# Patient Record
Sex: Female | Born: 2000 | Hispanic: Yes | Marital: Single | Smoking: Never smoker
Health system: Southern US, Community
[De-identification: ages and names within clinical notes are randomized; demographics above are authoritative.]

## PROBLEM LIST (undated history)

## (undated) DIAGNOSIS — O139 Gestational [pregnancy-induced] hypertension without significant proteinuria, unspecified trimester: Secondary | ICD-10-CM

## (undated) DIAGNOSIS — E669 Obesity, unspecified: Secondary | ICD-10-CM

## (undated) HISTORY — PX: FRACTURE SURGERY: SHX138

---
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
## 2018-10-25 ENCOUNTER — Encounter: Payer: Self-pay | Admitting: Pediatrics

## 2019-01-03 ENCOUNTER — Encounter: Payer: Self-pay | Admitting: Pediatrics

## 2019-01-03 ENCOUNTER — Ambulatory Visit (INDEPENDENT_AMBULATORY_CARE_PROVIDER_SITE_OTHER): Payer: Medicaid Other | Admitting: Pediatrics

## 2019-01-03 VITALS — BP 108/70 | Ht 63.0 in | Wt 219.2 lb

## 2019-01-03 DIAGNOSIS — Z0000 Encounter for general adult medical examination without abnormal findings: Secondary | ICD-10-CM | POA: Diagnosis not present

## 2019-01-03 DIAGNOSIS — E669 Obesity, unspecified: Secondary | ICD-10-CM | POA: Diagnosis not present

## 2019-01-03 DIAGNOSIS — Z00129 Encounter for routine child health examination without abnormal findings: Secondary | ICD-10-CM

## 2019-01-03 DIAGNOSIS — Z68.41 Body mass index (BMI) pediatric, greater than or equal to 95th percentile for age: Secondary | ICD-10-CM

## 2019-01-03 NOTE — Progress Notes (Signed)
Adolescent Well Care Visit Kim Mejia is a 18 y.o. female who is here for well care.    PCP:  Richrd Sox, MD   History was provided by the patient.  Confidentiality was discussed with the patient and, if applicable, with caregiver as well. Patient's personal or confidential phone number:    Current Issues: Current concerns include none today .   Nutrition: Nutrition/Eating Behaviors: eats a lot of fast food. Drinks sodas and snacks. 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  no Stressors of note: no  Education: School Name: no school she is hoping to attend phlebotomy.   Menstruation:   Patient's last menstrual period was 12/18/2018 (within months). Menstrual History: was irregular last month but never before    Confidential Social History: Tobacco?  no Secondhand smoke exposure?  no Drugs/ETOH?  no  Sexually Active?  no   Pregnancy Prevention: no sex  Safe at home, in school & in relationships?  Yes Safe to self?  Yes   Screenings: Patient has a dental home: yes  The patient completed the Rapid Assessment for Adolescent Preventive Services screening questionnaire and the following topics were identified as risk factors and discussed: healthy eating, exercise, seatbelt use, bullying, tobacco use, marijuana use, drug use, birth control, suicidality/self harm and mental health issues  In addition, the following topics were discussed as part of anticipatory guidance healthy eating, exercise and abuse/trauma.  PHQ-9 completed and results indicated no concerns   Physical Exam:  Vitals:   01/03/19 1102   BP: 108/70  Weight: 219 lb 3.2 oz (99.4 kg)  Height: 5\' 3"  (1.6 m)   BP 108/70   Ht 5\' 3"  (1.6 m)   Wt 219 lb 3.2 oz (99.4 kg)   LMP 12/18/2018 (Within Months)   BMI 38.83 kg/m  Body mass index: body mass index is 38.83 kg/m. Blood pressure percentiles are not available for patients who are 18 years or older.   Hearing Screening   125Hz  250Hz  500Hz  1000Hz  2000Hz  3000Hz  4000Hz  6000Hz  8000Hz   Right ear:   20 20 20 20 20     Left ear:   20 20 20 20 20       Visual Acuity Screening   Right eye Left eye Both eyes  Without correction: 20/20 20/25   With correction:       General Appearance:   alert, oriented, no acute distress  HENT: Normocephalic, no obvious abnormality, conjunctiva clear  Mouth:   Normal appearing teeth, no obvious discoloration, dental caries, or dental caps  Neck:   Supple; thyroid: no enlargement, symmetric, no tenderness/mass/nodules  Chest No masses in breasts  Lungs:   Clear to auscultation bilaterally, normal work of breathing  Heart:   Regular rate and rhythm, S1 and S2 normal, no murmurs;   Abdomen:   Soft, non-tender, no mass, or organomegaly  GU genitalia not examined  Musculoskeletal:   Tone and strength strong and symmetrical, all extremities               Lymphatic:   No cervical adenopathy  Skin/Hair/Nails:   Skin warm, dry and intact, no rashes, no bruises or  petechiae  Neurologic:   Strength, gait, and coordination normal and age-appropriate     Assessment and Plan:  18 yo here for a physical with obesity   BMI is not appropriate for age  Hearing screening result:normal Vision screening result: normal  Counseling provided for all of the vaccine components she is to go to the health department for her vaccines. We discussed this today.  Orders Placed This Encounter  Procedures  . GC/Chlamydia Probe Amp([facility])     Return in 1 year (on 01/04/2020)..  Obesity  Counseling about lifestyle change and eating habits. Treating once a week  no seconds. Exercise 30 minutes a day for 4-5 days a week.  Sleeping 8 hours a night.  Drinking more water.   Richrd Sox, MD

## 2019-01-03 NOTE — Patient Instructions (Signed)
  Place appropriate patient instructions here. 

## 2019-01-05 LAB — GC/CHLAMYDIA PROBE AMP
Chlamydia trachomatis, NAA: NEGATIVE
Neisseria gonorrhoeae by PCR: NEGATIVE

## 2019-04-03 ENCOUNTER — Other Ambulatory Visit: Payer: Self-pay

## 2019-04-03 ENCOUNTER — Emergency Department (HOSPITAL_COMMUNITY)
Admission: EM | Admit: 2019-04-03 | Discharge: 2019-04-03 | Disposition: A | Discharge status: Home / Self Care | Payer: No Typology Code available for payment source | Attending: Emergency Medicine | Admitting: Emergency Medicine

## 2019-04-03 ENCOUNTER — Emergency Department (HOSPITAL_COMMUNITY): Payer: No Typology Code available for payment source

## 2019-04-03 ENCOUNTER — Encounter (HOSPITAL_COMMUNITY): Payer: Self-pay

## 2019-04-03 DIAGNOSIS — Y9389 Activity, other specified: Secondary | ICD-10-CM | POA: Diagnosis not present

## 2019-04-03 DIAGNOSIS — R410 Disorientation, unspecified: Secondary | ICD-10-CM | POA: Insufficient documentation

## 2019-04-03 DIAGNOSIS — Y998 Other external cause status: Secondary | ICD-10-CM | POA: Insufficient documentation

## 2019-04-03 DIAGNOSIS — R51 Headache: Secondary | ICD-10-CM | POA: Diagnosis not present

## 2019-04-03 DIAGNOSIS — Y92410 Unspecified street and highway as the place of occurrence of the external cause: Secondary | ICD-10-CM | POA: Insufficient documentation

## 2019-04-03 DIAGNOSIS — S0990XA Unspecified injury of head, initial encounter: Secondary | ICD-10-CM | POA: Diagnosis not present

## 2019-04-03 DIAGNOSIS — R42 Dizziness and giddiness: Secondary | ICD-10-CM | POA: Diagnosis not present

## 2019-04-03 DIAGNOSIS — Z7722 Contact with and (suspected) exposure to environmental tobacco smoke (acute) (chronic): Secondary | ICD-10-CM | POA: Diagnosis not present

## 2019-04-03 LAB — I-STAT BETA HCG BLOOD, ED (MC, WL, AP ONLY): I-stat hCG, quantitative: <5 m[IU]/mL (ref <5)

## 2019-04-03 MED ORDER — ACETAMINOPHEN 325 MG PO TABS
650.0000 mg | ORAL_TABLET | Freq: Once | ORAL | Status: AC
  Administered 2019-04-03: 650 mg via ORAL
  Filled 2019-04-03: qty 2
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
## 2019-04-30 ENCOUNTER — Encounter (HOSPITAL_COMMUNITY): Payer: Self-pay

## 2019-04-30 ENCOUNTER — Ambulatory Visit (HOSPITAL_COMMUNITY)
Admission: EM | Admit: 2019-04-30 | Discharge: 2019-04-30 | Disposition: A | Discharge status: Home / Self Care | Payer: Medicaid Other | Attending: Family Medicine | Admitting: Family Medicine

## 2019-04-30 ENCOUNTER — Other Ambulatory Visit: Payer: Self-pay

## 2019-04-30 DIAGNOSIS — N760 Acute vaginitis: Secondary | ICD-10-CM

## 2019-04-30 DIAGNOSIS — Z3202 Encounter for pregnancy test, result negative: Secondary | ICD-10-CM

## 2019-04-30 LAB — POCT URINALYSIS DIP (DEVICE)
Bilirubin Urine: NEGATIVE
Glucose, UA: NEGATIVE mg/dL
Hgb urine dipstick: NEGATIVE
Ketones, ur: NEGATIVE mg/dL
Nitrite: NEGATIVE
Protein, ur: NEGATIVE mg/dL
Specific Gravity, Urine: >=1.03 (ref 1.005–1.030)
Urobilinogen, UA: 0.2 mg/dL (ref 0.0–1.0)
pH: 6.5 (ref 5.0–8.0)

## 2019-04-30 LAB — POCT PREGNANCY, URINE: Preg Test, Ur: NEGATIVE

## 2019-04-30 MED ORDER — FLUCONAZOLE 150 MG PO TABS: 150.0000 mg | ORAL_TABLET | Freq: Every day | ORAL | 0 refills | Status: DC

## 2019-04-30 MED ORDER — METRONIDAZOLE 500 MG PO TABS: 500.0000 mg | ORAL_TABLET | Freq: Two times a day (BID) | ORAL | 0 refills | Status: DC

## 2019-04-30 NOTE — Discharge Instructions (Addendum)
Take the metronidazole 2 x a day for a week Take the diflucan now, and repeat in one week We have done additional testing and will call you if anything is positive

## 2019-04-30 NOTE — ED Triage Notes (Signed)
Patient presents to Urgent Care with complaints of vaginal itching since 4 days ago. Patient reports she has had unprotected sex recently, took pregnancy test and it was negative, period is supposed to come this week but has not yet.

## 2019-04-30 NOTE — ED Provider Notes (Signed)
[facility]    CSN: [id number] Arrival date & time: 04/30/19  [id number]     History   Chief Complaint Chief Complaint  Patient presents with  . Appointment  . Vaginal Itching    HPI Kim Mejia is a 18 y.o. female.   HPI  Patient is here for vaginitis.  She has vaginal itching.  No rash or irritation.  She had sexual relations unprotected.  It is with a new boyfriend.  In spite of that she thinks advised to get STD testing.  She declines RPR and HIV against my advice.  It is that she is never had a yeast infection.  States she is never had an STD.  Discussed the importance of safe sex.  Birth control.  Past Medical History:  Diagnosis Date  . Obesity     Patient Active Problem List   Diagnosis Date Noted  . Closed fracture of distal fibula 03/10/2017  . 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 BMI (body mass index), pediatric, 95-99% for age 06/21/2015  . Language barrier 03/21/2015  . Pain of left calf 08/21/2013    Past Surgical History:  Procedure Laterality Date  . ORIF FIBULA FRACTURE Right 03/14/2017   Procedure: RIGHT OPEN REDUCTION INTERNAL FIXATION LATERAL MALLEOLUS;  Surgeon: Eldred MangesMark C Yates, MD;  [facility];  Service: Orthopedics;  Laterality: Right;    OB History   No obstetric history on file.      Home Medications    Prior to Admission medications   Medication Sig Start Date End Date Taking? Authorizing Provider  fluconazole (DIFLUCAN) 150 MG tablet Take 1 tablet (150 mg total) by mouth daily. Repeat in 1 week if needed 04/30/19   Eustace MooreNelson, Sarissa Dern Sue, MD  metroNIDAZOLE (FLAGYL) 500 MG tablet Take 1 tablet (500 mg total) by mouth 2 (two) times daily. 04/30/19   Eustace MooreNelson, Arkin Imran Sue, MD    Family History Family History  Problem Relation Age of Onset  . Hyperlipidemia Mother     Social History Social History   Tobacco Use  . Smoking status: Passive Smoke Exposure - Never Smoker  . Smokeless tobacco: Never Used  . Tobacco  comment: father smokes, visits on weekends  Substance Use Topics  . Alcohol use: No    Alcohol/week: 0.0 standard drinks  . Drug use: No     Allergies   No known allergies   Review of Systems Review of Systems  Constitutional: Negative for chills and fever.  HENT: Negative for ear pain and sore throat.   Eyes: Negative for pain and visual disturbance.  Respiratory: Negative for cough and shortness of breath.   Cardiovascular: Negative for chest pain and palpitations.  Gastrointestinal: Negative for abdominal pain and vomiting.  Genitourinary: Positive for vaginal discharge. Negative for dysuria and hematuria.  Musculoskeletal: Negative for arthralgias and back pain.  Skin: Negative for color change and rash.  Neurological: Negative for seizures and syncope.  All other systems reviewed and are negative.    Physical Exam Triage Vital Signs ED Triage Vitals  Enc Vitals Group     BP 04/30/19 1025 (!) 177/84     Pulse Rate 04/30/19 1025 76     Resp 04/30/19 1025 17     Temp 04/30/19 1025 98.6 F (37 C)     Temp Source 04/30/19 1025 Oral     SpO2 04/30/19 1025 99 %   No data found.  Updated Vital Signs BP (!) 177/84 (BP Location: Left Arm)  Pulse 76   Temp 98.6 F (37 C) (Oral)   Resp 17   LMP 03/30/2019 (Exact Date)   SpO2 99%   Physical Exam Vitals signs and nursing note reviewed.  Constitutional:      General: She is not in acute distress.    Appearance: She is well-developed.  HENT:     Head: Normocephalic and atraumatic.  Eyes:     Conjunctiva/sclera: Conjunctivae normal.  Neck:     Musculoskeletal: Neck supple.  Cardiovascular:     Rate and Rhythm: Normal rate and regular rhythm.     Heart sounds: No murmur.  Pulmonary:     Effort: Pulmonary effort is normal. No respiratory distress.     Breath sounds: Normal breath sounds.  Abdominal:     Palpations: Abdomen is soft.     Tenderness: There is no abdominal tenderness.  Genitourinary:     Comments: Exam deferred Skin:    General: Skin is warm and dry.  Neurological:     General: No focal deficit present.     Mental Status: She is alert.  Psychiatric:        Mood and Affect: Mood normal.      UC Treatments / Results  Labs (all labs ordered are listed, but only abnormal results are displayed) Labs Reviewed  POCT URINALYSIS DIP (DEVICE) - Abnormal; Notable for the following components:      Result Value   Leukocytes,Ua TRACE (*)    All other components within normal limits  POCT PREGNANCY, URINE  POC URINE PREG, ED  CERVICOVAGINAL ANCILLARY ONLY    EKG None  Radiology No results found.  Procedures Procedures (including critical care time)  Medications Ordered in UC Medications - No data to display  Initial Impression / Assessment and Plan / UC Course  I have reviewed the triage vital signs and the nursing notes.  Pertinent labs & imaging results that were available during my care of the patient were reviewed by me and considered in my medical decision making (see chart for details).      Final Clinical Impressions(s) / UC Diagnoses   Final diagnoses:  Vaginitis and vulvovaginitis     Discharge Instructions     Take the metronidazole 2 x a day for a week Take the diflucan now, and repeat in one week We have done additional testing and will call you if anything is positive   ED Prescriptions    Medication Sig Dispense Auth. Provider   metroNIDAZOLE (FLAGYL) 500 MG tablet Take 1 tablet (500 mg total) by mouth 2 (two) times daily. 14 tablet Eustace Moore, MD   fluconazole (DIFLUCAN) 150 MG tablet Take 1 tablet (150 mg total) by mouth daily. Repeat in 1 week if needed 2 tablet Eustace Moore, MD     Controlled Substance Prescriptions [place] Controlled Substance Registry consulted? Not Applicable   Eustace Moore, MD 04/30/19 1531

## 2019-05-01 LAB — CERVICOVAGINAL ANCILLARY ONLY
Chlamydia: NEGATIVE
Neisseria Gonorrhea: NEGATIVE
Trichomonas: NEGATIVE

## 2019-06-15 DIAGNOSIS — Z20828 Contact with and (suspected) exposure to other viral communicable diseases: Secondary | ICD-10-CM | POA: Diagnosis not present

## 2019-07-05 ENCOUNTER — Other Ambulatory Visit: Payer: Self-pay

## 2019-07-05 ENCOUNTER — Ambulatory Visit (INDEPENDENT_AMBULATORY_CARE_PROVIDER_SITE_OTHER): Payer: Medicaid Other | Admitting: Pediatrics

## 2019-07-05 ENCOUNTER — Encounter: Payer: Self-pay | Admitting: Pediatrics

## 2019-07-05 VITALS — BP 112/72 | Ht 63.0 in | Wt 227.0 lb

## 2019-07-05 DIAGNOSIS — L83 Acanthosis nigricans: Secondary | ICD-10-CM

## 2019-07-06 ENCOUNTER — Ambulatory Visit: Payer: Medicaid Other | Admitting: Pediatrics

## 2019-07-06 ENCOUNTER — Encounter: Payer: Self-pay | Admitting: Pediatrics

## 2019-07-06 NOTE — Progress Notes (Signed)
She is here for a weight check. Her weight has increased since her last visit. There have been no changes in lifestyle. She denies excessive thirst, eating, and abdominal pain. No vomiting.    No distress, obese female  Acanthosis nigricans mild on neck  Heart sounds normal, RRR No thyroid palpated  Lungs are clear    18 yo with obesity  Referral to endocrine today  Encouraged lifestyle change No lab work today.  Follow up as needed

## 2019-07-11 ENCOUNTER — Ambulatory Visit (HOSPITAL_COMMUNITY)
Admission: EM | Admit: 2019-07-11 | Discharge: 2019-07-11 | Disposition: A | Discharge status: Home / Self Care | Payer: Medicaid Other | Attending: Urgent Care | Admitting: Urgent Care

## 2019-07-11 ENCOUNTER — Other Ambulatory Visit: Payer: Self-pay

## 2019-07-11 ENCOUNTER — Encounter (HOSPITAL_COMMUNITY): Payer: Self-pay | Admitting: Urgent Care

## 2019-07-11 DIAGNOSIS — R0789 Other chest pain: Secondary | ICD-10-CM

## 2019-07-11 MED ORDER — OMEPRAZOLE 20 MG PO CPDR: 20.0000 mg | DELAYED_RELEASE_CAPSULE | Freq: Every day | ORAL | 0 refills | Status: DC

## 2019-07-11 MED ORDER — FAMOTIDINE 20 MG PO TABS: 20.0000 mg | ORAL_TABLET | Freq: Two times a day (BID) | ORAL | 0 refills | Status: DC

## 2019-07-11 NOTE — ED Triage Notes (Signed)
PT reports central chest pain that sometimes radiates to right shoulder. No pain during triage.

## 2019-07-11 NOTE — ED Notes (Signed)
Patient currently being registered by patient access

## 2019-07-11 NOTE — ED Provider Notes (Addendum)
MRN: [id number] DOB: [birth year]  Subjective:   Kim Mejia is a 18 y.o. female presenting for 2 week history of mid-sternal to right upper sided chest pain that is like a "tightness". Pain is intermittent, daily, mild in nature. Has not tried medications for relief. 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 Does not eat out at restaurants. Denies smoking cigarettes. Denies chronic conditions.    Denies taking any chronic medications.     Allergies  Allergen Reactions  . No Known Allergies     Past Medical History:  Diagnosis Date  . Obesity      Past Surgical History:  Procedure Laterality Date  . ORIF FIBULA FRACTURE Right 03/14/2017   Procedure: RIGHT OPEN REDUCTION INTERNAL FIXATION LATERAL MALLEOLUS;  Surgeon: Marybelle Killings, MD;  Location: [facility];  Service: Orthopedics;  Laterality: Right;    Review of Systems  Constitutional: Negative for fever and malaise/fatigue.  HENT: Negative for congestion, ear pain, sinus pain and sore throat.   Eyes: Negative for blurred vision, double vision, discharge and redness.  Respiratory: Negative for cough, hemoptysis, shortness of breath and wheezing.   Cardiovascular: Positive for chest pain. Negative for palpitations and leg swelling.  Gastrointestinal: Negative for abdominal pain, diarrhea, nausea and vomiting.  Genitourinary: Negative for dysuria, flank pain, frequency, hematuria and urgency.  Musculoskeletal: Negative for back pain and myalgias.  Skin: Negative for rash.  Neurological: Negative for dizziness, weakness and headaches.  Psychiatric/Behavioral: Negative for depression and substance abuse.    Objective:   Vitals: BP 107/69   Pulse 70   Temp 98.6 F (37 C) (Oral)   Resp 16   SpO2 98%   Physical Exam Constitutional:      General: She is not in acute distress.    Appearance: Normal appearance. She is well-developed and normal weight. She is not ill-appearing, toxic-appearing or diaphoretic.  HENT:     Head: Normocephalic  and atraumatic.     Right Ear: External ear normal.     Left Ear: External ear normal.     Nose: Nose normal.     Mouth/Throat:     Mouth: Mucous membranes are moist.     Pharynx: Oropharynx is clear.  Eyes:     General: No scleral icterus.    Extraocular Movements: Extraocular movements intact.     Pupils: Pupils are equal, round, and reactive to light.  Cardiovascular:     Rate and Rhythm: Normal rate and regular rhythm.     Pulses: Normal pulses.     Heart sounds: Normal heart sounds. No murmur. No friction rub. No gallop.   Pulmonary:     Effort: Pulmonary effort is normal. No respiratory distress.     Breath sounds: Normal breath sounds. No stridor. No wheezing, rhonchi or rales.  Abdominal:     General: Bowel sounds are normal. There is no distension.     Palpations: Abdomen is soft. There is no mass.     Tenderness: There is no abdominal tenderness. There is no right CVA tenderness, left CVA tenderness, guarding or rebound.  Skin:    General: Skin is warm and dry.     Coloration: Skin is not pale.     Findings: No rash.  Neurological:     General: No focal deficit present.     Mental Status: She is alert and oriented to person, place, and time.  Psychiatric:        Mood and Affect: Mood normal.        Behavior: Behavior  normal.        Thought Content: Thought content normal.        Judgment: Judgment normal.     Assessment and Plan :   1. Atypical chest pain     Patient has minimal risk factors for cardiopulmonary process.  Counseled that we will manage this for acid reflux with combination of Pepcid and Prilosec which I recommend patient will take more long-term.  Also recommended dietary modifications. Counseled patient on potential for adverse effects with medications prescribed/recommended today, ER and return-to-clinic precautions discussed, patient verbalized understanding.    Wallis BambergMani, Taquan Bralley, [place] JerseyPA-C 07/11/19 1333

## 2019-08-15 ENCOUNTER — Encounter (HOSPITAL_COMMUNITY): Payer: Self-pay | Admitting: Emergency Medicine

## 2019-08-15 ENCOUNTER — Ambulatory Visit (HOSPITAL_COMMUNITY)
Admission: EM | Admit: 2019-08-15 | Discharge: 2019-08-15 | Disposition: A | Discharge status: Home / Self Care | Payer: Medicaid Other | Attending: Family Medicine | Admitting: Family Medicine

## 2019-08-15 ENCOUNTER — Other Ambulatory Visit: Payer: Self-pay

## 2019-08-15 DIAGNOSIS — S46912A Strain of unspecified muscle, fascia and tendon at shoulder and upper arm level, left arm, initial encounter: Secondary | ICD-10-CM | POA: Diagnosis not present

## 2019-08-15 MED ORDER — CYCLOBENZAPRINE HCL 10 MG PO TABS: 10.0000 mg | ORAL_TABLET | Freq: Three times a day (TID) | ORAL | 0 refills | Status: DC | PRN

## 2019-08-15 NOTE — ED Triage Notes (Signed)
Pt here for left shoulder pain upon waking today

## 2019-08-15 NOTE — ED Provider Notes (Signed)
[facility]    CSN: [id number] Arrival date & time: 08/15/19  1800      History   Chief Complaint Chief Complaint  Patient presents with  . Appointment    5:50  . Shoulder Pain    HPI Kim Mejia is a 18 y.o. female.   Patient complains of pain in her left shoulder but upon questioning her pain is primarily in the muscle between her neck and shoulder, the trapezius muscle.  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  There is been no history of any injury or trauma or other unusual activity.  HPI  Past Medical History:  Diagnosis Date  . Obesity     Patient Active Problem List   Diagnosis Date Noted  . Closed fracture of distal fibula 03/10/2017  . 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 BMI (body mass index), pediatric, 95-99% for age 53/25/2016  . Language barrier 03/21/2015  . Pain of left calf 08/21/2013    Past Surgical History:  Procedure Laterality Date  . ORIF FIBULA FRACTURE Right 03/14/2017   Procedure: RIGHT OPEN REDUCTION INTERNAL FIXATION LATERAL MALLEOLUS;  Surgeon: Eldred MangesMark C Yates, MD;  Location[facility];  Service: Orthopedics;  Laterality: Right;    OB History   No obstetric history on file.      Home Medications    Prior to Admission medications   Medication Sig Start Date End Date Taking? Authorizing Provider  famotidine (PEPCID) 20 MG tablet Take 1 tablet (20 mg total) by mouth 2 (two) times daily. 07/11/19   Wallis BambergMani, Mario, PA-C  omeprazole (PRILOSEC) 20 MG capsule Take 1 capsule (20 mg total) by mouth daily. 07/11/19   Wallis BambergMani, Mario, PA-C    Family History Family History  Problem Relation Age of Onset  . Hyperlipidemia Mother     Social History Social History   Tobacco Use  . Smoking status: Passive Smoke Exposure - Never Smoker  . Smokeless tobacco: Never Used  . Tobacco comment: father smokes, visits on weekends  Substance Use Topics  . Alcohol use: No    Alcohol/week: 0.0 standard drinks  . Drug use: No      Allergies   No known allergies   Review of Systems Review of Systems  Musculoskeletal: Positive for myalgias.  All other systems reviewed and are negative.    Physical Exam Triage Vital Signs ED Triage Vitals [08/15/19 1813]  Enc Vitals Group     BP 130/87     Pulse Rate 84     Resp 18     Temp 98.7 F (37.1 C)     Temp Source Oral     SpO2 99 %     Weight      Height      Head Circumference      Peak Flow      Pain Score 7     Pain Loc      Pain Edu?      Excl. in GC?    No data found.  Updated Vital Signs BP 130/87 (BP Location: Left Arm)   Pulse 84   Temp 98.7 F (37.1 C) (Oral)   Resp 18   SpO2 99%   Visual Acuity Right Eye Distance:   Left Eye Distance:   Bilateral Distance:    Right Eye Near:   Left Eye Near:    Bilateral Near:     Physical Exam Constitutional:      Appearance: Normal appearance. She is  normal weight.  Musculoskeletal:     Comments: Left shoulder: Normal contour Normal range of motion and strength.  Tenderness maximal in the left trapezius muscle going into her neck.  Reflexes are symmetric in the upper extremities but there is some suggestion of possible early carpal tunnel syndrome on the left.  Neurological:     Mental Status: She is alert.      UC Treatments / Results  Labs (all labs ordered are listed, but only abnormal results are displayed) Labs Reviewed - No data to display  EKG   Radiology No results found.  Procedures Procedures (including critical care time)  Medications Ordered in UC Medications - No data to display  Initial Impression / Assessment and Plan / UC Course  I have reviewed the triage vital signs and the nursing notes.  Pertinent labs & imaging results that were available during my care of the patient were reviewed by me and considered in my medical decision making (see chart for details).     Left trapezius muscle strain.  Have recommended Biofreeze massage along with isometric  exercises and will prescribe muscle relaxant Final Clinical Impressions(s) / UC Diagnoses   Final diagnoses:  None   Discharge Instructions   None    ED Prescriptions    None     Controlled Substance Prescriptions [place] Controlled Substance Registry consulted? No   Wardell Honour, MD 08/15/19 1901

## 2019-09-14 DIAGNOSIS — Z3009 Encounter for other general counseling and advice on contraception: Secondary | ICD-10-CM | POA: Diagnosis not present

## 2019-09-14 DIAGNOSIS — Z3201 Encounter for pregnancy test, result positive: Secondary | ICD-10-CM | POA: Diagnosis not present

## 2019-09-14 DIAGNOSIS — Z113 Encounter for screening for infections with a predominantly sexual mode of transmission: Secondary | ICD-10-CM | POA: Diagnosis not present

## 2019-09-25 ENCOUNTER — Inpatient Hospital Stay (HOSPITAL_COMMUNITY)
Admission: EM | Admit: 2019-09-25 | Discharge: 2019-09-25 | Disposition: A | Discharge status: Home / Self Care | Payer: Medicaid Other | Attending: Family Medicine | Admitting: Family Medicine

## 2019-09-25 ENCOUNTER — Encounter (HOSPITAL_COMMUNITY): Payer: Self-pay

## 2019-09-25 ENCOUNTER — Inpatient Hospital Stay (HOSPITAL_COMMUNITY): Payer: Medicaid Other

## 2019-09-25 ENCOUNTER — Other Ambulatory Visit: Payer: Self-pay

## 2019-09-25 DIAGNOSIS — R109 Unspecified abdominal pain: Secondary | ICD-10-CM | POA: Insufficient documentation

## 2019-09-25 DIAGNOSIS — O9989 Other specified diseases and conditions complicating pregnancy, childbirth and the puerperium: Secondary | ICD-10-CM | POA: Insufficient documentation

## 2019-09-25 DIAGNOSIS — O3680X0 Pregnancy with inconclusive fetal viability, not applicable or unspecified: Secondary | ICD-10-CM

## 2019-09-25 DIAGNOSIS — O209 Hemorrhage in early pregnancy, unspecified: Secondary | ICD-10-CM | POA: Diagnosis not present

## 2019-09-25 DIAGNOSIS — O99211 Obesity complicating pregnancy, first trimester: Secondary | ICD-10-CM | POA: Diagnosis not present

## 2019-09-25 DIAGNOSIS — O4690 Antepartum hemorrhage, unspecified, unspecified trimester: Secondary | ICD-10-CM

## 2019-09-25 DIAGNOSIS — Z3A08 8 weeks gestation of pregnancy: Secondary | ICD-10-CM | POA: Diagnosis not present

## 2019-09-25 DIAGNOSIS — Z79899 Other long term (current) drug therapy: Secondary | ICD-10-CM | POA: Diagnosis not present

## 2019-09-25 DIAGNOSIS — E669 Obesity, unspecified: Secondary | ICD-10-CM | POA: Insufficient documentation

## 2019-09-25 DIAGNOSIS — Z3A01 Less than 8 weeks gestation of pregnancy: Secondary | ICD-10-CM | POA: Diagnosis not present

## 2019-09-25 DIAGNOSIS — O4691 Antepartum hemorrhage, unspecified, first trimester: Secondary | ICD-10-CM

## 2019-09-25 LAB — CBC
HCT: 35.9 % — ABNORMAL LOW (ref 36.0–46.0)
Hemoglobin: 11.9 g/dL — ABNORMAL LOW (ref 12.0–15.0)
MCH: 29.7 pg (ref 26.0–34.0)
MCHC: 33.1 g/dL (ref 30.0–36.0)
MCV: 89.5 fL (ref 80.0–100.0)
Platelets: 317 10*3/uL (ref 150–400)
RBC: 4.01 MIL/uL (ref 3.87–5.11)
RDW: 13 % (ref 11.5–15.5)
WBC: 12.5 10*3/uL — ABNORMAL HIGH (ref 4.0–10.5)
nRBC: 0 % (ref 0.0–0.2)

## 2019-09-25 LAB — URINALYSIS, ROUTINE W REFLEX MICROSCOPIC
Bilirubin Urine: NEGATIVE
Glucose, UA: NEGATIVE mg/dL
Ketones, ur: NEGATIVE mg/dL
Leukocytes,Ua: NEGATIVE
Nitrite: NEGATIVE
Protein, ur: 30 mg/dL — AB
Specific Gravity, Urine: 1.03 (ref 1.005–1.030)
pH: 5 (ref 5.0–8.0)

## 2019-09-25 LAB — ABO/RH: ABO/RH(D): O POS

## 2019-09-25 LAB — WET PREP, GENITAL
Clue Cells Wet Prep HPF POC: NONE SEEN
Sperm: NONE SEEN
Trich, Wet Prep: NONE SEEN
Yeast Wet Prep HPF POC: NONE SEEN

## 2019-09-25 LAB — POCT PREGNANCY, URINE: Preg Test, Ur: POSITIVE — AB

## 2019-09-25 NOTE — MAU Note (Signed)
Pt reports she was laying down at 2030 and noticed she was having period like bleeding. No recent falls. Last intercourse was 9/26.

## 2019-09-25 NOTE — MAU Provider Note (Signed)
History     CSN: [id number]  Arrival date and time: 09/25/19 2053   First Provider Initiated Contact with Patient 09/25/19 2216      Chief Complaint  Patient presents with  . Vaginal Bleeding    Kim Mejia is a 18 y.o. [name] at [redacted]w[redacted]d by unsure of LMP of July 31, 2019 who has not established care.  She presents today for Vaginal Bleeding.  She states she was laying down and felt wetness and noted blood.  She states this occurred around 830pm and she continued to note blood upon arrival.  Patient reports the blood was initially dark red, but is now more pinkish red with an increased amt. Patient denies any cramping, but reports some "shocking" pain on her right side that is intermittent that she rates as 2/10.  Patient endorses sexual activity on the 26th and denies pain and bleeding after intercourse.       OB History    Gravida  1   Para      Term      Preterm      AB      Living        SAB      TAB      Ectopic      Multiple      Live Births              Past Medical History:  Diagnosis Date  . Obesity     Past Surgical History:  Procedure Laterality Date  . ORIF FIBULA FRACTURE Right 03/14/2017   Procedure: RIGHT OPEN REDUCTION INTERNAL FIXATION LATERAL MALLEOLUS;  Surgeon: Marybelle Killings, MD;  Location: [facility];  Service: Orthopedics;  Laterality: Right;    Family History  Problem Relation Age of Onset  . Hyperlipidemia Mother     Social History   Tobacco Use  . Smoking status: Never Smoker  . Smokeless tobacco: Never Used  . Tobacco comment: father smokes, visits on weekends  Substance Use Topics  . Alcohol use: No    Alcohol/week: 0.0 standard drinks  . Drug use: No    Allergies:  Allergies  Allergen Reactions  . No Known Allergies     Medications Prior to Admission  Medication Sig Dispense Refill Last Dose  . Prenatal Vit-Fe Fumarate-FA (MULTIVITAMIN-PRENATAL) 27-0.8 MG TABS tablet Take 1 tablet by mouth daily at 12 noon.    09/25/2019 at Unknown time  . cyclobenzaprine (FLEXERIL) 10 MG tablet Take 1 tablet (10 mg total) by mouth 3 (three) times daily as needed for muscle spasms. 20 tablet 0   . famotidine (PEPCID) 20 MG tablet Take 1 tablet (20 mg total) by mouth 2 (two) times daily. 60 tablet 0   . omeprazole (PRILOSEC) 20 MG capsule Take 1 capsule (20 mg total) by mouth daily. 90 capsule 0     Review of Systems  Constitutional: Negative for chills and fever.  Respiratory: Negative for cough and shortness of breath.   Gastrointestinal: Positive for abdominal pain (Right Side), nausea and vomiting. Negative for constipation and diarrhea.  Genitourinary: Positive for vaginal bleeding. Negative for difficulty urinating, dysuria, pelvic pain and vaginal discharge.  Neurological: Negative for dizziness, light-headedness and headaches.   Physical Exam   Blood pressure 118/70, pulse 74, temperature 98.2 F (36.8 C), temperature source Oral, resp. rate 17, last menstrual period 07/31/2019, SpO2 100 %.  Physical Exam  Constitutional: She is oriented to person, place, and time.  Obese  HENT:  Head: Normocephalic and  atraumatic.  Eyes: Conjunctivae are normal.  Neck: Normal range of motion.  Cardiovascular: Normal rate.  Respiratory: Effort normal.  GI: Soft.  Genitourinary: Cervix exhibits no friability.    Vaginal bleeding present.  There is bleeding in the vagina.    Genitourinary Comments:  Speculum Exam: -Normal External Genitalia: Non tender, Scant amt bloody mucoid discharge noted at introitus.  -Vaginal Vault: Pink mucosa with good rugae. Scant amt dark red blood -wet prep collected -Cervix:Pink, no lesions, cysts, or polyps.  Appears closed. Blood noted at os-GC/CT collected -Bimanual Exam:  Closed   Neurological: She is alert and oriented to person, place, and time.  Skin: Skin is warm and dry.  Psychiatric: She has a normal mood and affect. Her behavior is normal.    MAU Course   Procedures Results for orders placed or performed during the hospital encounter of 09/25/19 (from the past 24 hour(s))  Urinalysis, Routine w reflex microscopic     Status: Abnormal   Collection Time: 09/25/19  9:45 PM  Result Value Ref Range   Color, Urine YELLOW YELLOW   APPearance CLEAR CLEAR   Specific Gravity, Urine 1.030 1.005 - 1.030   pH 5.0 5.0 - 8.0   Glucose, UA NEGATIVE NEGATIVE mg/dL   Hgb urine dipstick LARGE (A) NEGATIVE   Bilirubin Urine NEGATIVE NEGATIVE   Ketones, ur NEGATIVE NEGATIVE mg/dL   Protein, ur 30 (A) NEGATIVE mg/dL   Nitrite NEGATIVE NEGATIVE   Leukocytes,Ua NEGATIVE NEGATIVE   RBC / HPF 6-10 0 - 5 RBC/hpf   WBC, UA 0-5 0 - 5 WBC/hpf   Bacteria, UA RARE (A) NONE SEEN   Squamous Epithelial / LPF 0-5 0 - 5   Mucus PRESENT   Pregnancy, urine POC     Status: Abnormal   Collection Time: 09/25/19  9:54 PM  Result Value Ref Range   Preg Test, Ur POSITIVE (A) NEGATIVE  Wet prep, genital     Status: Abnormal   Collection Time: 09/25/19 10:32 PM   Specimen: Cervical/Vaginal swab  Result Value Ref Range   Yeast Wet Prep HPF POC NONE SEEN NONE SEEN   Trich, Wet Prep NONE SEEN NONE SEEN   Clue Cells Wet Prep HPF POC NONE SEEN NONE SEEN   WBC, Wet Prep HPF POC FEW (A) NONE SEEN   Sperm NONE SEEN   CBC     Status: Abnormal   Collection Time: 09/25/19 10:43 PM  Result Value Ref Range   WBC 12.5 (H) 4.0 - 10.5 K/uL   RBC 4.01 3.87 - 5.11 MIL/uL   Hemoglobin 11.9 (L) 12.0 - 15.0 g/dL   HCT 58.0 (L) 99.8 - 33.8 %   MCV 89.5 80.0 - 100.0 fL   MCH 29.7 26.0 - 34.0 pg   MCHC 33.1 30.0 - 36.0 g/dL   RDW 25.0 53.9 - 76.7 %   Platelets 317 150 - 400 K/uL   nRBC 0.0 0.0 - 0.2 %  ABO/Rh     Status: None   Collection Time: 09/25/19 10:43 PM  Result Value Ref Range   ABO/RH(D) O POS    No rh immune globuloin      NOT A RH IMMUNE GLOBULIN CANDIDATE, PT RH POSITIVE Performed at [facility], [address], [place], [place] [address]    US Ob Less Than 14  Weeks With Ob Transvaginal  Result Date: 09/25/2019 CLINICAL DATA:  Pregnant patient in first-trimester pregnancy with vaginal bleeding. Pregnancy of unknown anatomic location. EXAM: OBSTETRIC <14  WK US AND TRANSVAGINAL OB US TECHNIQUE: Both transabdominal and transvaginal ultrasound examinations were performed for complete evaluation of the gestation as well as the maternal uterus, adnexal regions, and pelvic cul-de-sac. Transvaginal technique was performed to assess early pregnancy. COMPARISON:  None. FINDINGS: Intrauterine gestational sac: Single Yolk sac:  Visualized. Embryo:  Visualized. Cardiac Activity: Visualized. Heart Rate: 164 bpm CRL:  14 mm   7 w   4 d                  US EDC: 05/09/2020 Subchorionic hemorrhage:  None visualized. Maternal uterus/adnexae: Both ovaries are well visualized and are normal. Small corpus luteal cyst in the left ovary. Normal ovarian blood flow. No pelvic free fluid or adnexal mass. IMPRESSION: Single live intrauterine pregnancy estimated gestational age [redacted] weeks 4 days based [facility] crown[facility]rump [facility]. No subchorionic hemorrhage. Electronically Signed   By: Narda RutherfordMelanie  Sanford M.D.   On: 09/25/2019 23:20    MDM  Pelvic Exam; Wet Prep and GC/CT Labs: UA, UPT, CBC, hCG, ABO Ultrasound Assessment and Plan  18 year old, [name]  SIUP at 8 weeks by Unsure LMP Abdominal Pain Vaginal Bleeding Pregnancy of Unknown Anatomical Location  -Reviewed POC with patient. -Patient introduced to instruments used for pelvic exam. -Exam performed and findings discussed.  -Patient without questions or concerns.  -Will send for US -Will reassess.   Cherre RobinsJessica L Najir Roop MSN, CNM 09/25/2019, 10:16 PM   Reassessment (11:28 PM) SIUP at 7.4 weeks  -US results as above, No SCH noted. -Wet prep returns with insignificant findings. -Lab and US results discussed with patient. -Informed that GC/CT will return within 2-3 days. -hCG pending, but IUP with HR  noted. -Patient without questions or concerns.  -Nurse instructed to give Bleeding Precautions. -Vaginal bleeding information and provider list given in discharge paperwork.  -Encouraged to call or return to MAU if symptoms worsen or with the onset of new symptoms. -Discharged to home in stable condition.  Cherre RobinsJessica L Thaxton Pelley MSN, CNM Advanced Practice Provider, [facility]

## 2019-09-25 NOTE — Discharge Instructions (Signed)
[facility] Prenatal Care Providers   Center for [facility] Healthcare at [facility]       Phone: [phone]  Center for [facility] Healthcare at [place] Phone: [phone]  Center for [employer] at [place]  Phone: [phone]  Center for [facility] at [facility]  Phone: [phone]  Center for [facility] Healthcare at [facility]  Phone: [phone]  [facility]       Phone: [phone]  [facility]    Phone: [phone]   [facility])    Phone: [phone]  [facility]    Phone: [phone]  [facility]    Phone: [phone]   [facility]  Phone: [phone]  [facility]    Phone: [phone]  [facility]   Phone: [phone]  [facility]    Phone: [phone]  Vaginal Bleeding During Pregnancy, First Trimester  A small amount of bleeding from the vagina (spotting) is relatively common during early pregnancy. It usually stops on its own. Various things may cause bleeding or spotting during early pregnancy. Some bleeding may be related to the pregnancy, and some may not. In many cases, the bleeding is normal and is not a problem. However, bleeding can also be a sign of something serious. Be sure to tell your health care provider about any vaginal bleeding right away. Some possible causes of vaginal bleeding during the first trimester include:  Infection or inflammation of the cervix.  Growths (polyps) on the cervix.  Miscarriage or threatened miscarriage.  Pregnancy tissue developing outside of the uterus (ectopic pregnancy).  A mass of tissue developing in the uterus due to an egg being fertilized incorrectly (molar pregnancy). Follow these instructions at home: Activity  Follow instructions from your health care provider about  limiting your activity. Ask what activities are safe for you.  If needed, make plans for someone to help with your regular activities.  Do not have sex or orgasms until your health care provider says that this is safe. General instructions  Take over-the-counter and prescription medicines only as told by your health care provider.  Pay attention to any changes in your symptoms.  Do not use tampons or douche.  Write down how many pads you use each day, how often you change pads, and how soaked (saturated) they are.  If you pass any tissue from your vagina, save the tissue so you can show it to your health care provider.  Keep all follow-up visits as told by your health care provider. This is important. Contact a health care provider if:  You have vaginal bleeding during any part of your pregnancy.  You have cramps or labor pains.  You have a fever. Get help right away if:  You have severe cramps in your back or abdomen.  You pass large clots or a large amount of tissue from your vagina.  Your bleeding increases.  You feel light-headed or weak, or you faint.  You have chills.  You are leaking fluid or have a gush of fluid from your vagina. Summary  A small amount of bleeding (spotting) from the vagina is relatively common during early pregnancy.  Various things may cause bleeding or spotting in early pregnancy.  Be sure to tell your health care provider about any vaginal bleeding right away. This information is not intended to replace advice given to you by your health care provider. Make sure you discuss any questions  you have with your health care provider. Document Released: 09/22/2005 Document Revised: 04/03/2019 Document Reviewed: 03/17/2017 Elsevier Patient Education  2020 [name].

## 2019-09-26 LAB — HCG, QUANTITATIVE, PREGNANCY: hCG, Beta Chain, Quant, S: 73005 m[IU]/mL — ABNORMAL HIGH (ref <5)

## 2019-09-27 LAB — GC/CHLAMYDIA PROBE AMP (~~LOC~~) NOT AT ARMC
Chlamydia: NEGATIVE
Molecular Disclaimer: NEGATIVE
Molecular Disclaimer: NORMAL
Neisseria Gonorrhea: NEGATIVE

## 2019-10-02 ENCOUNTER — Other Ambulatory Visit: Payer: Self-pay

## 2019-10-02 ENCOUNTER — Ambulatory Visit: Payer: Medicaid Other

## 2019-10-02 DIAGNOSIS — Z3480 Encounter for supervision of other normal pregnancy, unspecified trimester: Secondary | ICD-10-CM | POA: Insufficient documentation

## 2019-10-02 MED ORDER — BLOOD PRESSURE CUFF MISC: 1.0000 | 0 refills | Status: DC

## 2019-10-02 NOTE — Progress Notes (Signed)
PRENATAL INTAKE SUMMARY  Ms. Doebler presents today over the phone for New OB Nurse Interview.  OB History    Gravida  1   Para      Term      Preterm      AB      Living        SAB      TAB      Ectopic      Multiple      Live Births             I have reviewed the patient's medical, obstetrical, social, and family histories, medications, and available lab results.  SUBJECTIVE She has no unusual complaints  OBJECTIVE Initial Physical Exam (New OB)  ASSESSMENT Normal pregnancy, pt had one episode of bleeding on 09/25/19. Pt reports this has resolved and she hasn't had any more bleeding. Pt instructed to return to [facility]U if any more bleeding occurs, pt verbalizes understanding.   [place] on 09/25/19 gave EDC as 05/09/20 LMP of 07/31/19 gave EDD 05/06/20

## 2019-10-03 DIAGNOSIS — I10 Essential (primary) hypertension: Secondary | ICD-10-CM | POA: Diagnosis not present

## 2019-10-12 ENCOUNTER — Encounter: Payer: Self-pay | Admitting: Nurse Practitioner

## 2019-10-12 ENCOUNTER — Ambulatory Visit (INDEPENDENT_AMBULATORY_CARE_PROVIDER_SITE_OTHER): Payer: Medicaid Other | Admitting: Nurse Practitioner

## 2019-10-12 ENCOUNTER — Other Ambulatory Visit: Payer: Self-pay

## 2019-10-12 VITALS — BP 109/66 | HR 73 | Temp 98.2°F | Wt 228.0 lb

## 2019-10-12 DIAGNOSIS — Z3480 Encounter for supervision of other normal pregnancy, unspecified trimester: Secondary | ICD-10-CM | POA: Diagnosis not present

## 2019-10-12 DIAGNOSIS — Z6841 Body Mass Index (BMI) 40.0 and over, adult: Secondary | ICD-10-CM

## 2019-10-12 DIAGNOSIS — O2341 Unspecified infection of urinary tract in pregnancy, first trimester: Secondary | ICD-10-CM

## 2019-10-12 DIAGNOSIS — O219 Vomiting of pregnancy, unspecified: Secondary | ICD-10-CM

## 2019-10-12 DIAGNOSIS — Z3A10 10 weeks gestation of pregnancy: Secondary | ICD-10-CM

## 2019-10-12 DIAGNOSIS — Z3481 Encounter for supervision of other normal pregnancy, first trimester: Secondary | ICD-10-CM

## 2019-10-12 DIAGNOSIS — S82821A Torus fracture of lower end of right fibula, initial encounter for closed fracture: Secondary | ICD-10-CM

## 2019-10-12 MED ORDER — VITAFOL ULTRA 29-0.6-0.4-200 MG PO CAPS: 1.0000 | ORAL_CAPSULE | Freq: Every day | ORAL | 11 refills | Status: AC

## 2019-10-12 MED ORDER — DOXYLAMINE-PYRIDOXINE 10-10 MG PO TBEC: DELAYED_RELEASE_TABLET | ORAL | 2 refills | Status: DC

## 2019-10-12 NOTE — Patient Instructions (Signed)
Safe Medications in Pregnancy  ° °Acne: °Benzoyl Peroxide °Salicylic Acid ° °Backache/Headache: °Tylenol: 2 regular strength every 4 hours OR °             2 Extra strength every 6 hours ° °Colds/Coughs/Allergies: °Benadryl (alcohol free) 25 mg every 6 hours as needed °Breath right strips °Claritin °Cepacol throat lozenges °Chloraseptic throat spray °Cold-Eeze- up to three times per day °Cough drops, alcohol free °Flonase  °Guaifenesin °Mucinex °Robitussin DM (plain only, alcohol free) °Saline nasal spray/drops °Sudafed (pseudoephedrine) & Actifed ** use only after [redacted] weeks gestation and if you do not have high blood pressure °Tylenol °Vicks Vaporub °Zinc lozenges °Zyrtec  ° °Constipation: °Colace °Ducolax suppositories °Fleet enema °Glycerin suppositories °Metamucil °Milk of magnesia °Miralax °Senokot °Smooth move tea ° °Diarrhea: °Kaopectate °Imodium A-D ° °*NO pepto Bismol ° °Hemorrhoids: °Anusol °Anusol HC °Preparation H °Tucks ° °Indigestion: °Tums °Maalox °Mylanta °Zantac  °Pepcid ° °Insomnia: °Benadryl (alcohol free) 25mg every 6 hours as needed °Tylenol PM °Unisom, no Gelcaps ° °Leg Cramps: °Tums °MagGel ° °Nausea/Vomiting:  °Bonine °Dramamine °Emetrol °Ginger extract °Sea bands °Meclizine  °Nausea medication to take during pregnancy:  °Unisom (doxylamine succinate 25 mg tablets) Take one tablet daily at bedtime. If symptoms are not adequately controlled, the dose can be increased to a maximum recommended dose of two tablets daily (1/2 tablet in the morning, 1/2 tablet mid-afternoon and one at bedtime). °Vitamin B6 100mg tablets. Take one tablet twice a day (up to 200 mg per day). ° °Skin Rashes: °Aveeno products °Benadryl cream or 25mg every 6 hours as needed °Calamine Lotion °1% cortisone cream ° °Yeast infection: °Gyne-lotrimin 7 °Monistat 7 ° ° °**If taking multiple medications, please check labels to avoid duplicating the same active ingredients °**take medication as directed on the label °** Do not  exceed 4000 mg of tylenol in 24 hours °**Do not take medications that contain aspirin or ibuprofen ° °  °

## 2019-10-12 NOTE — Progress Notes (Signed)
Subjective:   Kim Mejia is a 18 y.o. [name] at [redacted]w[redacted]d by early ultrasound being seen today for her first obstetrical visit.  Her obstetrical history is significant for obesity. Patient does intend to breast feed. Pregnancy history fully reviewed.  Patient reports nausea and vomiting.  HISTORY: OB History  Gravida Para Term Preterm AB Living  1 0 0 0 0 0  SAB TAB Ectopic Multiple Live Births  0 0 0 0 0    # Outcome Date GA Lbr Len/2nd Weight Sex Delivery Anes PTL Lv  1 Current            Past Medical History:  Diagnosis Date  . Obesity    Past Surgical History:  Procedure Laterality Date  . ORIF FIBULA FRACTURE Right 03/14/2017   Procedure: RIGHT OPEN REDUCTION INTERNAL FIXATION LATERAL MALLEOLUS;  Surgeon: Marybelle Killings, MD;  Location: [facility];  Service: Orthopedics;  Laterality: Right;   Family History  Problem Relation Age of Onset  . Hyperlipidemia Mother    Social History   Tobacco Use  . Smoking status: Never Smoker  . Smokeless tobacco: Never Used  . Tobacco comment: father smokes, visits on weekends  Substance Use Topics  . Alcohol use: No    Alcohol/week: 0.0 standard drinks  . Drug use: No   Allergies  Allergen Reactions  . No Known Allergies    Current Outpatient Medications on File Prior to Visit  Medication Sig Dispense Refill  . Blood Pressure Monitoring (BLOOD PRESSURE CUFF) MISC 1 Device by Does not apply route once a week. 1 each 0  . Prenatal Vit-Fe Fumarate-FA (MULTIVITAMIN-PRENATAL) 27-0.8 MG TABS tablet Take 1 tablet by mouth daily at 12 noon.     No current facility-administered medications on file prior to visit.      Exam   Vitals:   10/12/19 1046  BP: 109/66  Pulse: 73  Temp: 98.2 F (36.8 C)  Weight: 228 lb (103.4 kg)   Fetal Heart Rate (bpm): 134  Uterus:  Fundal Height: 10 cm  Pelvic Exam: Perineum: Deferred - GC/Chlam done 2 weeks ago   Vulva:    Vagina:     Cervix:    Adnexa:    Bony Pelvis:   System: General:  well-developed, well-nourished female in no acute distress   Breast:  normal appearance, no masses or tenderness   Skin: normal coloration and turgor, no rashes   Neurologic: oriented, normal, negative, normal mood   Extremities: normal strength, tone, and muscle mass, ROM of all joints is normal   HEENT extraocular movement intact and sclera clear, anicteric   Mouth/Teeth mucous membranes moist, pharynx normal without lesions and dental hygiene good   Neck supple and no masses, normal thyroid   Cardiovascular: regular rate and rhythm   Respiratory:  no respiratory distress, normal breath sounds   Abdomen: soft, non-tender; no masses,  no organomegaly     Assessment:   Pregnancy: [name] Patient Active Problem List   Diagnosis Date Noted  . Supervision of other normal pregnancy, antepartum 10/02/2019  . Closed fracture of distal fibula 03/10/2017  . 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 BMI (body mass index), pediatric, 95-99% for age 51/25/2016  . Language barrier 03/21/2015  . Pain of left calf 08/21/2013     Plan:  1. Supervision of other normal pregnancy, antepartum She wants to return next week for flu vaccine Prescribed Diclegis for nausea and vomiting. Prescribed prenatal vitamins - client may not  realize - did this after she left. Discussed babyscripts and MyChart Advised childbirth and breasfeeding online classes - referred to babyscripts for more info. - Enroll Patient in Babyscripts - Babyscripts Schedule Optimization - Obstetric Panel, Including HIV - Culture, OB Urine - Genetic Screening  2. BMI 40.0-44.9, adult (HCC) Advised weight loss of 11-20 pounds  Initial labs drawn. Continue prenatal vitamins. Genetic Screening discussed, Panorama and Horizon: ordered. Ultrasound discussed; fetal anatomic survey: discussed and will order at next visit. Problem list reviewed and updated. The nature of [facility] with  multiple MDs and other Advanced Practice Providers was explained to patient; also emphasized that residents, students are part of our team. Routine obstetric precautions reviewed. Return in about 1 week (around 10/19/2019) for RN visit for flu shot and in 4 weeks for virtual ROB.  Total face-to-face time with patient: 40 minutes.  Over 50% of encounter was spent on counseling and coordination of care.     Nolene Bernheim, FNP Family Nurse Practitioner, [facility] for [employer], [facility] 10/12/2019 11:31 AM

## 2019-10-12 NOTE — Progress Notes (Signed)
New OB.  C/o NV.  Patient declined Flu vaccine.

## 2019-10-13 LAB — OBSTETRIC PANEL, INCLUDING HIV
Antibody Screen: NEGATIVE
Basophils Absolute: 0 10*3/uL (ref 0.0–0.2)
Basos: 0 %
EOS (ABSOLUTE): 0.1 10*3/uL (ref 0.0–0.4)
Eos: 1 %
HIV Screen 4th Generation wRfx: NONREACTIVE
Hematocrit: 35.7 % (ref 34.0–46.6)
Hemoglobin: 11.7 g/dL (ref 11.1–15.9)
Hepatitis B Surface Ag: NEGATIVE
Immature Grans (Abs): 0 10*3/uL (ref 0.0–0.1)
Immature Granulocytes: 0 %
Lymphocytes Absolute: 1.6 10*3/uL (ref 0.7–3.1)
Lymphs: 16 %
MCH: 28.8 pg (ref 26.6–33.0)
MCHC: 32.8 g/dL (ref 31.5–35.7)
MCV: 88 fL (ref 79–97)
Monocytes Absolute: 0.7 10*3/uL (ref 0.1–0.9)
Monocytes: 7 %
Neutrophils Absolute: 7.6 10*3/uL — ABNORMAL HIGH (ref 1.4–7.0)
Neutrophils: 76 %
Platelets: 283 10*3/uL (ref 150–450)
RBC: 4.06 x10E6/uL (ref 3.77–5.28)
RDW: 12.8 % (ref 11.7–15.4)
RPR Ser Ql: NONREACTIVE
Rh Factor: POSITIVE
Rubella Antibodies, IGG: 2.09 index (ref >0.99)
WBC: 10 10*3/uL (ref 3.4–10.8)

## 2019-10-15 DIAGNOSIS — O2341 Unspecified infection of urinary tract in pregnancy, first trimester: Secondary | ICD-10-CM | POA: Insufficient documentation

## 2019-10-15 LAB — URINE CULTURE, OB REFLEX

## 2019-10-15 LAB — CULTURE, OB URINE

## 2019-10-15 MED ORDER — CEPHALEXIN 500 MG PO CAPS: 500.0000 mg | ORAL_CAPSULE | Freq: Four times a day (QID) | ORAL | 0 refills | Status: AC

## 2019-10-15 NOTE — Addendum Note (Signed)
Addended by: Virginia Rochester on: 10/15/2019 08:23 AM   Modules accepted: Orders

## 2019-10-15 NOTE — Progress Notes (Signed)
UTI noted on urine culture.  Keflex sent to her pharmacy and clinical staff to notify her as MyChart is not active.  Earlie Server, RN, MSN, NP-BC Nurse Practitioner, [facility] for [employer], [place] [facility] 10/15/2019 8:21 AM

## 2019-10-18 ENCOUNTER — Telehealth: Payer: Self-pay | Admitting: Licensed Clinical Social Worker

## 2019-10-18 NOTE — Telephone Encounter (Signed)
Patient confirmed scheduled nurse visit. Patient reports no birth control history and declined birth control after she delivers.

## 2019-10-19 ENCOUNTER — Other Ambulatory Visit: Payer: Self-pay

## 2019-10-19 ENCOUNTER — Ambulatory Visit (INDEPENDENT_AMBULATORY_CARE_PROVIDER_SITE_OTHER): Payer: Medicaid Other

## 2019-10-19 DIAGNOSIS — Z23 Encounter for immunization: Secondary | ICD-10-CM | POA: Diagnosis not present

## 2019-10-19 NOTE — Progress Notes (Signed)
Pt presents to the clinic for a flu shot.  Pt tolerated injection well in the LD without complications. -EH/RMA

## 2019-10-22 ENCOUNTER — Encounter: Payer: Self-pay | Admitting: Obstetrics and Gynecology

## 2019-10-23 ENCOUNTER — Encounter: Payer: Self-pay | Admitting: Obstetrics and Gynecology

## 2019-10-23 DIAGNOSIS — O09899 Supervision of other high risk pregnancies, unspecified trimester: Secondary | ICD-10-CM | POA: Insufficient documentation

## 2019-10-24 ENCOUNTER — Encounter: Payer: Self-pay | Admitting: Obstetrics and Gynecology

## 2019-10-29 ENCOUNTER — Other Ambulatory Visit: Payer: Self-pay

## 2019-10-29 ENCOUNTER — Encounter: Payer: Self-pay | Admitting: *Deleted

## 2019-10-29 ENCOUNTER — Other Ambulatory Visit (HOSPITAL_COMMUNITY)
Admission: RE | Admit: 2019-10-29 | Discharge: 2019-10-29 | Disposition: A | Discharge status: Home / Self Care | Payer: Medicaid Other | Source: Ambulatory Visit | Attending: Obstetrics | Admitting: Obstetrics

## 2019-10-29 ENCOUNTER — Ambulatory Visit: Payer: Medicaid Other | Admitting: *Deleted

## 2019-10-29 DIAGNOSIS — Z3480 Encounter for supervision of other normal pregnancy, unspecified trimester: Secondary | ICD-10-CM

## 2019-10-29 DIAGNOSIS — N898 Other specified noninflammatory disorders of vagina: Secondary | ICD-10-CM | POA: Diagnosis not present

## 2019-10-29 NOTE — Progress Notes (Signed)
Pt is in office for Self Swab today due to vaginal itching.  Pt made aware testing for BV and Yeast and cultures to be sent today.  Treatment will be based on results.  Pt advised to call office if any other concerns.   Pt is doing well and has no other complaints today.

## 2019-10-30 LAB — CERVICOVAGINAL ANCILLARY ONLY
Bacterial Vaginitis (gardnerella): POSITIVE — AB
Candida Glabrata: NEGATIVE
Candida Vaginitis: POSITIVE — AB
Chlamydia: NEGATIVE
Comment: NEGATIVE
Comment: NEGATIVE
Comment: NEGATIVE
Comment: NEGATIVE
Comment: NEGATIVE
Comment: NORMAL
Neisseria Gonorrhea: NEGATIVE
Trichomonas: NEGATIVE

## 2019-10-31 ENCOUNTER — Other Ambulatory Visit: Payer: Self-pay | Admitting: Obstetrics

## 2019-10-31 ENCOUNTER — Telehealth: Payer: Self-pay

## 2019-10-31 DIAGNOSIS — B373 Candidiasis of vulva and vagina: Secondary | ICD-10-CM

## 2019-10-31 DIAGNOSIS — B3731 Acute candidiasis of vulva and vagina: Secondary | ICD-10-CM

## 2019-10-31 DIAGNOSIS — B9689 Other specified bacterial agents as the cause of diseases classified elsewhere: Secondary | ICD-10-CM

## 2019-10-31 DIAGNOSIS — N760 Acute vaginitis: Secondary | ICD-10-CM

## 2019-10-31 MED ORDER — TERCONAZOLE 0.4 % VA CREA: 1.0000 | TOPICAL_CREAM | Freq: Every day | VAGINAL | 0 refills | Status: DC

## 2019-10-31 MED ORDER — TINIDAZOLE 500 MG PO TABS: 1000.0000 mg | ORAL_TABLET | Freq: Every day | ORAL | 2 refills | Status: DC

## 2019-10-31 NOTE — Telephone Encounter (Signed)
Patient notified of results and RX 

## 2019-11-09 ENCOUNTER — Encounter: Payer: Medicaid Other | Admitting: Family Medicine

## 2019-11-13 ENCOUNTER — Ambulatory Visit (INDEPENDENT_AMBULATORY_CARE_PROVIDER_SITE_OTHER): Payer: Medicaid Other | Admitting: Nurse Practitioner

## 2019-11-13 ENCOUNTER — Encounter: Payer: Self-pay | Admitting: Nurse Practitioner

## 2019-11-13 DIAGNOSIS — Z3480 Encounter for supervision of other normal pregnancy, unspecified trimester: Secondary | ICD-10-CM

## 2019-11-13 DIAGNOSIS — O09899 Supervision of other high risk pregnancies, unspecified trimester: Secondary | ICD-10-CM

## 2019-11-13 DIAGNOSIS — O09892 Supervision of other high risk pregnancies, second trimester: Secondary | ICD-10-CM

## 2019-11-13 DIAGNOSIS — O2342 Unspecified infection of urinary tract in pregnancy, second trimester: Secondary | ICD-10-CM

## 2019-11-13 DIAGNOSIS — Z3A14 14 weeks gestation of pregnancy: Secondary | ICD-10-CM

## 2019-11-13 DIAGNOSIS — O2341 Unspecified infection of urinary tract in pregnancy, first trimester: Secondary | ICD-10-CM

## 2019-11-13 NOTE — Progress Notes (Signed)
I connected with@ on 11/13/19 at  3:55 PM EST by: Mychart and verified that I am speaking with the correct person using two identifiers.  Patient is located at home and provider is located at [place] office.     The purpose of this virtual visit is to provide medical care while limiting exposure to the novel coronavirus. I discussed the limitations, risks, security and privacy concerns of performing an evaluation and management service by Earlie Server, NP and the availability of in person appointments. I also discussed with the patient that there may be a patient responsible charge related to this service. By engaging in this virtual visit, you consent to the provision of healthcare.  Additionally, you authorize for your insurance to be billed for the services provided during this visit.  The patient expressed understanding and agreed to proceed.  The following staff members participated in the virtual visit:  Wilhemina Bonito, CMA and Earlie Server, NP    PRENATAL VISIT NOTE  Subjective:  Kim Mejia is a 18 y.o. [name] at [redacted]w[redacted]d  for phone visit for ongoing prenatal care.  She is currently monitored for the following issues for this low-risk pregnancy and has Pain of left calf; BMI (body mass index), pediatric, 95-99% for age; Language barrier; Claudication of calf muscles left; Closed fracture of distal fibula; Supervision of other normal pregnancy, antepartum; UTI in pregnancy, antepartum, first trimester; and Cystic fibrosis carrier, antepartum on their problem list.  Patient reports periodic lower abdominal pain. No bleeding.  No vaginal discharge. Contractions: Not present. Vag. Bleeding: None.  Movement: Absent. Denies leaking of fluid.   The following portions of the patient's history were reviewed and updated as appropriate: allergies, current medications, past family history, past medical history, past social history, past surgical history and problem list.   Objective:  There were no  vitals filed for this visit. Self-Obtained  Fetal Status:     Movement: Absent     Assessment and Plan:  Pregnancy: [name] at [redacted]w[redacted]d 1. Supervision of other normal pregnancy, antepartum lower abd pain - advised to drink more fluids and use tylenol.  It may be her uterus growing or round ligament pain.  If it is worsening, will need to come for urine specimen to check for UTI.  If any bleeding develops, she will need to be seen in the office.  Reviewed increasing fluids and dietary fiber to hopefully have a BM daily. From HX, had UTI in early pregnancy - will set up lab visit to drop off urine for culture again. BP cuff is not working.  To bring to next visit. Next visit in person to get AFP and have BP cuff checked at [redacted]w[redacted]d Client is set up to do [place] visits   - [place] MFM OB  + 14 WK; Future - detailed - Ambulatory referral to Genetics for CF carrier status  Preterm labor symptoms and general obstetric precautions including but not limited to vaginal bleeding, contractions, leaking of fluid and fetal movement were reviewed in detail with the patient.  Return in about 3 weeks (around 12/04/2019) for in person ROB needs AFP drawn and bring BP cuff..  Future Appointments  Date Time Provider [place]  12/18/2019  8:00 AM [facility]  12/18/2019  8:00 AM [facility]-MFC [place] 3 [facility]-MFCUS MFC-US  12/18/2019  9:00 AM [facility] GENETIC COUNSELING [facility] [place] M[facility]-US     Time spent on virtual visit: 12 minutes  Earlie Server, RN, MSN, NP-BC Nurse Practitioner, [facility]  [facility], [employer] [facility] 11/13/2019 4:44 PM

## 2019-11-13 NOTE — Progress Notes (Signed)
Virtual Visit via Telephone Note  I connected with Kim Mejia on 11/13/19 at  3:55 PM EST by telephone and verified that I am speaking with the correct person using two identifiers.   Pt c/o intermittent pain under navel area.  BP cuff not working per pt.

## 2019-11-14 ENCOUNTER — Ambulatory Visit: Payer: Medicaid Other

## 2019-11-14 ENCOUNTER — Other Ambulatory Visit: Payer: Self-pay

## 2019-11-14 DIAGNOSIS — R300 Dysuria: Secondary | ICD-10-CM

## 2019-11-14 NOTE — Progress Notes (Signed)
Pt is here to leave a urine specimen to send off for culture. Pt had virtual visit with provider yesterday and complained of dysuria. Pt advised she will be made aware once UC is back, pt verbalizes understanding.

## 2019-11-14 NOTE — Addendum Note (Signed)
Addended by: Lewie Loron D on: 11/14/2019 11:35 AM   Modules accepted: Orders

## 2019-11-16 LAB — URINE CULTURE, OB REFLEX

## 2019-11-16 LAB — CULTURE, OB URINE

## 2019-12-04 ENCOUNTER — Other Ambulatory Visit: Payer: Self-pay

## 2019-12-04 ENCOUNTER — Ambulatory Visit (INDEPENDENT_AMBULATORY_CARE_PROVIDER_SITE_OTHER): Payer: Medicaid Other | Admitting: Advanced Practice Midwife

## 2019-12-04 VITALS — BP 106/70 | HR 76 | Wt 226.6 lb

## 2019-12-04 DIAGNOSIS — Z3A17 17 weeks gestation of pregnancy: Secondary | ICD-10-CM

## 2019-12-04 DIAGNOSIS — O99212 Obesity complicating pregnancy, second trimester: Secondary | ICD-10-CM | POA: Diagnosis not present

## 2019-12-04 DIAGNOSIS — Z3480 Encounter for supervision of other normal pregnancy, unspecified trimester: Secondary | ICD-10-CM

## 2019-12-04 DIAGNOSIS — O99210 Obesity complicating pregnancy, unspecified trimester: Secondary | ICD-10-CM | POA: Insufficient documentation

## 2019-12-04 DIAGNOSIS — Z3A19 19 weeks gestation of pregnancy: Secondary | ICD-10-CM

## 2019-12-04 NOTE — Progress Notes (Signed)
Pt presents for ROB. Pt has no concerns today. Pt has already received her flu vaccine.

## 2019-12-04 NOTE — Patient Instructions (Signed)
Call Kim Mejia to schedule appointment with a genetic counselor at [phone].     Healthy Weight Gain During Pregnancy, Adult A certain amount of weight gain during pregnancy is normal and healthy. How much weight you should gain depends on your overall health and a measurement called BMI (body mass index). BMI is an estimate of your body fat based on your height and weight. You can use an Software engineer to figure out your BMI, or you can ask your health care provider to calculate it for you at your next visit. Your recommended pregnancy weight gain is based on your pre-pregnancy BMI. General guidelines for a healthy total weight gain during pregnancy are listed below. If your BMI at or before the start of your pregnancy is:  Less than 18.5 (underweight), you should gain 28-40 lb (13-18 kg).  18.5-24.9 (normal weight), you should gain 25-35 lb (11-16 kg).  25-29.9 (overweight), you should gain 15-25 lb (7-11 kg).  30 or higher (obese), you should gain 11-20 lb (5-9 kg). These ranges vary depending on your individual health. If you are carrying more than one baby (multiples), it may be safe to gain more weight than these recommendations. If you gain less weight than recommended, that may be safe as long as your baby is growing and developing normally. How can unhealthy weight gain affect me and my baby? Gaining too much weight during pregnancy can lead to pregnancy complications, such as:  A temporary form of diabetes that develops during pregnancy (gestational diabetes).  High blood pressure during pregnancy and protein in your urine (preeclampsia).  High blood pressure during pregnancy without protein in your urine (gestational hypertension).  Your baby having a high weight at birth, which may: ? Raise your risk of having a more difficult delivery or a surgical delivery (cesarean delivery, or C-section). ? Raise your child's risk of developing obesity during childhood. Not gaining  enough weight can be life-threatening for your baby, and it may raise your baby's chances of:  Being born early (preterm).  Growing more slowly than normal during pregnancy (growth restriction).  Having a low weight at birth. What actions can I take to gain a healthy amount of weight during pregnancy? General instructions  Keep track of your weight gain during pregnancy.  Take over-the-counter and prescription medicines only as told by your health care provider. Take all prenatal supplements as directed.  Keep all health care visits during pregnancy (prenatal visits). These visits are a good time to discuss your weight gain. Your health care provider will weigh you at each visit to make sure you are gaining a healthy amount of weight. Nutrition   Eat a balanced, nutrient-rich diet. Eat plenty of: ? Fruits and vegetables, such as berries and broccoli. ? Whole grains, such as millet, barley, whole-wheat breads and cereals, and oatmeal. ? Low-fat dairy products or non-dairy products such as almond milk or rice milk. ? Protein foods, such as lean meat, chicken, eggs, and legumes (such as peas, beans, soybeans, and lentils).  Avoid foods that are fried or have a lot of fat, salt (sodium), or sugar.  Drink enough fluid to keep your urine pale yellow.  Choose healthy snack and drink options when you are at work or on the go: ? Drink water. Avoid soda, sports drinks, and juices that have added sugar. ? Avoid drinks with caffeine, such as coffee and energy drinks. ? Eat snacks that are high in protein, such as nuts, protein bars, and low-fat yogurt. ? Carry  convenient snacks in your purse that do not need refrigeration, such as a pack of trail mix, an apple, or a granola bar.  If you need help improving your diet, work with a health care provider or a diet and nutrition specialist (dietitian). Activity   Exercise regularly, as told by your health care provider. ? If you were active  before becoming pregnant, you may be able to continue your regular fitness activities. ? If you were not active before pregnancy, you may gradually build up to exercising for 30 or more minutes on most days of the week. This may include walking, swimming, or yoga.  Ask your health care provider what activities are safe for you. Talk with your health care provider about whether you may need to be excused from certain school or work activities. Where to find more information Learn more about managing your weight gain during pregnancy from:  American Pregnancy Association: www.americanpregnancy.org  U.S. Department of Agriculture pregnancy weight gain calculator: FormerBoss.no Summary  Too much weight gain during pregnancy can lead to complications for you and your baby.  Find out your pre-pregnancy BMI to determine how much weight gain is healthy for you.  Eat nutritious foods and stay active.  Keep all of your prenatal visits as told by your health care provider. This information is not intended to replace advice given to you by your health care provider. Make sure you discuss any questions you have with your health care provider. Document Released: 09/02/2017 Document Revised: 09/02/2017 Document Reviewed: 09/02/2017 Elsevier Patient Education  2020 [name].

## 2019-12-04 NOTE — Progress Notes (Signed)
   PRENATAL VISIT NOTE  Subjective:  Kim Mejia is a 18 y.o. [name] at 9w4dbeing seen today for ongoing prenatal care.  She is currently monitored for the following issues for this low-risk pregnancy and has Pain of left calf; BMI (body mass index), pediatric, 95-99% for age; Language barrier; Claudication of calf muscles left; Closed fracture of distal fibula; Supervision of other normal pregnancy, antepartum; UTI in pregnancy, antepartum, first trimester; Cystic fibrosis carrier, antepartum; and Obesity in pregnancy on their problem list.  Patient reports no complaints.  Contractions: Not present.  .  Movement: Present. Denies leaking of fluid.   The following portions of the patient's history were reviewed and updated as appropriate: allergies, current medications, past family history, past medical history, past social history, past surgical history and problem list.   Objective:   Vitals:   12/04/19 1023  BP: 106/70  Pulse: 76  Weight: 226 lb 9.6 oz (102.8 kg)    Fetal Status: Fetal Heart Rate (bpm): 145   Movement: Present     General:  Alert, oriented and cooperative. Patient is in no acute distress.  Skin: Skin is warm and dry. No rash noted.   Cardiovascular: Normal heart rate noted  Respiratory: Normal respiratory effort, no problems with respiration noted  Abdomen: Soft, gravid, appropriate for gestational age.  Pain/Pressure: Absent     Pelvic: Cervical exam deferred        Extremities: Normal range of motion.  Edema: None  Mental Status: Normal mood and affect. Normal behavior. Normal judgment and thought content.   Assessment and Plan:  Pregnancy: [name] at [redacted]w[redacted]d. Obesity affecting pregnancy in second trimester --Discussed increased risks with pt, and recommendation for baseline labs, baby ASA, and nutrition consult.  - Consult to dietitian - Hemoglobin A1c - Comp Met (CMET) - Protein / creatinine ratio, urine  2. Supervision of other normal pregnancy,  antepartum --Discussed normal changes during second trimester of pregnancy --Anticipatory guidance about next visits/weeks of pregnancy given. --Next visit in 4 weeks, virtual  Return in about 4 weeks (around 01/01/2020).  Future Appointments  Date Time Provider [place]/22/2020  8:00 AM [place]  12/18/2019  8:00 AM WH-MFC [place] WH-MFCUS MFC-US  12/18/2019  9:00 AM WH[place]arnakENETIC COUNSELING RM WHPlymouthFC-US    LiFatima BlankCNM

## 2019-12-05 LAB — COMPREHENSIVE METABOLIC PANEL
ALT: 7 IU/L (ref 0–32)
AST: 9 IU/L (ref 0–40)
Albumin/Globulin Ratio: 1.3 (ref 1.2–2.2)
Albumin: 3.6 g/dL — ABNORMAL LOW (ref 3.9–5.0)
Alkaline Phosphatase: 96 IU/L (ref 39–117)
BUN/Creatinine Ratio: 9 (ref 9–23)
BUN: 5 mg/dL — ABNORMAL LOW (ref 6–20)
Bilirubin Total: <0.2 mg/dL (ref 0.0–1.2)
CO2: 21 mmol/L (ref 20–29)
Calcium: 8.9 mg/dL (ref 8.7–10.2)
Chloride: 101 mmol/L (ref 96–106)
Creatinine, Ser: 0.56 mg/dL — ABNORMAL LOW (ref 0.57–1.00)
GFR calc Af Amer: 155 mL/min/{1.73_m2} (ref >59)
GFR calc non Af Amer: 135 mL/min/{1.73_m2} (ref >59)
Globulin, Total: 2.8 g/dL (ref 1.5–4.5)
Glucose: 79 mg/dL (ref 65–99)
Potassium: 3.9 mmol/L (ref 3.5–5.2)
Sodium: 136 mmol/L (ref 134–144)
Total Protein: 6.4 g/dL (ref 6.0–8.5)

## 2019-12-05 LAB — PROTEIN / CREATININE RATIO, URINE
Creatinine, Urine: 180.1 mg/dL
Protein, Ur: 12 mg/dL
Protein/Creat Ratio: 67 mg/g creat (ref 0–200)

## 2019-12-05 LAB — HEMOGLOBIN A1C
Est. average glucose Bld gHb Est-mCnc: 91 mg/dL
Hgb A1c MFr Bld: 4.8 % (ref 4.8–5.6)

## 2019-12-18 ENCOUNTER — Ambulatory Visit (HOSPITAL_COMMUNITY): Payer: Medicaid Other | Admitting: *Deleted

## 2019-12-18 ENCOUNTER — Ambulatory Visit (HOSPITAL_COMMUNITY)
Admission: RE | Admit: 2019-12-18 | Discharge: 2019-12-18 | Disposition: A | Discharge status: Home / Self Care | Payer: Medicaid Other | Source: Ambulatory Visit | Attending: Obstetrics and Gynecology | Admitting: Obstetrics and Gynecology

## 2019-12-18 ENCOUNTER — Ambulatory Visit (HOSPITAL_BASED_OUTPATIENT_CLINIC_OR_DEPARTMENT_OTHER): Payer: Medicaid Other | Admitting: Genetic Counselor

## 2019-12-18 ENCOUNTER — Encounter (HOSPITAL_COMMUNITY): Payer: Self-pay

## 2019-12-18 ENCOUNTER — Other Ambulatory Visit: Payer: Self-pay

## 2019-12-18 ENCOUNTER — Ambulatory Visit (HOSPITAL_COMMUNITY): Payer: Self-pay | Admitting: Genetic Counselor

## 2019-12-18 ENCOUNTER — Other Ambulatory Visit (HOSPITAL_COMMUNITY): Payer: Self-pay | Admitting: *Deleted

## 2019-12-18 DIAGNOSIS — Z3480 Encounter for supervision of other normal pregnancy, unspecified trimester: Secondary | ICD-10-CM | POA: Diagnosis not present

## 2019-12-18 DIAGNOSIS — O09899 Supervision of other high risk pregnancies, unspecified trimester: Secondary | ICD-10-CM

## 2019-12-18 DIAGNOSIS — Z141 Cystic fibrosis carrier: Secondary | ICD-10-CM | POA: Insufficient documentation

## 2019-12-18 DIAGNOSIS — Z3A19 19 weeks gestation of pregnancy: Secondary | ICD-10-CM

## 2019-12-18 DIAGNOSIS — O99210 Obesity complicating pregnancy, unspecified trimester: Secondary | ICD-10-CM

## 2019-12-18 DIAGNOSIS — O09892 Supervision of other high risk pregnancies, second trimester: Secondary | ICD-10-CM

## 2019-12-18 DIAGNOSIS — O99212 Obesity complicating pregnancy, second trimester: Secondary | ICD-10-CM

## 2019-12-18 DIAGNOSIS — Z315 Encounter for genetic counseling: Secondary | ICD-10-CM | POA: Diagnosis not present

## 2019-12-18 NOTE — Progress Notes (Signed)
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 Kim Mejia came to her appointment alone due to COVID-19 visitor restrictions. Second year [address] of [address] genetic counseling student Kim Mejia assisted in this session under my supervision.  Indication for genetic counseling - Carrier for cystic fibrosis  Prenatal history  Kim Mejia is a G83P0, 18 y.o. female. Her current pregnancy has completed [redacted]w[redacted]d (Estimated Date of Delivery: 05/09/20).  Kim Mejia denied exposure to environmental toxins or chemical agents. She denied the use of alcohol, tobacco or street drugs. She reported taking prenatal vitamins. She denied significant viral illnesses, fevers, and bleeding during the course of her pregnancy. Her medical and surgical histories were noncontributory.  Family History  A three generation pedigree was drafted and reviewed. The family history is remarkable for the following:  - Kim Mejia has a paternal uncle who has a daughter and son with epilepsy diagnosed in adolescence. Seizures can occur secondary to a variety of environmental, lifestyle, and genetic factors. When epilepsy does not have an identified genetic cause, the chance that a Mejia degree relative of someone with epilepsy will also have or develop epilepsy is approximately 2-5%. Given that Kim Mejia's cousins are fourth degree relatives to her fetus, recurrence risk for epilepsy is unlikely to be elevated over the general population risk of 1%. However, without knowing the etiology of the seizures in the family, precise risk assessment is limited.  - Kim Mejia's partner, Kim Mejia, was born with a kidney abnormality that required surgery at birth. His medical and developmental histories are otherwise noncontributory. We reviewed  that kidney disease and renal anomalies can occur due to a variety of environmental, lifestyle, and genetic factors. More than 60 genetic diseases are currently known to directly or indirectly affect the kidneys. While some inherited conditions are associated with only mild symptoms, others can cause severe health problems. Given that Kim Mejia does not have any other medical or developmental problems, his renal anomaly is likely isolated. Congenital renal anomalies occur in approximately 3-7 out of 1000 live births. 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   The remaining family histories were reviewed and found to be noncontributory for birth defects, intellectual disability, recurrent pregnancy loss, and known genetic conditions.    The patient's ethnicity is [place]. The father of the pregnancy's ethnicity is [place]. Ashkenazi Jewish ancestry and consanguinity were denied. Pedigree will be scanned under Media.  Discussion  Kim Mejia had Horizon-14 carrier screening through the laboratory [facility]. 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 CF is a condition characterized by the buildup of thick, sticky mucus that can damage the body's organs. Mucus is a slippery substance that lubricates and protects the linings of the airways, digestive system, reproductive system, and other organs and tissues. Individuals with CF have abnormally sticky mucus that can clog the airways and digestive system, leading to progressive damage to the respiratory system and chronic digestive system problems. The most common features of CF include respiratory difficulties, bacterial infections in the lungs, the formation of scar tissue (fibrosis) and cysts in  the lungs, pancreatic insufficiency, CF-related diabetes mellitus, diarrhea,  malnutrition, poor growth, and weight loss. Most men with CF have congenital bilateral absence of the vas deferens (CBAVD) which causes female infertility. CF is variably expressed, meaning features of the condition and their severity vary among affected individuals.   CF is caused by pathogenic variants in the CFTR gene. This gene provides instructions for the creation of a channel that transports chloride ions into and out of cells. The flow of chloride ions helps control the movement of water in the body's tissues, which is necessary for the production of thin, freely flowing mucus. Pathogenic variants in the CFTR gene disrupt the function of the chloride channels, preventing them from regulating the flow of chloride ions and water across cell membranes. 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 The current fetus is only at risk for CF if Kim Mejia's partner is also a carrier for the condition. Based on the carrier frequency for CF in the Hispanic population, Kim Mejia's partner has a 1 in 3958 chance of being a carrier for CF. If Kim Mejia's partner is also a carrier, the risk for CF in the current fetus would be 1 in 4 (25%).   Kim Mejia's carrier screening was negative for the other 13 conditions screened. Thus, her risk to be a carrier for these additional conditions (listed separately in the laboratory report) has been reduced but not eliminated. This also significantly reduces her risk of having a child affected by one of these conditions. We discussed that carrier testing for cystic fibrosis is recommended for Kim Mejia's partner. Kim Mejia indicated that she is interested in pursuing partner carrier screening.  We also reviewed that Kim Mejia had Panorama NIPS through the laboratory Kim Mejia that was low-risk for fetal aneuploidies. We  reviewed that these results showed a less than 1 in 10,000 risk for trisomies 21, 18 and 13, and monosomy X (Turner syndrome).  In addition, the risk for triploidy and sex chromosome trisomies (47,XXX and 47,XXY) was also low. Kim Mejia elected to have cfDNA analysis for 22q11.2 deletion syndrome, which was also low risk (1 in 2900). We reviewed that while this testing identifies 94-99% of pregnancies with trisomy 2321, trisomy 3413, trisomy 4618, sex chromosome aneuploidies, and triploidy, it is NOT diagnostic. A positive test result requires confirmation by CVS or amniocentesis, and a negative test result does not rule out a fetal chromosome abnormality. She also understands that this testing does not identify all genetic conditions.  A complete ultrasound was performed today prior to our visit. The ultrasound report will be sent under separate cover. There were no visualized fetal anomalies or markers suggestive of aneuploidy.   Kim Mejia was also counseled regarding diagnostic testing via amniocentesis. We discussed the technical aspects of the procedure and quoted up to a 1 in 500 (0.2%) risk for spontaneous pregnancy loss or other adverse pregnancy outcomes as a result of amniocentesis. Cultured cells from an amniocentesis sample allow for the visualization of a fetal karyotype, which can detect >99% of chromosomal aberrations. Chromosomal microarray can also be performed to identify smaller deletions or duplications of fetal chromosomal material. Amniocentesis could also be performed to assess whether the baby is affected by CF. After careful consideration, Ms. Chenette declined amniocentesis at this time. She understands that amniocentesis is available at any point after 16 weeks of pregnancy and that she may opt to undergo the procedure at a later date should she change her mind.  Lastly, the patient was made aware that screening for open neural tube defects (ONTDs) via MS-AFP in the second trimester in  addition to level II ultrasound examination is recommended. We reviewed that Ms. Swartout's level II ultrasound did not detect any ONTDs, and that level II ultrasound is able to detect them with 90-95% sensitivity. However, normal results from any of the above options do not guarantee a normal baby, as 3-5% of newborns have some type of birth defect, many of which are not prenatally diagnosable.  Ms. Hegwood was interested in pursuing carrier screening for CF for her partner, Rueben Bash; however, he does not have health insurance. We discussed that I may be able to facilitate free testing for him through [facility]'s Patient Assistance Program. We made a plan for me to contact her after the holidays so I can coordinate carrier screening for her partner. Ms. Hardie was agreeable to this plan.  I counseled Ms. Devivo regarding the above risks and available options. The approximate face-to-face time with the genetic counselor was 40 minutes.  In summary:  Discussed carrier screening results and options for follow-up testing  Carrier for cystic fibrosis  Desires partner carrier screening. I will contact Ms. Delawder after the holidays to coordinate this  Reviewed low-risk NIPS results  Reduction in risk for Down syndrome,trisomy 18,trisomy 46, sex chromosome aneuploidies, and 22q11.2deletion syndrome  Reviewed results of ultrasound  No fetal anomalies or markers seen  Reduction in risk for fetal aneuploidy  Offered additional testing and screening  Declined amniocentesis  Recommend MS-AFP screening  Reviewed family history concerns   Buelah Manis, MS Genetic Counselor

## 2019-12-28 NOTE — L&D Delivery Note (Addendum)
Patient is 19 y.o. [name] [redacted]w[redacted]d admitted 5/16 for IOL for gHTN and NRNST, hx of GBS positive, progressed with cytotec, FB, pitocin and AROM augmentation.    Delivery Note At 7:20 AM a viable and healthy female was delivered via Vaginal, Spontaneous (Presentation: Right Occiput Anterior).  APGAR: 8, 9, weight pending.  After 2 minute delay, cord clamped, cut by support person, and blood was collected by provider.  Placenta delivered via Tomasa Blase, noted to be intact with 3VC upon inspection.  Long strand of membrane noted on vaginal inspection and removed in whole with ringed forceps.  Vaginal inspection revealed 2nd degree perineal laceration with small hemostatic periurethral abrasion.  The lacteration was repaired with 3.0 vicryl.    Anesthesia: Epidural Episiotomy:  None Lacerations:  2nd degree Suture Repair: 3.0 vicryl Est. Blood Loss (mL): 538  Mom to postpartum.  Baby to Couplet care / Skin to Skin.  Solmon Ice Meccariello 05/12/2020, 7:33 AM   I was gloved and present for delivery in its entirety.  Second stage of labor progressed, baby delivered after 1.5 hours of pushing. Delivery call made due to thick meconium. Infant immediately crying with good tone.   Complications: none  Lacerations: 2nd degree perineal  Rolm Bookbinder, CNM 7:58 AM

## 2020-01-01 ENCOUNTER — Telehealth (INDEPENDENT_AMBULATORY_CARE_PROVIDER_SITE_OTHER): Payer: Medicaid Other | Admitting: Advanced Practice Midwife

## 2020-01-01 DIAGNOSIS — Z3A21 21 weeks gestation of pregnancy: Secondary | ICD-10-CM

## 2020-01-01 DIAGNOSIS — O26892 Other specified pregnancy related conditions, second trimester: Secondary | ICD-10-CM

## 2020-01-01 DIAGNOSIS — R109 Unspecified abdominal pain: Secondary | ICD-10-CM

## 2020-01-01 DIAGNOSIS — O09899 Supervision of other high risk pregnancies, unspecified trimester: Secondary | ICD-10-CM

## 2020-01-01 DIAGNOSIS — O99210 Obesity complicating pregnancy, unspecified trimester: Secondary | ICD-10-CM

## 2020-01-01 DIAGNOSIS — O99212 Obesity complicating pregnancy, second trimester: Secondary | ICD-10-CM

## 2020-01-01 DIAGNOSIS — O09892 Supervision of other high risk pregnancies, second trimester: Secondary | ICD-10-CM

## 2020-01-01 DIAGNOSIS — Z3480 Encounter for supervision of other normal pregnancy, unspecified trimester: Secondary | ICD-10-CM

## 2020-01-01 MED ORDER — COMFORT FIT MATERNITY SUPP MED MISC: 1.0000 | Freq: Every day | 0 refills | Status: DC

## 2020-01-01 NOTE — Progress Notes (Signed)
TELEHEALTH OBSTETRICS PRENATAL VIRTUAL VIDEO VISIT ENCOUNTER NOTE  Provider location: [facility] [place]   I connected with Kim Mejia on 01/01/20 at  2:00 PM EST by MyChart Video Encounter at home and verified that I am speaking with the correct person using two identifiers.   I discussed the limitations, risks, security and privacy concerns of performing an evaluation and management service virtually and the availability of in person appointments. I also discussed with the patient that there may be a patient responsible charge related to this service. The patient expressed understanding and agreed to proceed. Subjective:  Kim Mejia is a 19 y.o. [name] at [redacted]w[redacted]d being seen today for ongoing prenatal care.  She is currently monitored for the following issues for this low-risk pregnancy and has Pain of left calf; BMI (body mass index), pediatric, 95-99% for age; Language barrier; Claudication of calf muscles left; Closed fracture of distal fibula; Supervision of other normal pregnancy, antepartum; UTI in pregnancy, antepartum, first trimester; Cystic fibrosis carrier, antepartum; and Obesity in pregnancy on their problem list.  Patient reports intermittent low abdominal pressure.  Contractions: Not present. Vag. Bleeding: None.  Movement: Present. Denies any leaking of fluid.   The following portions of the patient's history were reviewed and updated as appropriate: allergies, current medications, past family history, past medical history, past social history, past surgical history and problem list.   Objective:  There were no vitals filed for this visit.  Fetal Status:     Movement: Present     General:  Alert, oriented and cooperative. Patient is in no acute distress.  Respiratory: Normal respiratory effort, no problems with respiration noted  Mental Status: Normal mood and affect. Normal behavior. Normal judgment and thought content.  Rest of physical exam  deferred due to type of encounter  Imaging: [place] MFM OB DETAIL +14 WK  Result Date: 12/18/2019 ----------------------------------------------------------------------  OBSTETRICS REPORT                       (Signed Final 12/18/2019 02:18 pm) ---------------------------------------------------------------------- Patient Info  ID #:       [id number]                          D.O.B.:  07/20/2001 (18 yrs)  Name:       Kim Mejia                  Visit Date: 12/18/2019 09:08 am ---------------------------------------------------------------------- Performed By  Performed By:     Ellin Saba        Ref. Address:     [address]  [address]                                                             [place]                                                             [address]  Attending:        Lin Landsman      [facility]  Referred By:      [facility] ---------------------------------------------------------------------- Orders   #  Description                          Code         Ordered By   1  [place] MFM OB DETAIL +14 WK              76811.01     Nolene Bernheim  ----------------------------------------------------------------------   #  Order #                    Accession #                 Episode #   1  [id number]                  [id number]                  [id number]  ---------------------------------------------------------------------- Indications   Encounter for antenatal screening for          Z36.3   malformations   [redacted] weeks gestation of pregnancy                Z3A.19   Cystic Fibrosis (CF) Carrier, second trimester O09.892  ---------------------------------------------------------------------- Fetal Evaluation  Num Of Fetuses:         1  Fetal Heart  Rate(bpm):  145  Cardiac Activity:       Observed  Presentation:           Breech  Placenta:               Posterior  P. Cord Insertion:      Visualized  Amniotic Fluid  AFI FV:      Within normal limits                              Largest Pocket(cm)                              4.62 ---------------------------------------------------------------------- Biometry  BPD:      46.4  mm  G. Age:  20w 0d         70  %    CI:        70.21   %    70 - 86                                                          FL/HC:      18.2   %    16.8 - 19.8  HC:      176.6  mm     G. Age:  20w 1d         70  %    HC/AC:      1.15        1.09 - 1.39  AC:      153.5  mm     G. Age:  20w 4d         76  %    FL/BPD:     69.2   %  FL:       32.1  mm     G. Age:  20w 0d         59  %    FL/AC:      20.9   %    20 - 24  HUM:      29.2  mm     G. Age:  19w 4d         51  %  CER:      19.5  mm     G. Age:  18w 5d         30  %  NFT:       4.2  mm  LV:        7.7  mm  CM:        5.4  mm  Est. FW:     343  gm    0 lb 12 oz      84  % ---------------------------------------------------------------------- OB History  Gravidity:    1 ---------------------------------------------------------------------- Gestational Age  Clinical EDD:  19w 4d                                        EDD:   05/09/20  U/S Today:     20w 1d                                        EDD:   05/05/20  Best:          19w 4d     Det. By:  Clinical EDD             EDD:   05/09/20 ---------------------------------------------------------------------- Anatomy  Cranium:               Appears normal         LVOT:                   Appears normal  Cavum:                 Appears normal  Aortic Arch:            Appears normal  Ventricles:            Appears normal         Ductal Arch:            Appears normal  Choroid Plexus:        Appears normal         Diaphragm:              Appears normal  Cerebellum:            Appears normal         Stomach:                Appears  normal, left                                                                        sided  Posterior Fossa:       Appears normal         Abdomen:                Appears normal  Nuchal Fold:           Appears normal         Abdominal Wall:         Appears nml (cord                                                                        insert, abd wall)  Face:                  Orbits appear          Cord Vessels:           Appears normal (3                         normal                                         vessel cord)  Lips:                  Appears normal         Kidneys:                Appear normal  Palate:                Appears normal         Bladder:                Appears normal  Thoracic:              Appears normal         Spine:                  Appears normal  Heart:                 Appears normal         Upper Extremities:      Appears normal                         (4CH, axis, and                         situs)  RVOT:                  Appears normal         Lower Extremities:      Appears normal  Other:  Female gender. Heels visualized. Technically difficult due to fetal          position. ---------------------------------------------------------------------- Cervix Uterus Adnexa  Cervix  Length:           4.16  cm.  Normal appearance by transabdominal scan.  Left Ovary  Small corpus luteum noted.  Right Ovary  Within normal limits. ---------------------------------------------------------------------- Impression  Normal anatomy and overall growth  Good fetal movement  BMI >40  Low risk Cell free DNA  Cystic fibroisis carrier, [name] is meeting with our genetic  counselor today.  We discussed the role of serial growth exams in women with  a BMI >40 in particular secondary to fetal macrosomia. ---------------------------------------------------------------------- Recommendations  Serial growth in every 4 weeks  Growth scheduled in 6 week.  ----------------------------------------------------------------------               Lin Landsmanorenthian Booker, MD Electronically Signed Final Report   12/18/2019 02:18 pm ----------------------------------------------------------------------   Assessment and Plan:  Pregnancy: [name] at 37102w4d 1. Obesity in pregnancy --On ASA, growth US Q 4 weeks  2. Cystic fibrosis carrier, antepartum   3. Supervision of other normal pregnancy, antepartum --Pt reports good fetal movement, denies cramping, LOF, or vaginal bleeding --Anticipatory guidance about next visits/weeks of pregnancy given.  4. Abdominal pain in pregnancy --Intermittent lower abdominal pressure, resolves with rest --Rest/ice/heat/warm bath/Tylenol/pregnancy support belt --Rx for support belt sent to [facility] today --PTL precautions reviewed  Preterm labor symptoms and general obstetric precautions including but not limited to vaginal bleeding, contractions, leaking of fluid and fetal movement were reviewed in detail with the patient. I discussed the assessment and treatment plan with the patient. The patient was provided an opportunity to ask questions and all were answered. The patient agreed with the plan and demonstrated an understanding of the instructions. The patient was advised to call back or seek an in-person office evaluation/[facility] for any urgent or concerning symptoms. Please refer to After Visit Summary for other counseling recommendations.   I provided 10 minutes of face-to-face time during this encounter.  Return in about 3 weeks (around 01/22/2020).  Future Appointments  Date Time Provider Department Center  01/29/2020 10:45 AM WH-MFC NURSE WH-MFC MFC-US  01/29/2020 10:45 AM WH-MFC US 2 WH-MFCUS MFC-US    Sharen CounterLisa Leftwich-Kirby, CNM [facility], [facility]

## 2020-01-03 ENCOUNTER — Telehealth (HOSPITAL_COMMUNITY): Payer: Self-pay | Admitting: Genetic Counselor

## 2020-01-03 NOTE — Telephone Encounter (Signed)
I called Ms. Villella to check in and see if she was still interested in pursuing partner carrier screening for cystic fibrosis. LVM requesting a call back to my direct line to facilitate testing.   Gershon Crane, MS Genetic Counselor

## 2020-01-22 ENCOUNTER — Telehealth (INDEPENDENT_AMBULATORY_CARE_PROVIDER_SITE_OTHER): Payer: Medicaid Other | Admitting: Family Medicine

## 2020-01-22 ENCOUNTER — Encounter: Payer: Self-pay | Admitting: Family Medicine

## 2020-01-22 DIAGNOSIS — O99210 Obesity complicating pregnancy, unspecified trimester: Secondary | ICD-10-CM

## 2020-01-22 DIAGNOSIS — O09899 Supervision of other high risk pregnancies, unspecified trimester: Secondary | ICD-10-CM

## 2020-01-22 DIAGNOSIS — Z3A24 24 weeks gestation of pregnancy: Secondary | ICD-10-CM

## 2020-01-22 DIAGNOSIS — O09892 Supervision of other high risk pregnancies, second trimester: Secondary | ICD-10-CM | POA: Diagnosis not present

## 2020-01-22 DIAGNOSIS — O99212 Obesity complicating pregnancy, second trimester: Secondary | ICD-10-CM

## 2020-01-22 DIAGNOSIS — Z3480 Encounter for supervision of other normal pregnancy, unspecified trimester: Secondary | ICD-10-CM

## 2020-01-22 NOTE — Progress Notes (Signed)
Pt is on the phone preparing for virtual visit with provider. [redacted]w[redacted]d. Pt reports that her BP cuff is broken. Advised pt call [facility] and request a new cuff, pt voices understanding. Pt denies any HA or blurry vision.

## 2020-01-22 NOTE — Patient Instructions (Addendum)
Hormonal Contraception Information Hormonal contraception is a type of birth control that uses hormones to prevent pregnancy. It usually involves a combination of the hormones estrogen and progesterone or only the hormone progesterone. Hormonal contraception works in these ways:  It thickens the mucus in the cervix, making it harder for sperm to enter the uterus.  It changes the lining of the uterus, making it harder for an egg to implant.  It may stop the ovaries from releasing eggs (ovulation). Some women who take hormonal contraceptives that contain only progesterone may continue to ovulate. Hormonal contraception cannot prevent sexually transmitted infections (STIs). Pregnancy may still occur. Estrogen and progesterone contraceptives Contraceptives that use a combination of estrogen and progesterone are available in these forms:  Pill. Pills come in different combinations of hormones. They must be taken at the same time each day. Pills can affect your period, causing you to get your period once every three months or not at all.  Patch. The patch must be worn on the lower abdomen for three weeks and then removed on the fourth.  Vaginal ring. The ring is placed in the vagina and left there for three weeks. It is then removed for one week. Progesterone contraceptives Contraceptives that use progesterone only are available in these forms:  Pill. Pills should be taken every day of the cycle.  Intrauterine device (IUD). This device is inserted into the uterus and removed or replaced every five years or sooner.  Implant. Plastic rods are placed under the skin of the upper arm. They are removed or replaced every three years or sooner.  Injection. The injection is given once every 90 days. What are the side effects? The side effects of estrogen and progesterone contraceptives include:  Nausea.  Headaches.  Breast tenderness.  Bleeding or spotting between menstrual cycles.  High blood  pressure (rare).  Strokes, heart attacks, or blood clots (rare) Side effects of progesterone-only contraceptives include:  Nausea.  Headaches.  Breast tenderness.  Unpredictable menstrual bleeding.  High blood pressure (rare). Talk to your health care provider about what side effects may affect you. Where to find more information  Ask your health care provider for more information and resources about hormonal contraception.  [place] Department of Health and Programmer, systems on Women's Health: [place] Questions to ask:  What type of hormonal contraception is right for me?  How long should I plan to use hormonal contraception?  What are the side effects of the hormonal contraception method I choose?  How can I prevent STIs while using hormonal contraception? Contact a health care provider if:  You start taking hormonal contraceptives and you develop persistent or severe side effects. Summary  Estrogen and progesterone are hormones used in many forms of birth control.  Talk to your health care provider about what side effects may affect you.  Hormonal contraception cannot prevent sexually transmitted infections (STIs).  Ask your health care provider for more information and resources about hormonal contraception. This information is not intended to replace advice given to you by your health care provider. Make sure you discuss any questions you have with your health care provider. Document Revised: 04/09/2019 Document Reviewed: 11/12/2016 Elsevier Patient Education  2020 Golden to have your son circumcised:  [facility]     [phone]   $480 while you are in hospital         [facility]              [phone]   $[phone] by 4 wks                      [facility]                     [phone]   $269 by 7 days [facility]                    [phone]   $269 by 4 wks [facility]              [phone]   $225 by 2 wks    These prices sometimes change but are roughly what you can expect to pay. Please call and confirm pricing.   Circumcision is considered an elective/non-medically necessary procedure. There are many reasons parents decide to have their sons circumsized. During the first year of life circumcised males have a reduced risk of urinary tract infections but after this year the rates between circumcised males and uncircumcised males are the same.  It is safe to have your son circumcised outside of the hospital and the places above perform them regularly.   Deciding about Circumcision in Baby Boys  (Up-to-date The Basics)  What is circumcision?   Circumcision is a surgery that removes the skin that covers the tip of the penis, called the "foreskin" Circumcision is usually done when a boy is between 52 and 46 days old. In the [place], circumcision is common. In some other countries, fewer boys are circumcised. Circumcision is a common tradition in some religions.  Should I have my baby boy circumcised?   There is no easy answer. Circumcision has some benefits. But it also has risks. After talking with your doctor, you will have to decide for yourself what is right for your family.  What are the benefits of circumcision?   Circumcised boys seem to have slightly lower rates of: ?Urinary tract infections ?Swelling of the opening at the tip of the penis Circumcised men seem to have slightly lower rates of: ?Urinary tract infections ?Swelling of the opening at the tip of the penis ?Penis cancer ?HIV and other infections that you catch during sex ?Cervical cancer in the women they have sex with Even so, in the [place], the risks of these problems are small - even in boys and men who have not been circumcised. Plus, boys and men who are not circumcised can reduce these extra risks by: ?Cleaning their penis well ?Using condoms during sex  What are the risks of  circumcision?  Risks include: ?Bleeding or infection from the surgery ?Damage to or amputation of the penis ?A chance that the doctor will cut off too much or not enough of the foreskin ?A chance that sex won't feel as good later in life Only about 1 out of every 200 circumcisions leads to problems. There is also a chance that your health insurance won't pay for circumcision.  How is circumcision done in baby boys?  First, the baby gets medicine for pain relief. This might be a cream on the skin or a shot into the base of the penis. Next, the doctor cleans the baby's penis well. Then he or she uses special tools to cut off the foreskin. Finally, the doctor  wraps a bandage (called gauze) around the baby's penis. If you have your baby circumcised, his doctor or nurse will give you instructions on how to care for him after the surgery. It is important that you follow those instructions carefully.

## 2020-01-22 NOTE — Progress Notes (Signed)
   TELEHEALTH VIRTUAL OBSTETRICS VISIT ENCOUNTER NOTE  I connected with Kim Mejia on 01/22/20 at  1:45 PM EST by telephone at home and verified that I am speaking with the correct person using two identifiers.   I discussed the limitations, risks, security and privacy concerns of performing an evaluation and management service by telephone and the availability of in person appointments. I also discussed with the patient that there may be a patient responsible charge related to this service. The patient expressed understanding and agreed to proceed.  Subjective:  Kim Mejia is a 19 y.o. [name] at [redacted]w[redacted]d being followed for ongoing prenatal care.  She is currently monitored for the following issues for this low-risk pregnancy and has Pain of left calf; BMI (body mass index), pediatric, 95-99% for age; Language barrier; Claudication of calf muscles left; Closed fracture of distal fibula; Supervision of other normal pregnancy, antepartum; UTI in pregnancy, antepartum, first trimester; Cystic fibrosis carrier, antepartum; and Obesity in pregnancy on their problem list.  Patient reports no complaints. Reports fetal movement. Denies any contractions, bleeding or leaking of fluid.   The following portions of the patient's history were reviewed and updated as appropriate: allergies, current medications, past family history, past medical history, past social history, past surgical history and problem list.   Objective:   General:  Alert, oriented and cooperative.   Mental Status: Normal mood and affect perceived. Normal judgment and thought content.  Rest of physical exam deferred due to type of encounter  Assessment and Plan:  Pregnancy: [name] at [redacted]w[redacted]d 1. Supervision of other normal pregnancy, antepartum - Continue routine prenatal care - Discussed circumcision information and birth control options  2. Cystic fibrosis carrier, antepartum  3. Obesity in pregnancy  Preterm labor symptoms and  general obstetric precautions including but not limited to vaginal bleeding, contractions, leaking of fluid and fetal movement were reviewed in detail with the patient.  I discussed the assessment and treatment plan with the patient. The patient was provided an opportunity to ask questions and all were answered. The patient agreed with the plan and demonstrated an understanding of the instructions. The patient was advised to call back or seek an in-person office evaluation/go to MAU at [facility] for any urgent or concerning symptoms. Please refer to After Visit Summary for other counseling recommendations.   I provided 13 minutes of non-face-to-face time during this encounter.  Return in about 2 weeks (around 02/05/2020) for ROB, in person, 2 hour GTT.  Future Appointments  Date Time Provider Department Center  01/22/2020  1:45 PM Aitana Burry, Margarette Asal, DO [facility] None  01/29/2020 10:45 AM [facility] NURSE [facility]  01/29/2020 10:45 AM [facility] [place] 2 [facility]    Marlowe Alt, DO Center for [employer], [facility]

## 2020-01-29 ENCOUNTER — Ambulatory Visit (HOSPITAL_COMMUNITY)
Admission: RE | Admit: 2020-01-29 | Discharge: 2020-01-29 | Disposition: A | Discharge status: Home / Self Care | Payer: Medicaid Other | Source: Ambulatory Visit | Attending: Obstetrics and Gynecology | Admitting: Obstetrics and Gynecology

## 2020-01-29 ENCOUNTER — Ambulatory Visit (HOSPITAL_COMMUNITY): Payer: Medicaid Other | Admitting: *Deleted

## 2020-01-29 ENCOUNTER — Other Ambulatory Visit (HOSPITAL_COMMUNITY): Payer: Self-pay | Admitting: *Deleted

## 2020-01-29 ENCOUNTER — Other Ambulatory Visit: Payer: Self-pay

## 2020-01-29 ENCOUNTER — Encounter (HOSPITAL_COMMUNITY): Payer: Self-pay

## 2020-01-29 DIAGNOSIS — O99212 Obesity complicating pregnancy, second trimester: Secondary | ICD-10-CM | POA: Diagnosis not present

## 2020-01-29 DIAGNOSIS — Z141 Cystic fibrosis carrier: Secondary | ICD-10-CM | POA: Diagnosis not present

## 2020-01-29 DIAGNOSIS — Z3480 Encounter for supervision of other normal pregnancy, unspecified trimester: Secondary | ICD-10-CM

## 2020-01-29 DIAGNOSIS — O99210 Obesity complicating pregnancy, unspecified trimester: Secondary | ICD-10-CM | POA: Diagnosis not present

## 2020-01-29 DIAGNOSIS — Z362 Encounter for other antenatal screening follow-up: Secondary | ICD-10-CM

## 2020-01-29 DIAGNOSIS — O09899 Supervision of other high risk pregnancies, unspecified trimester: Secondary | ICD-10-CM | POA: Diagnosis not present

## 2020-01-29 DIAGNOSIS — Z3A25 25 weeks gestation of pregnancy: Secondary | ICD-10-CM | POA: Diagnosis not present

## 2020-01-29 DIAGNOSIS — Z6841 Body Mass Index (BMI) 40.0 and over, adult: Secondary | ICD-10-CM

## 2020-02-05 ENCOUNTER — Other Ambulatory Visit: Payer: Self-pay

## 2020-02-05 ENCOUNTER — Other Ambulatory Visit: Payer: Medicaid Other

## 2020-02-05 ENCOUNTER — Ambulatory Visit (INDEPENDENT_AMBULATORY_CARE_PROVIDER_SITE_OTHER): Payer: Medicaid Other | Admitting: Women's Health

## 2020-02-05 VITALS — BP 100/68 | HR 79 | Wt 240.7 lb

## 2020-02-05 DIAGNOSIS — Z3A26 26 weeks gestation of pregnancy: Secondary | ICD-10-CM

## 2020-02-05 DIAGNOSIS — Z3480 Encounter for supervision of other normal pregnancy, unspecified trimester: Secondary | ICD-10-CM

## 2020-02-05 DIAGNOSIS — O99212 Obesity complicating pregnancy, second trimester: Secondary | ICD-10-CM

## 2020-02-05 DIAGNOSIS — O99891 Other specified diseases and conditions complicating pregnancy: Secondary | ICD-10-CM

## 2020-02-05 DIAGNOSIS — O09892 Supervision of other high risk pregnancies, second trimester: Secondary | ICD-10-CM

## 2020-02-05 DIAGNOSIS — O99210 Obesity complicating pregnancy, unspecified trimester: Secondary | ICD-10-CM

## 2020-02-05 DIAGNOSIS — O2342 Unspecified infection of urinary tract in pregnancy, second trimester: Secondary | ICD-10-CM

## 2020-02-05 DIAGNOSIS — R252 Cramp and spasm: Secondary | ICD-10-CM

## 2020-02-05 DIAGNOSIS — O09899 Supervision of other high risk pregnancies, unspecified trimester: Secondary | ICD-10-CM

## 2020-02-05 DIAGNOSIS — O2341 Unspecified infection of urinary tract in pregnancy, first trimester: Secondary | ICD-10-CM

## 2020-02-05 MED ORDER — ASPIRIN EC 81 MG PO TBEC: 81.0000 mg | DELAYED_RELEASE_TABLET | Freq: Every day | ORAL | 4 refills | Status: DC

## 2020-02-05 NOTE — Progress Notes (Signed)
Subjective:  Kim Mejia is a 19 y.o. [name] at [redacted]w[redacted]d being seen today for ongoing prenatal care.  She is currently monitored for the following issues for this low-risk pregnancy and has Pain of left calf; BMI (body mass index), pediatric, 95-99% for age; Language barrier; Claudication of calf muscles left; Closed fracture of distal fibula; Supervision of other normal pregnancy, antepartum; UTI in pregnancy, antepartum, first trimester; Cystic fibrosis carrier, antepartum; and Obesity in pregnancy on their problem list.  Patient reports bilateral leg cramps at night, not every night, pt reports when she gets up and moves/stretches the cramps resolve, denies pain/redness/swelling.  Contractions: Not present. Vag. Bleeding: None.  Movement: Present. Denies leaking of fluid.   The following portions of the patient's history were reviewed and updated as appropriate: allergies, current medications, past family history, past medical history, past social history, past surgical history and problem list. Problem list updated.  Objective:   Vitals:   02/05/20 0855  BP: 100/68  Pulse: 79  Weight: 240 lb 11.2 oz (109.2 kg)    Fetal Status: Fetal Heart Rate (bpm): 148 Fundal Height: 29 cm Movement: Present     General:  Alert, oriented and cooperative. Patient is in no acute distress.  Skin: Skin is warm and dry. No rash noted.   Cardiovascular: Normal heart rate noted  Respiratory: Normal respiratory effort, no problems with respiration noted  Abdomen: Soft, gravid, appropriate for gestational age. Pain/Pressure: Present     Pelvic: Vag. Bleeding: None     Cervical exam deferred        Extremities: Normal range of motion.  Edema: None  Mental Status: Normal mood and affect. Normal behavior. Normal judgment and thought content.    Assessment and Plan:  Pregnancy: [name] at [redacted]w[redacted]d  1. Supervision of other normal pregnancy, antepartum -glucose/HIV/RPR/CBC today -Tdap declined, information given and  importance discussed, pt advised she can opt for Tdap at any time -peds list given  2. Obesity in pregnancy -growth scan scheduled 03/18/3030, pt aware -pt reports she is not taking ASA daily as a prescription was never sent in for her, RX sent today, pt encouraged to start for preeclampsia prevention  3. Cystic fibrosis carrier, antepartum -genetic counseling 12/18/2019 -partner testing has not been completed, and patient and partner are still discussing if they would like to move forward with testing  4. UTI in pregnancy, antepartum, first trimester -TOC negative 11/14/2019  5. Leg cramps in pregnancy -legs appear normal today -discussed symptomatic treatment, pt declines medication options at this time  Preterm labor symptoms and general obstetric precautions including but not limited to vaginal bleeding, contractions, leaking of fluid and fetal movement were reviewed in detail with the patient. I discussed the assessment and treatment plan with the patient. The patient was provided an opportunity to ask questions and all were answered. The patient agreed with the plan and demonstrated an understanding of the instructions. The patient was advised to call back or seek an in-person office evaluation/go to [facility] at [facility] for any urgent or concerning symptoms. Please refer to After Visit Summary for other counseling recommendations.  Return in about 2 weeks (around 02/19/2020) for virtual ROB.   Daxon Kyne, Odie Sera, NP

## 2020-02-05 NOTE — Patient Instructions (Addendum)
Leg Cramps Leg cramps occur when one or more muscles tighten and you have no control over this tightening (involuntary muscle contraction). Muscle cramps can develop in any muscle, but the most common place is in the calf muscles of the leg. Those cramps can occur during exercise or when you are at rest. Leg cramps are painful, and they may last for a few seconds to a few minutes. Cramps may return several times before they finally stop. Usually, leg cramps are not caused by a serious medical problem. In many cases, the cause is not known. Some common causes include:  Excessive physical effort (overexertion), such as during intense exercise.  Overuse from repetitive motions, or doing the same thing over and over.  Staying in a certain position for a long period of time.  Improper preparation, form, or technique while performing a sport or an activity.  Dehydration.  Injury.  Side effects of certain medicines.  Abnormally low levels of minerals in your blood (electrolytes), especially potassium and calcium. This could result from: ? Pregnancy. ? Taking diuretic medicines. Follow these instructions at home: Eating and drinking  Drink enough fluid to keep your urine pale yellow. Staying hydrated may help prevent cramps.  Eat a healthy diet that includes plenty of nutrients to help your muscles function. A healthy diet includes fruits and vegetables, lean protein, whole grains, and low-fat or nonfat dairy products. Managing pain, stiffness, and swelling      Try massaging, stretching, and relaxing the affected muscle. Do this for several minutes at a time.  If directed, put ice on areas that are sore or painful after a cramp: ? Put ice in a plastic bag. ? Place a towel between your skin and the bag. ? Leave the ice on for 20 minutes, 2-3 times a day.  If directed, apply heat to muscles that are tense or tight. Do this before you exercise, or as often as told by your health care  provider. Use the heat source that your health care provider recommends, such as a moist heat pack or a heating pad. ? Place a towel between your skin and the heat source. ? Leave the heat on for 20-30 minutes. ? Remove the heat if your skin turns bright red. This is especially important if you are unable to feel pain, heat, or cold. You may have a greater risk of getting burned.  Try taking hot showers or baths to help relax tight muscles. General instructions  If you are having frequent leg cramps, avoid intense exercise for several days.  Take over-the-counter and prescription medicines only as told by your health care provider.  Keep all follow-up visits as told by your health care provider. This is important. Contact a health care provider if:  Your leg cramps get more severe or more frequent, or they do not improve over time.  Your foot becomes cold, numb, or blue. Summary  Muscle cramps can develop in any muscle, but the most common place is in the calf muscles of the leg.  Leg cramps are painful, and they may last for a few seconds to a few minutes.  Usually, leg cramps are not caused by a serious medical problem. Often, the cause is not known.  Stay hydrated and take over-the-counter and prescription medicines only as told by your health care provider. This information is not intended to replace advice given to you by your health care provider. Make sure you discuss any questions you have with your health care  provider. Document Revised: 11/25/2017 Document Reviewed: 09/22/2017 Elsevier Patient Education  2020 Elsevier Inc.  AREA PEDIATRIC/FAMILY PRACTICE PHYSICIANS  [facility] [address] [place], [place] [address] Phone - [phone]   Fax - [phone]  JACK AMOS [address] [place], [place]  [address] Phone - [phone]   Fax - [phone]  [facility] [address] [place], [place]  [address] Phone -  [phone]   Fax - [phone]  [facility] [address] [place], [place]  [address] Phone - [phone]   Fax - [phone]  [facility] [address] [place], [place]  [address] Phone - [phone]   Fax - [phone]  [facility] [address] [place], [place]  [address] Phone - [phone]   Fax - [phone]  [facility] [place] [address] [place], [place]  [address] Phone - [phone]   Fax - [phone]  [facility] [address] [place] [place], [place]  [address] Phone - [phone]   Fax - [phone]  [facility] AT [facility] [address] [place], [place]  [address] Phone - [phone]   Fax - [phone] [facility] [place] [address] [place], [place]  [address] Phone - [phone]   Fax - [phone]  [facility] [address] [place], [place]  [address] Phone - [phone]   Fax - [phone]  [facility] [address] [place], [place]  [address] Phone - [phone]   Fax - [phone]  [facility] [place] [address] [place], [place]  [address] Phone - [phone]   Fax - [phone]  [facility] [address] [place], [place]  [address] Phone - [phone]  [facility] [address] [place], [place]  [address] Phone - [phone]   Fax - [phone]  [facility] [address] [place], [place]  [address] Phone - [phone]   Fax - [phone]  [facility] [address] [place], [place]  [address] Phone - [phone]   Fax - [phone]  [facility] [address] [place], [place]  [address] Phone - [phone]   Fax - [phone]   [facility] [address]  [place] [address] [place], [place]  [address] Phone - [phone]   Fax - [phone]  [facility] [address] [place], [place]  [address] Phone - [phone]   Fax - [phone]  DAVID RUBIN [address] [place], [place]  [address] Phone - [phone]   Fax - [phone]  [facility] [address] [place], [place]  [address] Phone - [phone]   Fax - [phone]  [facility] [facility] Al[facility] Ch[facility] [address] [place], [place]  [address] Phone - [phone]   Fax - [phone] Gerarda Fraction [address] [place], [place]  [address] Phone - [phone]   Fax - [phone]  [facility] [address] [place], [place]  [address] Phone - [phone]   Fax - [phone]  [facility] [place] [address] [place], [place]  [address] Phone - [phone]   Fax - [phone]    https://www.cdc.gov/vaccines/hcp/vis/vis-statements/tdap.pdf">  Tdap (Tetanus, Diphtheria, Pertussis) Vaccine: What You Need to Know 1. Why get vaccinated?  Tdap vaccine can prevent tetanus, diphtheria, and pertussis. Diphtheria and pertussis spread from person to person. Tetanus enters the body through cuts or wounds.  TETANUS (T) causes painful stiffening of the muscles. Tetanus can lead to serious health problems, including being unable to open the mouth, having trouble swallowing and breathing, or death.  DIPHTHERIA (D) can lead to difficulty breathing, heart failure, paralysis, or death.  PERTUSSIS (aP), also known as "whooping cough," can cause uncontrollable, violent coughing which makes it hard to breathe, eat, or drink. Pertussis can be extremely serious in babies and young children, causing pneumonia, convulsions, brain damage, or death. In teens and adults, it can cause weight loss, loss of bladder control, passing out, and rib fractures from severe  coughing. 2. Tdap vaccine Tdap is only for children 7 years and older, adolescents, and adults.  Adolescents should receive a single dose of Tdap, preferably at age 88 or 12 years. Pregnant women should get a dose of Tdap during every pregnancy, to protect the newborn from pertussis. Infants are most at risk for severe, life-threatening complications from pertussis. Adults who have never received Tdap should get a dose of Tdap. Also, adults should receive a booster dose every 10 years, or earlier in the case of a severe and dirty wound or burn. Booster doses can be either Tdap or Td (a different vaccine that protects against tetanus and diphtheria but not pertussis). Tdap may be given at the same time as other vaccines. 3. Talk with your health care provider Tell your vaccine provider if the person getting the vaccine:  Has had an allergic reaction after a previous dose of any vaccine that protects against tetanus, diphtheria, or pertussis, or has any severe, life-threatening allergies.  Has had a coma, decreased level of consciousness, or prolonged seizures within 7 days after a previous dose of any pertussis vaccine (DTP, DTaP, or Tdap).  Has seizures or another nervous system problem.  Has ever had Guillain-Barr Syndrome (also called GBS).  Has had severe pain or swelling after a previous dose of any vaccine that protects against tetanus or diphtheria. In some cases, your health care provider may decide to postpone Tdap vaccination to a future visit.  People with minor illnesses, such as a cold, may be vaccinated. People who are moderately or severely ill should usually wait until they recover before getting Tdap vaccine.  Your health care provider can give you more information. 4. Risks of a vaccine reaction  Pain, redness, or swelling where the shot was given, mild fever, headache, feeling tired, and nausea, vomiting, diarrhea, or stomachache sometimes happen after Tdap  vaccine. People sometimes faint after medical procedures, including vaccination. Tell your provider if you feel dizzy or have vision changes or ringing in the ears.  As with any medicine, there is a very remote chance of a vaccine causing a severe allergic reaction, other serious injury, or death. 5. What if there is a serious problem? An allergic reaction could occur after the vaccinated person leaves the clinic. If you see signs of a severe allergic reaction (hives, swelling of the face and throat, difficulty breathing, a fast heartbeat, dizziness, or weakness), call 9-1-1 and get the person to the nearest hospital. For other signs that concern you, call your health care provider.  Adverse reactions should be reported to the Vaccine Adverse Event Reporting System (VAERS). Your health care provider will usually file this report, or you can do it yourself. Visit the VAERS website at www.vaers.LAgents.no or call (402) 340-7937.  VAERS is only for reporting reactions, and VAERS staff do not give medical advice. 6. The National Vaccine Injury Compensation Program The [employer] Vaccine Injury Compensation Program (VICP) is a federal program that was created to compensate people who may have been injured by certain vaccines. Visit the VICP website at GoldCloset.com.ee or call 443-494-6860 to learn about the program and about filing a claim. There is a time limit to file a claim for compensation. 7. How can I learn more?  Ask your health care provider.  Call your local or state health department.  Contact the Centers for Disease Control and Prevention (CDC): ? Call 615-746-6925 (1-800-CDC-INFO) or ? Visit CDC's website at http://hunter.com/ Vaccine Information Statement Tdap (Tetanus, Diphtheria, Pertussis) Vaccine (03/28/2019) This information is not intended to replace advice given to you by your health care provider. Make sure you discuss any questions you have with your health care  provider. Document Revised: 04/06/2019 Document Reviewed: 04/09/2019 Elsevier Patient Education  2020 [place] Assessment Unit (MAU)  The Maternity Assessment Unit (MAU) is located at the [facility]. The address is: [address], [place], [place], [place] [address]. Please see map below for additional directions.    The Maternity Assessment Unit is designed to help you during your pregnancy, and for up to 6 weeks after delivery, with any pregnancy- or postpartum-related emergencies, if you think you are in labor, or if your water has broken. For example, if you experience nausea and vomiting, vaginal bleeding, severe abdominal or pelvic pain, elevated blood pressure or other problems related to your pregnancy or postpartum time, please come to the Maternity Assessment Unit for assistance.  Third Trimester of Pregnancy The third trimester is from week 28 through week 40 (months 7 through 9). The third trimester is a time when the unborn baby (fetus) is growing rapidly. At the end of the ninth month, the fetus is about 20 inches in length and weighs 6-10 pounds. Body changes during your third trimester Your body will continue to go through many changes during pregnancy. The changes vary from woman to woman. During the third trimester:  Your weight will continue to increase. You can expect to gain 25-35 pounds (11-16 kg) by the end of the pregnancy.  You may begin to get stretch marks on your hips, abdomen, and breasts.  You may urinate more often because the fetus is moving lower into your pelvis and pressing on your bladder.  You may develop or continue to have heartburn. This is caused by increased hormones that slow down muscles in the digestive tract.  You may develop or continue to have constipation because increased hormones slow digestion and cause the muscles that push waste through your intestines to relax.  You  may develop hemorrhoids. These are swollen veins (varicose veins) in the rectum that can itch or be painful.  You may develop swollen, bulging veins (varicose veins) in your legs.  You may have increased body aches in the pelvis, back, or thighs. This is due to weight gain and increased hormones that are relaxing your joints.  You may have changes in your hair. These can include thickening of your hair, rapid growth, and changes in texture. Some women also have hair loss during or after pregnancy, or hair that feels dry or thin. Your hair will most likely return to normal after your baby is born.  Your breasts will continue to grow and they will continue to become  tender. A yellow fluid (colostrum) may leak from your breasts. This is the first milk you are producing for your baby.  Your belly button may stick out.  You may notice more swelling in your hands, face, or ankles.  You may have increased tingling or numbness in your hands, arms, and legs. The skin on your belly may also feel numb.  You may feel short of breath because of your expanding uterus.  You may have more problems sleeping. This can be caused by the size of your belly, increased need to urinate, and an increase in your body's metabolism.  You may notice the fetus "dropping," or moving lower in your abdomen (lightening).  You may have increased vaginal discharge.  You may notice your joints feel loose and you may have pain around your pelvic bone. What to expect at prenatal visits You will have prenatal exams every 2 weeks until week 36. Then you will have weekly prenatal exams. During a routine prenatal visit:  You will be weighed to make sure you and the baby are growing normally.  Your blood pressure will be taken.  Your abdomen will be measured to track your baby's growth.  The fetal heartbeat will be listened to.  Any test results from the previous visit will be discussed.  You may have a cervical check  near your due date to see if your cervix has softened or thinned (effaced).  You will be tested for Group B streptococcus. This happens between 35 and 37 weeks. Your health care provider may ask you:  What your birth plan is.  How you are feeling.  If you are feeling the baby move.  If you have had any abnormal symptoms, such as leaking fluid, bleeding, severe headaches, or abdominal cramping.  If you are using any tobacco products, including cigarettes, chewing tobacco, and electronic cigarettes.  If you have any questions. Other tests or screenings that may be performed during your third trimester include:  Blood tests that check for low iron levels (anemia).  Fetal testing to check the health, activity level, and growth of the fetus. Testing is done if you have certain medical conditions or if there are problems during the pregnancy.  Nonstress test (NST). This test checks the health of your baby to make sure there are no signs of problems, such as the baby not getting enough oxygen. During this test, a belt is placed around your belly. The baby is made to move, and its heart rate is monitored during movement. What is false labor? False labor is a condition in which you feel small, irregular tightenings of the muscles in the womb (contractions) that usually go away with rest, changing position, or drinking water. These are called Braxton Hicks contractions. Contractions may last for hours, days, or even weeks before true labor sets in. If contractions come at regular intervals, become more frequent, increase in intensity, or become painful, you should see your health care provider. What are the signs of labor?  Abdominal cramps.  Regular contractions that start at 10 minutes apart and become stronger and more frequent with time.  Contractions that start on the top of the uterus and spread down to the lower abdomen and back.  Increased pelvic pressure and dull back pain.  A  watery or bloody mucus discharge that comes from the vagina.  Leaking of amniotic fluid. This is also known as your "water breaking." It could be a slow trickle or a gush. Let your health care  provider know if it has a color or strange odor. If you have any of these signs, call your health care provider right away, even if it is before your due date. Follow these instructions at home: Medicines  Follow your health care provider's instructions regarding medicine use. Specific medicines may be either safe or unsafe to take during pregnancy.  Take a prenatal vitamin that contains at least 600 micrograms (mcg) of folic acid.  If you develop constipation, try taking a stool softener if your health care provider approves. Eating and drinking   Eat a balanced diet that includes fresh fruits and vegetables, whole grains, good sources of protein such as meat, eggs, or tofu, and low-fat dairy. Your health care provider will help you determine the amount of weight gain that is right for you.  Avoid raw meat and uncooked cheese. These carry germs that can cause birth defects in the baby.  If you have low calcium intake from food, talk to your health care provider about whether you should take a daily calcium supplement.  Eat four or five small meals rather than three large meals a day.  Limit foods that are high in fat and processed sugars, such as fried and sweet foods.  To prevent constipation: ? Drink enough fluid to keep your urine clear or pale yellow. ? Eat foods that are high in fiber, such as fresh fruits and vegetables, whole grains, and beans. Activity  Exercise only as directed by your health care provider. Most women can continue their usual exercise routine during pregnancy. Try to exercise for 30 minutes at least 5 days a week. Stop exercising if you experience uterine contractions.  Avoid heavy lifting.  Do not exercise in extreme heat or humidity, or at high altitudes.  Wear  low-heel, comfortable shoes.  Practice good posture.  You may continue to have sex unless your health care provider tells you otherwise. Relieving pain and discomfort  Take frequent breaks and rest with your legs elevated if you have leg cramps or low back pain.  Take warm sitz baths to soothe any pain or discomfort caused by hemorrhoids. Use hemorrhoid cream if your health care provider approves.  Wear a good support bra to prevent discomfort from breast tenderness.  If you develop varicose veins: ? Wear support pantyhose or compression stockings as told by your healthcare provider. ? Elevate your feet for 15 minutes, 3-4 times a day. Prenatal care  Write down your questions. Take them to your prenatal visits.  Keep all your prenatal visits as told by your health care provider. This is important. Safety  Wear your seat belt at all times when driving.  Make a list of emergency phone numbers, including numbers for family, friends, the hospital, and police and fire departments. General instructions  Avoid cat litter boxes and soil used by cats. These carry germs that can cause birth defects in the baby. If you have a cat, ask someone to clean the litter box for you.  Do not travel far distances unless it is absolutely necessary and only with the approval of your health care provider.  Do not use hot tubs, steam rooms, or saunas.  Do not drink alcohol.  Do not use any products that contain nicotine or tobacco, such as cigarettes and e-cigarettes. If you need help quitting, ask your health care provider.  Do not use any medicinal herbs or unprescribed drugs. These chemicals affect the formation and growth of the baby.  Do not douche or  use tampons or scented sanitary pads.  Do not cross your legs for long periods of time.  To prepare for the arrival of your baby: ? Take prenatal classes to understand, practice, and ask questions about labor and delivery. ? Make a trial run  to the hospital. ? Visit the hospital and tour the maternity area. ? Arrange for maternity or paternity leave through employers. ? Arrange for family and friends to take care of pets while you are in the hospital. ? Purchase a rear-facing car seat and make sure you know how to install it in your car. ? Pack your hospital bag. ? Prepare the baby's nursery. Make sure to remove all pillows and stuffed animals from the baby's crib to prevent suffocation.  Visit your dentist if you have not gone during your pregnancy. Use a soft toothbrush to brush your teeth and be gentle when you floss. Contact a health care provider if:  You are unsure if you are in labor or if your water has broken.  You become dizzy.  You have mild pelvic cramps, pelvic pressure, or nagging pain in your abdominal area.  You have lower back pain.  You have persistent nausea, vomiting, or diarrhea.  You have an unusual or bad smelling vaginal discharge.  You have pain when you urinate. Get help right away if:  Your water breaks before 37 weeks.  You have regular contractions less than 5 minutes apart before 37 weeks.  You have a fever.  You are leaking fluid from your vagina.  You have spotting or bleeding from your vagina.  You have severe abdominal pain or cramping.  You have rapid weight loss or weight gain.  You have shortness of breath with chest pain.  You notice sudden or extreme swelling of your face, hands, ankles, feet, or legs.  Your baby makes fewer than 10 movements in 2 hours.  You have severe headaches that do not go away when you take medicine.  You have vision changes. Summary  The third trimester is from week 28 through week 40, months 7 through 9. The third trimester is a time when the unborn baby (fetus) is growing rapidly.  During the third trimester, your discomfort may increase as you and your baby continue to gain weight. You may have abdominal, leg, and back pain, sleeping  problems, and an increased need to urinate.  During the third trimester your breasts will keep growing and they will continue to become tender. A yellow fluid (colostrum) may leak from your breasts. This is the first milk you are producing for your baby.  False labor is a condition in which you feel small, irregular tightenings of the muscles in the womb (contractions) that eventually go away. These are called Braxton Hicks contractions. Contractions may last for hours, days, or even weeks before true labor sets in.  Signs of labor can include: abdominal cramps; regular contractions that start at 10 minutes apart and become stronger and more frequent with time; watery or bloody mucus discharge that comes from the vagina; increased pelvic pressure and dull back pain; and leaking of amniotic fluid. This information is not intended to replace advice given to you by your health care provider. Make sure you discuss any questions you have with your health care provider. Document Revised: 04/05/2019 Document Reviewed: 01/18/2017 Elsevier Patient Education  2020 Elsevier Inc.  Preeclampsia and Eclampsia Preeclampsia is a serious condition that may develop during pregnancy. This condition causes high blood pressure and increased protein  in your urine along with other symptoms, such as headaches and vision changes. These symptoms may develop as the condition gets worse. Preeclampsia may occur at 20 weeks of pregnancy or later. Diagnosing and treating preeclampsia early is very important. If not treated early, it can cause serious problems for you and your baby. One problem it can lead to is eclampsia. Eclampsia is a condition that causes muscle jerking or shaking (convulsions or seizures) and other serious problems for the mother. During pregnancy, delivering your baby may be the best treatment for preeclampsia or eclampsia. For most women, preeclampsia and eclampsia symptoms go away after giving birth. In rare  cases, a woman may develop preeclampsia after giving birth (postpartum preeclampsia). This usually occurs within 48 hours after childbirth but may occur up to 6 weeks after giving birth. What are the causes? The cause of preeclampsia is not known. What increases the risk? The following risk factors make you more likely to develop preeclampsia:  Being pregnant for the first time.  Having had preeclampsia during a past pregnancy.  Having a family history of preeclampsia.  Having high blood pressure.  Being pregnant with more than one baby.  Being 46 or older.  Being African-American.  Having kidney disease or diabetes.  Having medical conditions such as lupus or blood diseases.  Being very overweight (obese). What are the signs or symptoms? The most common symptoms are:  Severe headaches.  Vision problems, such as blurred or double vision.  Abdominal pain, especially upper abdominal pain. Other symptoms that may develop as the condition gets worse include:  Sudden weight gain.  Sudden swelling of the hands, face, legs, and feet.  Severe nausea and vomiting.  Numbness in the face, arms, legs, and feet.  Dizziness.  Urinating less than usual.  Slurred speech.  Convulsions or seizures. How is this diagnosed? There are no screening tests for preeclampsia. Your health care provider will ask you about symptoms and check for signs of preeclampsia during your prenatal visits. You may also have tests that include:  Checking your blood pressure.  Urine tests to check for protein. Your health care provider will check for this at every prenatal visit.  Blood tests.  Monitoring your baby's heart rate.  Ultrasound. How is this treated? You and your health care provider will determine the treatment approach that is best for you. Treatment may include:  Having more frequent prenatal exams to check for signs of preeclampsia, if you have an increased risk for  preeclampsia.  Medicine to lower your blood pressure.  Staying in the hospital, if your condition is severe. There, treatment will focus on controlling your blood pressure and the amount of fluids in your body (fluid retention).  Taking medicine (magnesium sulfate) to prevent seizures. This may be given as an injection or through an IV.  Taking a low-dose aspirin during your pregnancy.  Delivering your baby early. You may have your labor started with medicine (induced), or you may have a cesarean delivery. Follow these instructions at home: Eating and drinking   Drink enough fluid to keep your urine pale yellow.  Avoid caffeine. Lifestyle  Do not use any products that contain nicotine or tobacco, such as cigarettes and e-cigarettes. If you need help quitting, ask your health care provider.  Do not use alcohol or drugs.  Avoid stress as much as possible. Rest and get plenty of sleep. General instructions  Take over-the-counter and prescription medicines only as told by your health care provider.  When  lying down, lie on your left side. This keeps pressure off your major blood vessels.  When sitting or lying down, raise (elevate) your feet. Try putting some pillows underneath your lower legs.  Exercise regularly. Ask your health care provider what kinds of exercise are best for you.  Keep all follow-up and prenatal visits as told by your health care provider. This is important. How is this prevented? There is no known way of preventing preeclampsia or eclampsia from developing. However, to lower your risk of complications and detect problems early:  Get regular prenatal care. Your health care provider may be able to diagnose and treat the condition early.  Maintain a healthy weight. Ask your health care provider for help managing weight gain during pregnancy.  Work with your health care provider to manage any long-term (chronic) health conditions you have, such as diabetes or  kidney problems.  You may have tests of your blood pressure and kidney function after giving birth.  Your health care provider may have you take low-dose aspirin during your next pregnancy. Contact a health care provider if:  You have symptoms that your health care provider told you may require more treatment or monitoring, such as: ? Headaches. ? Nausea or vomiting. ? Abdominal pain. ? Dizziness. ? Light-headedness. Get help right away if:  You have severe: ? Abdominal pain. ? Headaches that do not get better. ? Dizziness. ? Vision problems. ? Confusion. ? Nausea or vomiting.  You have any of the following: ? A seizure. ? Sudden, rapid weight gain. ? Sudden swelling in your hands, ankles, or face. ? Trouble moving any part of your body. ? Numbness in any part of your body. ? Trouble speaking. ? Abnormal bleeding.  You faint. Summary  Preeclampsia is a serious condition that may develop during pregnancy.  This condition causes high blood pressure and increased protein in your urine along with other symptoms, such as headaches and vision changes.  Diagnosing and treating preeclampsia early is very important. If not treated early, it can cause serious problems for you and your baby.  Get help right away if you have symptoms that your health care provider told you to watch for. This information is not intended to replace advice given to you by your health care provider. Make sure you discuss any questions you have with your health care provider. Document Revised: 08/15/2018 Document Reviewed: 07/19/2016 Elsevier Patient Education  2020 [employer].

## 2020-02-06 LAB — RPR: RPR Ser Ql: NONREACTIVE

## 2020-02-06 LAB — HIV ANTIBODY (ROUTINE TESTING W REFLEX): HIV Screen 4th Generation wRfx: NONREACTIVE

## 2020-02-06 LAB — CBC
Hematocrit: 30.6 % — ABNORMAL LOW (ref 34.0–46.6)
Hemoglobin: 9.8 g/dL — ABNORMAL LOW (ref 11.1–15.9)
MCH: 27.5 pg (ref 26.6–33.0)
MCHC: 32 g/dL (ref 31.5–35.7)
MCV: 86 fL (ref 79–97)
Platelets: 244 10*3/uL (ref 150–450)
RBC: 3.56 x10E6/uL — ABNORMAL LOW (ref 3.77–5.28)
RDW: 12.5 % (ref 11.7–15.4)
WBC: 9.4 10*3/uL (ref 3.4–10.8)

## 2020-02-06 LAB — GLUCOSE TOLERANCE, 2 HOURS W/ 1HR
Glucose, 1 hour: 121 mg/dL (ref 65–179)
Glucose, 2 hour: 85 mg/dL (ref 65–152)
Glucose, Fasting: 82 mg/dL (ref 65–91)

## 2020-02-11 ENCOUNTER — Telehealth: Payer: Self-pay

## 2020-02-11 ENCOUNTER — Other Ambulatory Visit: Payer: Self-pay

## 2020-02-11 ENCOUNTER — Encounter: Payer: Self-pay | Admitting: Women's Health

## 2020-02-11 DIAGNOSIS — O99019 Anemia complicating pregnancy, unspecified trimester: Secondary | ICD-10-CM

## 2020-02-11 MED ORDER — FERROUS SULFATE 325 (65 FE) MG PO TABS: 325.0000 mg | ORAL_TABLET | Freq: Two times a day (BID) | ORAL | 1 refills | Status: DC

## 2020-02-11 NOTE — Progress Notes (Signed)
Hello - pt is anemic on 28wk labs. Please call to make her aware and send iron/nutrition consult per protocol. Thank you, Joni Reining

## 2020-02-11 NOTE — Telephone Encounter (Signed)
TC to pt to make aware of Results pt not ava will send MyChart message.  Left detailed message for pt to return call to office.

## 2020-02-11 NOTE — Telephone Encounter (Signed)
-----   Message from Marylen Ponto, NP sent at 02/11/2020 11:24 AM EST ----- Hello - pt is anemic on 28wk labs. Please call to make her aware and send iron/nutrition consult per protocol. Thank you, Joni Reining

## 2020-02-19 ENCOUNTER — Encounter: Payer: Self-pay | Admitting: Obstetrics

## 2020-02-19 ENCOUNTER — Telehealth (INDEPENDENT_AMBULATORY_CARE_PROVIDER_SITE_OTHER): Payer: Medicaid Other | Admitting: Obstetrics

## 2020-02-19 DIAGNOSIS — Z3480 Encounter for supervision of other normal pregnancy, unspecified trimester: Secondary | ICD-10-CM

## 2020-02-19 DIAGNOSIS — O09899 Supervision of other high risk pregnancies, unspecified trimester: Secondary | ICD-10-CM

## 2020-02-19 DIAGNOSIS — Z141 Cystic fibrosis carrier: Secondary | ICD-10-CM

## 2020-02-19 DIAGNOSIS — Z3A28 28 weeks gestation of pregnancy: Secondary | ICD-10-CM

## 2020-02-19 DIAGNOSIS — O99210 Obesity complicating pregnancy, unspecified trimester: Secondary | ICD-10-CM

## 2020-02-19 DIAGNOSIS — O99213 Obesity complicating pregnancy, third trimester: Secondary | ICD-10-CM

## 2020-02-19 DIAGNOSIS — O99013 Anemia complicating pregnancy, third trimester: Secondary | ICD-10-CM

## 2020-02-19 NOTE — Progress Notes (Signed)
TELEHEALTH OBSTETRICS PRENATAL VIRTUAL VIDEO VISIT ENCOUNTER NOTE  Provider location: [facility] for [facility] [place]   I connected with Kim Mejia on 02/19/20 at  2:00 PM EST by OB MyChart Video Encounter at home and verified that I am speaking with the correct person using two identifiers.   I discussed the limitations, risks, security and privacy concerns of performing an evaluation and management service virtually and the availability of in person appointments. I also discussed with the patient that there may be a patient responsible charge related to this service. The patient expressed understanding and agreed to proceed. Subjective:  Kim Mejia is a 19 y.o. [name] at [redacted]w[redacted]d being seen today for ongoing prenatal care.  She is currently monitored for the following issues for this low-risk pregnancy and has Pain of left calf; BMI (body mass index), pediatric, 95-99% for age; Language barrier; Claudication of calf muscles left; Closed fracture of distal fibula; Supervision of other normal pregnancy, antepartum; UTI in pregnancy, antepartum, first trimester; Cystic fibrosis carrier, antepartum; Obesity in pregnancy; and Anemia in pregnancy on their problem list.  Patient reports no complaints.  Contractions: Not present. Vag. Bleeding: None.  Movement: Present. Denies any leaking of fluid.   The following portions of the patient's history were reviewed and updated as appropriate: allergies, current medications, past family history, past medical history, past social history, past surgical history and problem list.   Objective:  There were no vitals filed for this visit.  Fetal Status:     Movement: Present     General:  Alert, oriented and cooperative. Patient is in no acute distress.  Respiratory: Normal respiratory effort, no problems with respiration noted  Mental Status: Normal mood and affect. Normal behavior. Normal judgment and thought content.  Rest of physical exam  deferred due to type of encounter  Imaging: [place] MFM OB FOLLOW UP  Result Date: 01/29/2020 ----------------------------------------------------------------------  OBSTETRICS REPORT                       (Signed Final 01/29/2020 12:09 pm) ---------------------------------------------------------------------- Patient Info  ID #:       [id number]                          D.O.B.:  2001-09-22 (19 yrs)  Name:       Kim Mejia                  Visit Date: 01/29/2020 11:27 am ---------------------------------------------------------------------- Performed By  Performed By:     Sandi Mealy        Ref. Address:     [address]  [address]                                                             [place]                                                             [id number]  Attending:        Ma Rings MD         [facility]  Referred By:      [facility] ---------------------------------------------------------------------- Orders   #  Description                          Code         Ordered By   1  [place] MFM OB FOLLOW UP                  79390.30     Lin Landsman  ----------------------------------------------------------------------   #  Order #                    Accession #                 Episode #   1  [id number]                  [id number]                  [id number]  ---------------------------------------------------------------------- Indications   Obesity complicating pregnancy, second         O99.212   trimester BMI 40   Encounter for other antenatal screening        Z36.2   follow-up   Cystic Fibrosis (CF) Carrier, second trimester O09.892   [redacted] weeks gestation of pregnancy                Z3A.25   ---------------------------------------------------------------------- Fetal Evaluation  Num Of Fetuses:         1  Fetal Heart Rate(bpm):  136  Cardiac Activity:       Observed  Presentation:           Cephalic  Placenta:               Posterior  P. Cord Insertion:      Previously Visualized  Amniotic Fluid  AFI  FV:      Within normal limits                              Largest Pocket(cm)                              7.26 ---------------------------------------------------------------------- Biometry  BPD:      68.3  mm     G. Age:  27w 3d         93  %    CI:        76.94   %    70 - 86                                                          FL/HC:      19.1   %    18.6 - 20.4  HC:      246.6  mm     G. Age:  26w 5d         69  %    HC/AC:      1.14        1.04 - 1.22  AC:       217   mm     G. Age:  26w 1d         60  %    FL/BPD:     69.1   %    71 - 87  FL:       47.2  mm     G. Age:  25w 5d         41  %    FL/AC:      21.8   %    20 - 24  HUM:      42.8  mm     G. Age:  25w 4d         46  %  LV:          3  mm  Est. FW:     899  gm           2 lb     65  % ---------------------------------------------------------------------- OB History  Gravidity:    1 ---------------------------------------------------------------------- Gestational Age  Clinical EDD:  25w 4d                                        EDD:   05/09/20  U/S Today:     26w 4d                                        EDD:   05/02/20  Best:          25w 4d     Det. By:  Clinical EDD             EDD:   05/09/20 ---------------------------------------------------------------------- Anatomy  Cranium:               Appears normal         LVOT:  Previously seen  Cavum:                 Appears normal         Aortic Arch:            Previously seen  Ventricles:            Appears normal         Ductal Arch:            Previously seen  Choroid Plexus:        Previously seen        Diaphragm:              Appears normal  Cerebellum:             Previously seen        Stomach:                Appears normal, left                                                                        sided  Posterior Fossa:       Previously seen        Abdomen:                Appears normal  Nuchal Fold:           Not applicable (>20    Abdominal Wall:         Previously seen                         wks GA)  Face:                  Orbits and profile     Cord Vessels:           Previously seen                         previously seen  Lips:                  Appears normal         Kidneys:                Appear normal  Palate:                Previously seen        Bladder:                Appears normal  Thoracic:              Appears normal         Spine:                  Previously seen  Heart:                 Previously seen        Upper Extremities:      Previously seen  RVOT:                  Previously seen        Lower Extremities:      Previously seen ----------------------------------------------------------------------  Comments  This patient was seen for a follow up growth scan due to  maternal obesity.  The fetal growth and amniotic fluid level appears appropriate  for her gestational age.  A follow up exam was scheduled in 6 weeks. ----------------------------------------------------------------------                   Johnell Comings, MD Electronically Signed Final Report   01/29/2020 12:09 pm ----------------------------------------------------------------------   Assessment and Plan:  Pregnancy: [name] at [redacted]w[redacted]d 1. Supervision of other normal pregnancy, antepartum  2. Cystic fibrosis carrier, antepartum  3. Anemia during pregnancy in third trimester - taking iron  4. Obesity in pregnancy   Preterm labor symptoms and general obstetric precautions including but not limited to vaginal bleeding, contractions, leaking of fluid and fetal movement were reviewed in detail with the patient. I discussed the assessment and treatment plan with the patient. The  patient was provided an opportunity to ask questions and all were answered. The patient agreed with the plan and demonstrated an understanding of the instructions. The patient was advised to call back or seek an in-person office evaluation/go to [facility] at [facility] for any urgent or concerning symptoms. Please refer to After Visit Summary for other counseling recommendations.   I provided 10 minutes of face-to-face time during this encounter.  Return in about 2 weeks (around 03/04/2020) for MyChart.  Future Appointments  Date Time Provider [place]  02/19/2020  2:00 PM Shelly Bombard, MD [place] None  03/18/2020  9:45 AM [place] NURSE [place] [facility]-US  03/18/2020  9:45 AM [place] 2 [facility]-M[facility]US MFC-US    Baltazar Najjar, [place] [facility], [facility] 02/19/2020

## 2020-02-19 NOTE — Progress Notes (Signed)
S/w pt for virtual visit, pt reports fetal movement, denies pain. Pt states that her BP cuff malfunctioned and she has to take it back to the pharmacy.

## 2020-03-04 ENCOUNTER — Encounter: Payer: Self-pay | Admitting: Obstetrics

## 2020-03-04 ENCOUNTER — Telehealth (INDEPENDENT_AMBULATORY_CARE_PROVIDER_SITE_OTHER): Payer: Medicaid Other | Admitting: Obstetrics

## 2020-03-04 DIAGNOSIS — O09899 Supervision of other high risk pregnancies, unspecified trimester: Secondary | ICD-10-CM

## 2020-03-04 DIAGNOSIS — O99013 Anemia complicating pregnancy, third trimester: Secondary | ICD-10-CM

## 2020-03-04 DIAGNOSIS — Z141 Cystic fibrosis carrier: Secondary | ICD-10-CM

## 2020-03-04 DIAGNOSIS — O99213 Obesity complicating pregnancy, third trimester: Secondary | ICD-10-CM

## 2020-03-04 DIAGNOSIS — Z3A30 30 weeks gestation of pregnancy: Secondary | ICD-10-CM

## 2020-03-04 DIAGNOSIS — O99210 Obesity complicating pregnancy, unspecified trimester: Secondary | ICD-10-CM

## 2020-03-04 DIAGNOSIS — Z3480 Encounter for supervision of other normal pregnancy, unspecified trimester: Secondary | ICD-10-CM

## 2020-03-04 DIAGNOSIS — D649 Anemia, unspecified: Secondary | ICD-10-CM

## 2020-03-04 NOTE — Progress Notes (Signed)
Pt does not have BP cuff available today.  

## 2020-03-04 NOTE — Progress Notes (Signed)
   TELEHEALTH OBSTETRICS PRENATAL VIRTUAL VIDEO VISIT ENCOUNTER NOTE  Provider location: [facility] for [facility] at [place]   I connected with [name] on 03/04/20 at  2:15 PM EST by [facility] MyChart Video Encounter at home and verified that I am speaking with the correct person using two identifiers.   I discussed the limitations, risks, security and privacy concerns of performing an evaluation and management service virtually and the availability of in person appointments. I also discussed with the patient that there may be a patient responsible charge related to this service. The patient expressed understanding and agreed to proceed. Subjective:  Kim Mejia is a 19 y.o. [name] at [redacted]w[redacted]d being seen today for ongoing prenatal care.  She is currently monitored for the following issues for this low-risk pregnancy and has Pain of left calf; BMI (body mass index), pediatric, 95-99% for age; Language barrier; Claudication of calf muscles left; Closed fracture of distal fibula; Supervision of other normal pregnancy, antepartum; UTI in pregnancy, antepartum, first trimester; Cystic fibrosis carrier, antepartum; Obesity in pregnancy; and Anemia in pregnancy on their problem list.  Patient reports no complaints.  Contractions: Not present. Vag. Bleeding: None.  Movement: Present. Denies any leaking of fluid.   The following portions of the patient's history were reviewed and updated as appropriate: allergies, current medications, past family history, past medical history, past social history, past surgical history and problem list.   Objective:  There were no vitals filed for this visit.  Fetal Status:     Movement: Present     General:  Alert, oriented and cooperative. Patient is in no acute distress.  Respiratory: Normal respiratory effort, no problems with respiration noted  Mental Status: Normal mood and affect. Normal behavior. Normal judgment and thought content.  Rest of physical exam  deferred due to type of encounter  Imaging: No results found.  Assessment and Plan:  Pregnancy: [name] at [redacted]w[redacted]d 1. Supervision of other normal pregnancy, antepartum  2. Cystic fibrosis carrier, antepartum  3. Anemia during pregnancy in third trimester - taking iron  4. Obesity in pregnancy   Preterm labor symptoms and general obstetric precautions including but not limited to vaginal bleeding, contractions, leaking of fluid and fetal movement were reviewed in detail with the patient. I discussed the assessment and treatment plan with the patient. The patient was provided an opportunity to ask questions and all were answered. The patient agreed with the plan and demonstrated an understanding of the instructions. The patient was advised to call back or seek an in-person office evaluation/go to MAU at [facility] for any urgent or concerning symptoms. Please refer to After Visit Summary for other counseling recommendations.   I provided 10 minutes of face-to-face time during this encounter.  No follow-ups on file.  Future Appointments  Date Time Provider Department Center  03/04/2020  2:15 PM Brock Bad, MD [facility] None  03/18/2020  9:45 AM [facility] NURSE [facility] M[facility]-US  03/18/2020  9:45 AM [facility] [place] 2 [facility]US M[facility]-US    Coral Ceo, MD Center for [facility], [facility] 03/04/2020

## 2020-03-18 ENCOUNTER — Ambulatory Visit (HOSPITAL_COMMUNITY): Payer: Medicaid Other | Admitting: *Deleted

## 2020-03-18 ENCOUNTER — Ambulatory Visit (HOSPITAL_COMMUNITY)
Admission: RE | Admit: 2020-03-18 | Discharge: 2020-03-18 | Disposition: A | Discharge status: Home / Self Care | Payer: Medicaid Other | Source: Ambulatory Visit | Attending: Obstetrics and Gynecology | Admitting: Obstetrics and Gynecology

## 2020-03-18 ENCOUNTER — Telehealth (INDEPENDENT_AMBULATORY_CARE_PROVIDER_SITE_OTHER): Payer: Medicaid Other | Admitting: Obstetrics and Gynecology

## 2020-03-18 ENCOUNTER — Other Ambulatory Visit: Payer: Self-pay

## 2020-03-18 ENCOUNTER — Encounter (HOSPITAL_COMMUNITY): Payer: Self-pay

## 2020-03-18 ENCOUNTER — Encounter: Payer: Self-pay | Admitting: Obstetrics and Gynecology

## 2020-03-18 DIAGNOSIS — O09899 Supervision of other high risk pregnancies, unspecified trimester: Secondary | ICD-10-CM

## 2020-03-18 DIAGNOSIS — Z6841 Body Mass Index (BMI) 40.0 and over, adult: Secondary | ICD-10-CM

## 2020-03-18 DIAGNOSIS — Z3480 Encounter for supervision of other normal pregnancy, unspecified trimester: Secondary | ICD-10-CM

## 2020-03-18 DIAGNOSIS — D649 Anemia, unspecified: Secondary | ICD-10-CM | POA: Diagnosis not present

## 2020-03-18 DIAGNOSIS — Z141 Cystic fibrosis carrier: Secondary | ICD-10-CM | POA: Insufficient documentation

## 2020-03-18 DIAGNOSIS — Z3A32 32 weeks gestation of pregnancy: Secondary | ICD-10-CM

## 2020-03-18 DIAGNOSIS — O09893 Supervision of other high risk pregnancies, third trimester: Secondary | ICD-10-CM | POA: Diagnosis not present

## 2020-03-18 DIAGNOSIS — O99013 Anemia complicating pregnancy, third trimester: Secondary | ICD-10-CM | POA: Diagnosis not present

## 2020-03-18 DIAGNOSIS — O99213 Obesity complicating pregnancy, third trimester: Secondary | ICD-10-CM | POA: Diagnosis not present

## 2020-03-18 DIAGNOSIS — Z362 Encounter for other antenatal screening follow-up: Secondary | ICD-10-CM | POA: Diagnosis not present

## 2020-03-18 DIAGNOSIS — O99019 Anemia complicating pregnancy, unspecified trimester: Secondary | ICD-10-CM

## 2020-03-18 DIAGNOSIS — O99210 Obesity complicating pregnancy, unspecified trimester: Secondary | ICD-10-CM

## 2020-03-18 NOTE — Progress Notes (Signed)
OBSTETRICS PRENATAL VIRTUAL VISIT ENCOUNTER NOTE  Provider location: [facility] [place]   I connected with Kim Mejia on 03/18/20 at  3:45 PM EDT by MyChart Video Encounter at home and verified that I am speaking with the correct person using two identifiers.   I discussed the limitations, risks, security and privacy concerns of performing an evaluation and management service virtually and the availability of in person appointments. I also discussed with the patient that there may be a patient responsible charge related to this service. The patient expressed understanding and agreed to proceed. Subjective:  Kim Mejia is a 19 y.o. [name] at [redacted]w[redacted]d being seen today for ongoing prenatal care.  She is currently monitored for the following issues for this low-risk pregnancy and has Pain of left calf; BMI (body mass index), pediatric, 95-99% for age; Language barrier; Supervision of other normal pregnancy, antepartum; UTI in pregnancy, antepartum, first trimester; Cystic fibrosis carrier, antepartum; Obesity in pregnancy; and Anemia in pregnancy on their problem list.  Patient reports no complaints.  Contractions: Not present. Vag. Bleeding: None.  Movement: Present. Denies any leaking of fluid.   The following portions of the patient's history were reviewed and updated as appropriate: allergies, current medications, past family history, past medical history, past social history, past surgical history and problem list.   Objective:  There were no vitals filed for this visit.  Fetal Status:     Movement: Present     General:  Alert, oriented and cooperative. Patient is in no acute distress.  Respiratory: Normal respiratory effort, no problems with respiration noted  Mental Status: Normal mood and affect. Normal behavior. Normal judgment and thought content.  Rest of physical exam deferred due to type of encounter  Imaging: [place] MFM OB FOLLOW UP  Result Date:  03/18/2020 ----------------------------------------------------------------------  OBSTETRICS REPORT                       (Signed Final 03/18/2020 10:53 am) ---------------------------------------------------------------------- Patient Info  ID #:       [id number]                          D.O.B.:  Jan 20, 2001 (18 yrs)  Name:       Kim Mejia                  Visit Date: 03/18/2020 09:57 am ---------------------------------------------------------------------- Performed By  Performed By:     Jeanene Erb BS,      Ref. Address:     [address]  [place]                                                             [address]  Attending:        Ma Rings MD         [facility]  Referred By:      [facility] ---------------------------------------------------------------------- Orders   #  Description                          Code         Ordered By   1  [place] MFM OB FOLLOW UP                  [phone]     Rosana Hoes  ----------------------------------------------------------------------   #  Order #                    Accession #                 Episode #   1  [id number]                  [id number]                  [id number]  ---------------------------------------------------------------------- Indications   Obesity complicating pregnancy, third          O99.213   trimester   Cystic Fibrosis (CF) Carrier, third trimester  O09.893   Encounter for other antenatal screening        Z36.2   follow-up   [redacted] weeks gestation of pregnancy                Z3A.32   Anemia during pregnancy in third trimester     O99.013  ---------------------------------------------------------------------- Vital Signs  Weight (lb): 240                                Height:        5'3"  BMI:         42.51 ---------------------------------------------------------------------- Fetal Evaluation  Num Of Fetuses:         1  Fetal Heart Rate(bpm):  146  Cardiac Activity:       Observed  Presentation:           Cephalic  Placenta:               Posterior  P. Cord Insertion:      Previously Visualized  Amniotic Fluid  AFI FV:      Within normal limits  AFI Sum(cm)     %Tile       Largest Pocket(cm)  18.32           68  6.61  RUQ(cm)       RLQ(cm)       LUQ(cm)        LLQ(cm)  5.88          1.72          4.11           6.61 ---------------------------------------------------------------------- Biometry  BPD:      89.6  mm     G. Age:  36w 2d       > 99  %    CI:        80.65   %    70 - 86                                                          FL/HC:      19.2   %    19.9 - 21.5  HC:      315.1  mm     G. Age:  35w 3d         84  %    HC/AC:      1.03        0.96 - 1.11  AC:      305.6  mm     G. Age:  34w 4d         93  %    FL/BPD:     67.6   %    71 - 87  FL:       60.6  mm     G. Age:  31w 4d         14  %    FL/AC:      19.8   %    20 - 24  Est. FW:    2310  gm      5 lb 1 oz     82  % ---------------------------------------------------------------------- OB History  Gravidity:    1 ---------------------------------------------------------------------- Gestational Age  Clinical EDD:  32w 4d                                        EDD:   05/09/20  U/S Today:     34w 3d                                        EDD:   04/26/20  Best:          32w 4d     Det. By:  Clinical EDD             EDD:   05/09/20 ---------------------------------------------------------------------- Anatomy  Cranium:               Appears normal         LVOT:                   Previously seen  Cavum:                 Previously seen        Aortic Arch:            Previously seen  Ventricles:  Appears normal         Ductal Arch:            Previously seen  Choroid Plexus:         Previously seen        Diaphragm:              Previously seen  Cerebellum:            Previously seen        Stomach:                Appears normal, left                                                                        sided  Posterior Fossa:       Previously seen        Abdomen:                Appears normal  Nuchal Fold:           Not applicable (>20    Abdominal Wall:         Previously seen                         wks GA)  Face:                  Orbits and profile     Cord Vessels:           Previously seen                         previously seen  Lips:                  Previously seen        Kidneys:                Appear normal  Palate:                Previously seen        Bladder:                Appears normal  Thoracic:              Appears normal         Spine:                  Previously seen  Heart:                 Previously seen        Upper Extremities:      Previously seen  RVOT:                  Previously seen        Lower Extremities:      Previously seen  Other:  Technicallly difficult due to advanced GA and maternal habitus. ---------------------------------------------------------------------- Cervix Uterus Adnexa  Cervix  Not visualized (advanced GA >24wks) ---------------------------------------------------------------------- Comments  This patient was seen for a follow up growth scan due to due  to maternal obesity.  She denies any problems since her last  exam and reports that she has  screened negative for  gestational diabetes.  She was informed that the fetal growth and amniotic fluid  level appears appropriate for her gestational age.  As the fetal growth is within normal limits, no further exams  were scheduled in our office. ----------------------------------------------------------------------                   Ma Rings, MD Electronically Signed Final Report   03/18/2020 10:53 am ----------------------------------------------------------------------   Assessment and  Plan:  Pregnancy: [name] at [redacted]w[redacted]d 1. Antepartum anemia On iron supplement  2. Obesity in pregnancy Growth scan today  82 %, no further scans needed  3. Cystic fibrosis carrier, antepartum   4. Supervision of other normal pregnancy, antepartum Stable   Preterm labor symptoms and general obstetric precautions including but not limited to vaginal bleeding, contractions, leaking of fluid and fetal movement were reviewed in detail with the patient. I discussed the assessment and treatment plan with the patient. The patient was provided an opportunity to ask questions and all were answered. The patient agreed with the plan and demonstrated an understanding of the instructions. The patient was advised to call back or seek an in-person office evaluation/go to [facility] at [facility] for any urgent or concerning symptoms. Please refer to After Visit Summary for other counseling recommendations.   I provided 8 minutes of face-to-face time during this encounter.  Return in about 2 weeks (around 04/01/2020) for OB visit, virtual, any provider.  Future Appointments  Date Time Provider Department Center  03/18/2020  3:45 PM Hermina Staggers, MD [facility] None    Hermina Staggers, MD [facility], [facility]

## 2020-03-18 NOTE — Progress Notes (Signed)
Pt is on the phone preparing for virtual visit with provider. [redacted]w[redacted]d. Pt reports that her BP cuff is not working, I advised pt to take old cuff to [facility] pharmacy so they can exchange it for her. Pt voices understanding. Pt denies any HA's or blurry vision.

## 2020-03-26 ENCOUNTER — Inpatient Hospital Stay (HOSPITAL_COMMUNITY)
Admission: AD | Admit: 2020-03-26 | Discharge: 2020-03-26 | Disposition: A | Discharge status: Home / Self Care | Payer: Medicaid Other | Attending: Family Medicine | Admitting: Family Medicine

## 2020-03-26 ENCOUNTER — Other Ambulatory Visit: Payer: Self-pay

## 2020-03-26 ENCOUNTER — Encounter (HOSPITAL_COMMUNITY): Payer: Self-pay | Admitting: Family Medicine

## 2020-03-26 DIAGNOSIS — Z7982 Long term (current) use of aspirin: Secondary | ICD-10-CM | POA: Diagnosis not present

## 2020-03-26 DIAGNOSIS — R109 Unspecified abdominal pain: Secondary | ICD-10-CM | POA: Diagnosis not present

## 2020-03-26 DIAGNOSIS — Z3A33 33 weeks gestation of pregnancy: Secondary | ICD-10-CM | POA: Insufficient documentation

## 2020-03-26 DIAGNOSIS — O4693 Antepartum hemorrhage, unspecified, third trimester: Secondary | ICD-10-CM | POA: Diagnosis not present

## 2020-03-26 DIAGNOSIS — Z3689 Encounter for other specified antenatal screening: Secondary | ICD-10-CM

## 2020-03-26 DIAGNOSIS — R58 Hemorrhage, not elsewhere classified: Secondary | ICD-10-CM

## 2020-03-26 LAB — URINALYSIS, ROUTINE W REFLEX MICROSCOPIC
Bilirubin Urine: NEGATIVE
Glucose, UA: NEGATIVE mg/dL
Hgb urine dipstick: NEGATIVE
Ketones, ur: NEGATIVE mg/dL
Leukocytes,Ua: NEGATIVE
Nitrite: NEGATIVE
Protein, ur: NEGATIVE mg/dL
Specific Gravity, Urine: 1.027 (ref 1.005–1.030)
pH: 6 (ref 5.0–8.0)

## 2020-03-26 LAB — WET PREP, GENITAL
Clue Cells Wet Prep HPF POC: NONE SEEN
Sperm: NONE SEEN
Trich, Wet Prep: NONE SEEN
Yeast Wet Prep HPF POC: NONE SEEN

## 2020-03-26 NOTE — MAU Note (Signed)
PT SAYS SHE WENT TO B-ROOM  AT 0600- WHEN WIPED - SHE HAD PINK SPOTTING . LAST SEX-  2 MTHS AGO. PNC WITH  [facility]. Marland Kitchen  FEELS SOME PAIN - 3

## 2020-03-26 NOTE — Discharge Instructions (Signed)
Warning Signs During Pregnancy A pregnancy lasts about 40 weeks, starting from the first day of your last period until the baby is born. Pregnancy is divided into three phases called trimesters.  The first trimester refers to week 1 through week 13 of pregnancy.  The second trimester is the start of week 14 through the end of week 27.  The third trimester is the start of week 28 until you deliver your baby. During each trimester of pregnancy, certain signs and symptoms may indicate a problem. Talk with your health care provider about your current health and any medical conditions you have. Make sure you know the symptoms that you should watch for and report. How does this affect me?  Warning signs in the first trimester While some changes during the first trimester may be uncomfortable, most do not represent a serious problem. Let your health care provider know if you have any of the following warning signs in the first trimester:  You cannot eat or drink without vomiting, and this lasts for longer than a day.  You have vaginal bleeding or spotting along with menstrual-like cramping.  You have diarrhea for longer than a day.  You have a fever or other signs of infection, such as: ? Pain or burning when you urinate. ? Foul smelling or thick or yellowish vaginal discharge. Warning signs in the second trimester As your baby grows and changes during the second trimester, there are additional signs and symptoms that may indicate a problem. These include:  Signs and symptoms of infection, including a fever.  Signs or symptoms of a miscarriage or preterm labor, such as regular contractions, menstrual-like cramping, or lower abdominal pain.  Bloody or watery vaginal discharge or obvious vaginal bleeding.  Feeling like your heart is pounding.  Having trouble breathing.  Nausea, vomiting, or diarrhea that lasts for longer than a day.  Craving non-food items, such as clay, chalk, or dirt.  This may be a sign of a very treatable medical condition called pica. Later in your second trimester, watch for signs and symptoms of a serious medical condition called preeclampsia.These include:  Changes in your vision.  A severe headache that does not go away.  Nausea and vomiting. It is also important to notice if your baby stops moving or moves less than usual during this time. Warning signs in the third trimester As you approach the third trimester, your baby is growing and your body is preparing for the birth of your baby. In your third trimester, be sure to let your health care provider know if:  You have signs and symptoms of infection, including a fever.  You have vaginal bleeding.  You notice that your baby is moving less than usual or is not moving.  You have nausea, vomiting, or diarrhea that lasts for longer than a day.  You have a severe headache that does not go away.  You have vision changes, including seeing spots or having blurry or double vision.  You have increased swelling in your hands or face. How does this affect my baby? Throughout your pregnancy, always report any of the warning signs of a problem to your health care provider. This can help prevent complications that may affect your baby, including:  Increased risk for premature birth.  Infection that may be transmitted to your baby.  Increased risk for stillbirth. Contact a health care provider if:  You have any of the warning signs of a problem for the current trimester of your pregnancy.    Any of the following apply to you during any trimester of pregnancy: ? You have strong emotions, such as sadness or anxiety, that interfere with work or personal relationships. ? You feel unsafe in your home and need help finding a safe place to live. ? You are using tobacco products, alcohol, or drugs and you need help to stop. Get help right away if: You have signs or symptoms of labor before 37 weeks of  pregnancy. These include:  Contractions that are 5 minutes or less apart, or that increase in frequency, intensity, or length.  Sudden, sharp abdominal pain or low back pain.  Uncontrolled gush or trickle of fluid from your vagina. Summary  A pregnancy lasts about 40 weeks, starting from the first day of your last period until the baby is born. Pregnancy is divided into three phases called trimesters. Each trimester has warning signs to watch for.  Always report any warning signs to your health care provider in order to prevent complications that may affect both you and your baby.  Talk with your health care provider about your current health and any medical conditions you have. Make sure you know the symptoms that you should watch for and report. This information is not intended to replace advice given to you by your health care provider. Make sure you discuss any questions you have with your health care provider. Document Revised: 04/03/2019 Document Reviewed: 09/29/2017 Elsevier Patient Education  2020 [employer] Fetal Movement Counts Patient Name: ________________________________________________ Patient Due Date: ____________________ What is a fetal movement count?  A fetal movement count is the number of times that you feel your baby move during a certain amount of time. This may also be called a fetal kick count. A fetal movement count is recommended for every pregnant woman. You may be asked to start counting fetal movements as early as week 28 of your pregnancy. Pay attention to when your baby is most active. You may notice your baby's sleep and wake cycles. You may also notice things that make your baby move more. You should do a fetal movement count:  When your baby is normally most active.  At the same time each day. A good time to count movements is while you are resting, after having something to eat and drink. How do I count fetal movements? 1. Find a quiet, comfortable  area. Sit, or lie down on your side. 2. Write down the date, the start time and stop time, and the number of movements that you felt between those two times. Take this information with you to your health care visits. 3. Write down your start time when you feel the first movement. 4. Count kicks, flutters, swishes, rolls, and jabs. You should feel at least 10 movements. 5. You may stop counting after you have felt 10 movements, or if you have been counting for 2 hours. Write down the stop time. 6. If you do not feel 10 movements in 2 hours, contact your health care provider for further instructions. Your health care provider may want to do additional tests to assess your baby's well-being. Contact a health care provider if:  You feel fewer than 10 movements in 2 hours.  Your baby is not moving like he or she usually does. Date: ____________ Start time: ____________ Stop time: ____________ Movements: ____________ Date: ____________ Start time: ____________ Stop time: ____________ Movements: ____________ Date: ____________ Start time: ____________ Stop time: ____________ Movements: ____________ Date: ____________ Start time: ____________ Stop time: ____________ Movements: ____________  Date: ____________ Start time: ____________ Stop time: ____________ Movements: ____________ Date: ____________ Start time: ____________ Stop time: ____________ Movements: ____________ Date: ____________ Start time: ____________ Stop time: ____________ Movements: ____________ Date: ____________ Start time: ____________ Stop time: ____________ Movements: ____________ Date: ____________ Start time: ____________ Stop time: ____________ Movements: ____________ This information is not intended to replace advice given to you by your health care provider. Make sure you discuss any questions you have with your health care provider. Document Revised: 08/02/2019 Document Reviewed: 08/02/2019 Elsevier Patient Education  2020  [employer].

## 2020-03-26 NOTE — MAU Provider Note (Addendum)
Chief Complaint:  Abdominal Pain   First Provider Initiated Contact with Patient 03/26/20 [phone]     HPI: Kim Mejia is a 19 y.o. [name] at 74w5dwho presents to maternity admissions reporting pink discharge this morning.  No further bleeding afterward.  Did have a BM this am.  No cramping. . She reports good fetal movement, denies LOF, vaginal itching/burning, urinary symptoms, h/a, dizziness, n/v, diarrhea, constipation or fever/chills.    Vaginal Bleeding The patient's primary symptoms include pelvic pain (very slight per report to me), vaginal bleeding (pink x 1, none further) and vaginal discharge. The patient's pertinent negatives include no genital itching, genital lesions or genital odor. This is a new problem. The current episode started today. The problem occurs intermittently. The problem has been resolved. The pain is mild. She is pregnant. Pertinent negatives include no back pain, chills, constipation, diarrhea, fever, frequency, headaches, nausea or vomiting. The vaginal discharge was bloody (Pink). The vaginal bleeding is spotting. She has not been passing clots. She has not been passing tissue. Nothing aggravates the symptoms. She has tried nothing for the symptoms. She is not sexually active.    RN Note: PT SAYS SHE WENT TO B-ROOM  AT 0600- WHEN WIPED - SHE HAD PINK SPOTTING . LAST SEX-  2 MTHS AGO. [place] WITH  [place]. Marland Kitchen  FEELS SOME PAIN - 3   Past Medical History: Past Medical History:  Diagnosis Date  . 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 Closed fracture of distal fibula 03/10/2017   Right - broken when kicked during soccer game  . Obesity     Past obstetric history: OB History  Gravida Para Term Preterm AB Living  1 0          SAB TAB Ectopic Multiple Live Births               # Outcome Date GA Lbr Len/2nd Weight Sex  Delivery Anes PTL Lv  1 Current             Past Surgical History: Past Surgical History:  Procedure Laterality Date  . ORIF FIBULA FRACTURE Right 03/14/2017   Procedure: RIGHT OPEN REDUCTION INTERNAL FIXATION LATERAL MALLEOLUS;  Surgeon: Marybelle Killings, MD;  Location: [facility];  Service: Orthopedics;  Laterality: Right;    Family History: Family History  Problem Relation Age of Onset  . Hyperlipidemia Mother     Social History: Social History   Tobacco Use  . Smoking status: Never Smoker  . Smokeless tobacco: Never Used  . Tobacco comment: father smokes, visits on weekends  Substance Use Topics  . Alcohol use: No    Alcohol/week: 0.0 standard drinks  . Drug use: No    Allergies:  Allergies  Allergen Reactions  . No Known Allergies     Meds:  Medications Prior to Admission  Medication Sig Dispense Refill Last Dose  . aspirin EC 81 MG tablet Take 1 tablet (81 mg total) by mouth daily. 30 tablet 4 03/25/2020 at Unknown time  . Blood Pressure Monitoring (BLOOD PRESSURE CUFF) MISC 1 Device by Does not apply route once a week. 1 each 0 Past Month at Unknown time  . ferrous sulfate (FERROUSUL) 325 (65 FE) MG tablet Take 1 tablet (325 mg total) by mouth 2 (two) times daily. 60 tablet 1 03/25/2020 at Unknown time  . Prenatal Vit-Fe  Fumarate-FA (MULTIVITAMIN-PRENATAL) 27-0.8 MG TABS tablet Take 1 tablet by mouth daily at 12 noon.   Past Week at Unknown time  . Doxylamine-Pyridoxine (DICLEGIS) 10-10 MG TBEC Take 2 tablets at bedtime and one in the morning and one in the afternoon as needed for nausea. (Patient not taking: Reported on 02/05/2020) 60 tablet 2 More than a month at Unknown time  . Elastic Bandages & Supports (COMFORT FIT MATERNITY SUPP MED) MISC 1 Device by Does not apply route daily. (Patient not taking: Reported on 01/22/2020) 1 each 0     I have reviewed patient's Past Medical Hx, Surgical Hx, Family Hx, Social Hx, medications and allergies.   ROS:  Review of Systems   Constitutional: Negative for chills and fever.  Gastrointestinal: Negative for constipation, diarrhea, nausea and vomiting.  Genitourinary: Positive for pelvic pain (very slight per report to me), vaginal bleeding and vaginal discharge. Negative for frequency.  Musculoskeletal: Negative for back pain.  Neurological: Negative for headaches.   Other systems negative  Physical Exam   Patient Vitals for the past 24 hrs:  BP Temp Pulse Resp Height Weight  03/26/20 0658 106/65 98.3 F (36.8 C) 75 20 5' 2.5" (1.588 m) 113.7 kg   Constitutional: Well-developed, well-nourished female in no acute distress.  Cardiovascular: normal rate and rhythm Respiratory: normal effort, clear to auscultation bilaterally GI: Abd soft, non-tender, gravid appropriate for gestational age.   No rebound or guarding. MS: Extremities nontender, no edema, normal ROM Neurologic: Alert and oriented x 4.  GU: Neg CVAT.  PELVIC EXAM: Cervix pink, visually closed, without lesion, scant yellow creamy discharge, vaginal walls and external genitalia normal   No pink or red discharge seen visually or on swabs  Dilation: Closed Effacement (%): Thick Cervical Position: Posterior Presentation: Vertex Exam by:: Hilda Lias, Midwife Dilation: Closed Effacement (%): Thick Cervical Position: Posterior Presentation: Vertex Exam by:: Hilda Lias, Midwife  FHT:  Baseline 140 , moderate variability, accelerations present, no decelerations Contractions:   Rare   Labs:  O/Positive/-- (10/16 1114)  Results for orders placed or performed during the hospital encounter of 03/26/20 (from the past 24 hour(s))  Wet prep, genital     Status: Abnormal   Collection Time: 03/26/20  7:46 AM   Specimen: Vaginal  Result Value Ref Range   Yeast Wet Prep HPF POC NONE SEEN NONE SEEN   Trich, Wet Prep NONE SEEN NONE SEEN   Clue Cells Wet Prep HPF POC NONE SEEN NONE SEEN   WBC, Wet Prep HPF POC MANY (A) NONE SEEN   Sperm NONE SEEN   Urinalysis,  Routine w reflex microscopic     Status: None   Collection Time: 03/26/20  7:47 AM  Result Value Ref Range   Color, Urine YELLOW YELLOW   APPearance CLEAR CLEAR   Specific Gravity, Urine 1.027 1.005 - 1.030   pH 6.0 5.0 - 8.0   Glucose, UA NEGATIVE NEGATIVE mg/dL   Hgb urine dipstick NEGATIVE NEGATIVE   Bilirubin Urine NEGATIVE NEGATIVE   Ketones, ur NEGATIVE NEGATIVE mg/dL   Protein, ur NEGATIVE NEGATIVE mg/dL   Nitrite NEGATIVE NEGATIVE   Leukocytes,Ua NEGATIVE NEGATIVE    Imaging:    MAU Course/MDM: I have ordered labs and reviewed results. Cultures and wet prep done to assess for cervical irritation. NST reviewed  Care turned over to oncoming provider  Wynelle Bourgeois CNM, MSN Certified Nurse-Midwife 03/26/2020 8:00 AM   Wet prep & u/a negative RH positive Exam normal   A:  1. Bleeding from  unknown site  -pt reports spotting but none on exam.   2. [redacted] weeks gestation of pregnancy   3. NST (non-stress test) reactive    P: Discharge home Bleeding precautions GC/CT pending F/u with ob/gyn  Judeth Horn, NP

## 2020-03-27 LAB — GC/CHLAMYDIA PROBE AMP (~~LOC~~) NOT AT ARMC
Chlamydia: NEGATIVE
Comment: NEGATIVE
Comment: NORMAL
Neisseria Gonorrhea: NEGATIVE

## 2020-04-01 ENCOUNTER — Telehealth (INDEPENDENT_AMBULATORY_CARE_PROVIDER_SITE_OTHER): Payer: Medicaid Other | Admitting: Obstetrics

## 2020-04-01 ENCOUNTER — Encounter: Payer: Self-pay | Admitting: Obstetrics

## 2020-04-01 DIAGNOSIS — O09899 Supervision of other high risk pregnancies, unspecified trimester: Secondary | ICD-10-CM

## 2020-04-01 DIAGNOSIS — O99019 Anemia complicating pregnancy, unspecified trimester: Secondary | ICD-10-CM

## 2020-04-01 DIAGNOSIS — Z141 Cystic fibrosis carrier: Secondary | ICD-10-CM

## 2020-04-01 DIAGNOSIS — Z3480 Encounter for supervision of other normal pregnancy, unspecified trimester: Secondary | ICD-10-CM

## 2020-04-01 DIAGNOSIS — O99013 Anemia complicating pregnancy, third trimester: Secondary | ICD-10-CM

## 2020-04-01 DIAGNOSIS — O99213 Obesity complicating pregnancy, third trimester: Secondary | ICD-10-CM

## 2020-04-01 DIAGNOSIS — O99513 Diseases of the respiratory system complicating pregnancy, third trimester: Secondary | ICD-10-CM

## 2020-04-01 DIAGNOSIS — Z3A34 34 weeks gestation of pregnancy: Secondary | ICD-10-CM

## 2020-04-01 DIAGNOSIS — E669 Obesity, unspecified: Secondary | ICD-10-CM

## 2020-04-01 DIAGNOSIS — D649 Anemia, unspecified: Secondary | ICD-10-CM

## 2020-04-01 DIAGNOSIS — O99210 Obesity complicating pregnancy, unspecified trimester: Secondary | ICD-10-CM

## 2020-04-01 NOTE — Progress Notes (Signed)
OBSTETRICS PRENATAL VIRTUAL VISIT ENCOUNTER NOTE  Provider location: [facility] [place]   I connected with Kim Mejia on 04/01/20 at  3:45 PM EDT by MyChart Video Encounter at home and verified that I am speaking with the correct person using two identifiers.   I discussed the limitations, risks, security and privacy concerns of performing an evaluation and management service virtually and the availability of in person appointments. I also discussed with the patient that there may be a patient responsible charge related to this service. The patient expressed understanding and agreed to proceed. Subjective:  Kim Mejia is a 19 y.o. [name] at [redacted]w[redacted]d being seen today for ongoing prenatal care.  She is currently monitored for the following issues for this low-risk pregnancy and has Pain of left calf; BMI (body mass index), pediatric, 95-99% for age; Language barrier; Supervision of other normal pregnancy, antepartum; UTI in pregnancy, antepartum, first trimester; Cystic fibrosis carrier, antepartum; Obesity in pregnancy; and Anemia in pregnancy on their problem list.  Patient reports no complaints.  Contractions: Not present. Vag. Bleeding: None.  Movement: Present. Denies any leaking of fluid.   The following portions of the patient's history were reviewed and updated as appropriate: allergies, current medications, past family history, past medical history, past social history, past surgical history and problem list.   Objective:  There were no vitals filed for this visit.  Fetal Status:     Movement: Present     General:  Alert, oriented and cooperative. Patient is in no acute distress.  Respiratory: Normal respiratory effort, no problems with respiration noted  Mental Status: Normal mood and affect. Normal behavior. Normal judgment and thought content.  Rest of physical exam deferred due to type of encounter  Imaging: [place] MFM OB FOLLOW UP  Result Date:  03/18/2020 ----------------------------------------------------------------------  OBSTETRICS REPORT                       (Signed Final 03/18/2020 10:53 am) ---------------------------------------------------------------------- Patient Info  ID #:       [id number]                          D.O.B.:  2001/07/26 (18 yrs)  Name:       Kim Mejia                  Visit Date: 03/18/2020 09:57 am ---------------------------------------------------------------------- Performed By  Performed By:     Emeline Darling BS,      Ref. Address:     [address]                                                             [phone]  [place]                                                             [address]  Attending:        Ma Rings MD         [facility]  Referred By:      [facility] ---------------------------------------------------------------------- Orders   #  Description                          Code         Ordered By   1  [place] MFM OB FOLLOW UP                  [phone]     Rosana Hoes  ----------------------------------------------------------------------   #  Order #                    Accession #                 Episode #   1  [id number]                  [id number]                  [id number]  ---------------------------------------------------------------------- Indications   Obesity complicating pregnancy, third          O99.213   trimester   Cystic Fibrosis (CF) Carrier, third trimester  O09.893   Encounter for other antenatal screening        Z36.2   follow-up   [redacted] weeks gestation of pregnancy                Z3A.32   Anemia during pregnancy in third trimester     O99.013  ---------------------------------------------------------------------- Vital Signs  Weight (lb): 240                                Height:        5'3"  BMI:         42.51 ---------------------------------------------------------------------- Fetal Evaluation  Num Of Fetuses:         1  Fetal Heart Rate(bpm):  146  Cardiac Activity:       Observed  Presentation:           Cephalic  Placenta:               Posterior  P. Cord Insertion:      Previously Visualized  Amniotic Fluid  AFI FV:      Within normal limits  AFI Sum(cm)     %Tile       Largest Pocket(cm)  18.32           68  6.61  RUQ(cm)       RLQ(cm)       LUQ(cm)        LLQ(cm)  5.88          1.72          4.11           6.61 ---------------------------------------------------------------------- Biometry  BPD:      89.6  mm     G. Age:  36w 2d       > 99  %    CI:        80.65   %    70 - 86                                                          FL/HC:      19.2   %    19.9 - 21.5  HC:      315.1  mm     G. Age:  35w 3d         84  %    HC/AC:      1.03        0.96 - 1.11  AC:      305.6  mm     G. Age:  34w 4d         93  %    FL/BPD:     67.6   %    71 - 87  FL:       60.6  mm     G. Age:  31w 4d         14  %    FL/AC:      19.8   %    20 - 24  Est. FW:    2310  gm      5 lb 1 oz     82  % ---------------------------------------------------------------------- OB History  Gravidity:    1 ---------------------------------------------------------------------- Gestational Age  Clinical EDD:  32w 4d                                        EDD:   05/09/20  U/S Today:     34w 3d                                        EDD:   04/26/20  Best:          32w 4d     Det. By:  Clinical EDD             EDD:   05/09/20 ---------------------------------------------------------------------- Anatomy  Cranium:               Appears normal         LVOT:                   Previously seen  Cavum:                 Previously seen        Aortic Arch:            Previously seen  Ventricles:  Appears normal         Ductal Arch:            Previously seen  Choroid Plexus:         Previously seen        Diaphragm:              Previously seen  Cerebellum:            Previously seen        Stomach:                Appears normal, left                                                                        sided  Posterior Fossa:       Previously seen        Abdomen:                Appears normal  Nuchal Fold:           Not applicable (>20    Abdominal Wall:         Previously seen                         wks GA)  Face:                  Orbits and profile     Cord Vessels:           Previously seen                         previously seen  Lips:                  Previously seen        Kidneys:                Appear normal  Palate:                Previously seen        Bladder:                Appears normal  Thoracic:              Appears normal         Spine:                  Previously seen  Heart:                 Previously seen        Upper Extremities:      Previously seen  RVOT:                  Previously seen        Lower Extremities:      Previously seen  Other:  Technicallly difficult due to advanced GA and maternal habitus. ---------------------------------------------------------------------- Cervix Uterus Adnexa  Cervix  Not visualized (advanced GA >24wks) ---------------------------------------------------------------------- Comments  This patient was seen for a follow up growth scan due to due  to maternal obesity.  She denies any problems since her last  exam and reports that she has  screened negative for  gestational diabetes.  She was informed that the fetal growth and amniotic fluid  level appears appropriate for her gestational age.  As the fetal growth is within normal limits, no further exams  were scheduled in our office. ----------------------------------------------------------------------                   Johnell Comings, MD Electronically Signed Final Report   03/18/2020 10:53 am ----------------------------------------------------------------------   Assessment and  Plan:  Pregnancy: [name] at [redacted]w[redacted]d 1. Supervision of other normal pregnancy, antepartum  2. Antepartum anemia - taking iron  3. Obesity in pregnancy  4. Cystic fibrosis carrier, antepartum   Preterm labor symptoms and general obstetric precautions including but not limited to vaginal bleeding, contractions, leaking of fluid and fetal movement were reviewed in detail with the patient. I discussed the assessment and treatment plan with the patient. The patient was provided an opportunity to ask questions and all were answered. The patient agreed with the plan and demonstrated an understanding of the instructions. The patient was advised to call back or seek an in-person office evaluation/go to MAU at [facility] for any urgent or concerning symptoms. Please refer to After Visit Summary for other counseling recommendations.   I provided 10 minutes of face-to-face time during this encounter.  Return in about 2 weeks (around 04/15/2020) for ROB.   Baltazar Najjar, MD [facility] 04/01/2020

## 2020-04-01 NOTE — Progress Notes (Signed)
Pt is on the phone preparing for virtual visit with provider. [redacted]w[redacted]d. Pt reports BP cuff is not working, she denies any HA's, blurry vision, or swelling.

## 2020-04-15 ENCOUNTER — Other Ambulatory Visit: Payer: Self-pay

## 2020-04-15 ENCOUNTER — Ambulatory Visit (INDEPENDENT_AMBULATORY_CARE_PROVIDER_SITE_OTHER): Payer: Medicaid Other | Admitting: Nurse Practitioner

## 2020-04-15 ENCOUNTER — Encounter: Payer: Self-pay | Admitting: Nurse Practitioner

## 2020-04-15 VITALS — BP 112/70 | HR 73 | Wt 250.0 lb

## 2020-04-15 DIAGNOSIS — Z3483 Encounter for supervision of other normal pregnancy, third trimester: Secondary | ICD-10-CM

## 2020-04-15 DIAGNOSIS — Z3480 Encounter for supervision of other normal pregnancy, unspecified trimester: Secondary | ICD-10-CM | POA: Diagnosis not present

## 2020-04-15 DIAGNOSIS — Z3A36 36 weeks gestation of pregnancy: Secondary | ICD-10-CM

## 2020-04-15 NOTE — Patient Instructions (Addendum)
Levonorgestrel intrauterine device (IUD) What is this medicine? LEVONORGESTREL IUD (LEE voe nor jes trel) is a contraceptive (birth control) device. The device is placed inside the uterus by a healthcare professional. It is used to prevent pregnancy. This device can also be used to treat heavy bleeding that occurs during your period. This medicine may be used for other purposes; ask your health care provider or pharmacist if you have questions. COMMON BRAND NAME(S): Kyleena, LILETTA, Mirena, Skyla What should I tell my health care provider before I take this medicine? They need to know if you have any of these conditions:  abnormal Pap smear  cancer of the breast, uterus, or cervix  diabetes  endometritis  genital or pelvic infection now or in the past  have more than one sexual partner or your partner has more than one partner  heart disease  history of an ectopic or tubal pregnancy  immune system problems  IUD in place  liver disease or tumor  problems with blood clots or take blood-thinners  seizures  use intravenous drugs  uterus of unusual shape  vaginal bleeding that has not been explained  an unusual or allergic reaction to levonorgestrel, other hormones, silicone, or polyethylene, medicines, foods, dyes, or preservatives  pregnant or trying to get pregnant  breast-feeding How should I use this medicine? This device is placed inside the uterus by a health care professional. Talk to your pediatrician regarding the use of this medicine in children. Special care may be needed. Overdosage: If you think you have taken too much of this medicine contact a poison control center or emergency room at once. NOTE: This medicine is only for you. Do not share this medicine with others. What if I miss a dose? This does not apply. Depending on the brand of device you have inserted, the device will need to be replaced every 3 to 6 years if you wish to continue using this type  of birth control. What may interact with this medicine? Do not take this medicine with any of the following medications:  amprenavir  bosentan  fosamprenavir This medicine may also interact with the following medications:  aprepitant  armodafinil  barbiturate medicines for inducing sleep or treating seizures  bexarotene  boceprevir  griseofulvin  medicines to treat seizures like carbamazepine, ethotoin, felbamate, oxcarbazepine, phenytoin, topiramate  modafinil  pioglitazone  rifabutin  rifampin  rifapentine  some medicines to treat HIV infection like atazanavir, efavirenz, indinavir, lopinavir, nelfinavir, tipranavir, ritonavir  St. John's wort  warfarin This list may not describe all possible interactions. Give your health care provider a list of all the medicines, herbs, non-prescription drugs, or dietary supplements you use. Also tell them if you smoke, drink alcohol, or use illegal drugs. Some items may interact with your medicine. What should I watch for while using this medicine? Visit your doctor or health care professional for regular check ups. See your doctor if you or your partner has sexual contact with others, becomes HIV positive, or gets a sexual transmitted disease. This product does not protect you against HIV infection (AIDS) or other sexually transmitted diseases. You can check the placement of the IUD yourself by reaching up to the top of your vagina with clean fingers to feel the threads. Do not pull on the threads. It is a good habit to check placement after each menstrual period. Call your doctor right away if you feel more of the IUD than just the threads or if you cannot feel the threads at   all. The IUD may come out by itself. You may become pregnant if the device comes out. If you notice that the IUD has come out use a backup birth control method like condoms and call your health care provider. Using tampons will not change the position of the  IUD and are okay to use during your period. This IUD can be safely scanned with magnetic resonance imaging (MRI) only under specific conditions. Before you have an MRI, tell your healthcare provider that you have an IUD in place, and which type of IUD you have in place. What side effects may I notice from receiving this medicine? Side effects that you should report to your doctor or health care professional as soon as possible:  allergic reactions like skin rash, itching or hives, swelling of the face, lips, or tongue  fever, flu-like symptoms  genital sores  high blood pressure  no menstrual period for 6 weeks during use  pain, swelling, warmth in the leg  pelvic pain or tenderness  severe or sudden headache  signs of pregnancy  stomach cramping  sudden shortness of breath  trouble with balance, talking, or walking  unusual vaginal bleeding, discharge  yellowing of the eyes or skin Side effects that usually do not require medical attention (report to your doctor or health care professional if they continue or are bothersome):  acne  breast pain  change in sex drive or performance  changes in weight  cramping, dizziness, or faintness while the device is being inserted  headache  irregular menstrual bleeding within first 3 to 6 months of use  nausea This list may not describe all possible side effects. Call your doctor for medical advice about side effects. You may report side effects to FDA at 1-800-FDA-1088. Where should I keep my medicine? This does not apply. NOTE: This sheet is a summary. It may not cover all possible information. If you have questions about this medicine, talk to your doctor, pharmacist, or health care provider.  2020 Elsevier/Gold Standard (2018-10-24 13:22:01)  Intrauterine Device Information An intrauterine device (IUD) is a medical device that is inserted in the uterus to prevent pregnancy. It is a small, T-shaped device that has one  or two nylon strings hanging down from it. The strings hang out of the lower part of the uterus (cervix) to allow for future IUD removal. There are two types of IUDs available:  Hormone IUD. This type of IUD is made of plastic and contains the hormone progestin (synthetic progesterone). A hormone IUD may last 3-5 years.  Copper IUD. This type of IUD has copper wire wrapped around it. A copper IUD may last up to 10 years. How is an IUD inserted? An IUD is inserted through the vagina and placed into the uterus with a minor medical procedure. The exact procedure for IUD insertion may vary among health care providers and hospitals. How does an IUD work? Synthetic progesterone in a hormonal IUD prevents pregnancy by:  Thickening cervical mucus to prevent sperm from entering the uterus.  Thinning the uterine lining to prevent a fertilized egg from being implanted there. Copper in a copper IUD prevents pregnancy by making the uterus and fallopian tubes produce a fluid that kills sperm. What are the advantages of an IUD? Advantages of either type of IUD  It is highly effective in preventing pregnancy.  It is reversible. You can become pregnant shortly after the IUD is removed.  It is low-maintenance and can stay in place for a  long time.  There are no estrogen-related side effects.  It can be used when breastfeeding.  It is not associated with weight gain.  It can be inserted right after childbirth, an abortion, or a miscarriage. Advantages of a hormone IUD  If it is inserted within 7 days of your period starting, it works right after it is inserted. If the hormone IUD is inserted at any other time in your cycle, you will need to use a backup method of birth control for 7 days after insertion.  It can make menstrual periods lighter.  It can reduce menstrual cramping.  It can be used for 3-5 years. Advantages of a copper IUD  It works right after it is inserted.  It can be used as a  form of emergency birth control if it is inserted within 5 days after having unprotected sex.  It does not interfere with your body's natural hormones.  It can be used for 10 years. What are the disadvantages of an IUD?  An IUD may cause irregular menstrual bleeding for a period of time after insertion.  You may have pain during insertion and have cramping and vaginal bleeding after insertion.  An IUD may cut the uterus (uterine perforation) when it is inserted. This is rare.  An IUD may cause pelvic inflammatory disease (PID), which is an infection in the uterus and fallopian tubes. This is rare, and it usually happens during the first 20 days after the IUD is inserted.  A copper IUD can make your menstrual flow heavier and more painful. How is an IUD removed?  You will lie on your back with your knees bent and your feet in footrests (stirrups).  A device will be inserted into your vagina to spread apart the vaginal walls (speculum). This will allow your health care provider to see the strings attached to the IUD.  Your health care provider will use a small instrument (forceps) to grasp the IUD strings and pull firmly until the IUD is removed. You may have some discomfort when the IUD is removed. Your health care provider may recommend taking over-the-counter pain relievers, such as ibuprofen, before the procedure. You may also have minor spotting for a few days after the procedure. The exact procedure for IUD removal may vary among health care providers and hospitals. Is the IUD right for me? Your health care provider will make sure you are a good candidate for an IUD and will discuss the advantages, disadvantages, and possible side effects with you. Summary  An intrauterine device (IUD) is a medical device that is inserted in the uterus to prevent pregnancy. It is a small, T-shaped device that has one or two nylon strings hanging down from it.  A hormone IUD contains the hormone  progestin (synthetic progesterone). A copper IUD has copper wire wrapped around it.  Synthetic progesterone in a hormone IUD prevents pregnancy by thickening cervical mucus and thinning the walls of the uterus. Copper in a copper IUD prevents pregnancy by making the uterus and fallopian tubes produce a fluid that kills sperm.  A hormone IUD can be left in place for 3-5 years. A copper IUD can be left in place for up to 10 years.  An IUD is inserted and removed by a health care provider. You may feel some pain during insertion and removal. Your health care provider may recommend taking over-the-counter pain medicine, such as ibuprofen, before an IUD procedure. This information is not intended to replace advice given to  you by your health care provider. Make sure you discuss any questions you have with your health care provider. Document Revised: 11/25/2017 Document Reviewed: 01/11/2017 Elsevier Patient Education  2020 Elsevier Inc.   AREA PEDIATRIC/FAMILY PRACTICE PHYSICIANS  [place] ([id number]) . [facility] Melodie Bouillono Chambliss, MD; Lum BabeEniola, MD; Sheffield SliderHale, MD; Leveda AnnaHensel, MD; McDiarmid, MD; Jerene BearsMcIntyer, MD; Jennette KettleNeal, MD; Gwendolyn GrantWalden, MD o [address] [place], [place] [address] o [phone] o Mon-Fri 8:30-12:30, 1:30-5:00 o Providers come to see babies at [facility] o Accepting Medicaid . [facility] o Limited providers who accept newborns: Docia ChuckKoirala, MD; Kateri PlummerMorrow, MD; Paulino RilyWolters, MD o [address], [place], [place] [address] o [phone] o Mon-Fri 8:00-5:30 o Babies seen by providers at [facility] o Does NOT accept Medicaid o Please call early in hospitalization for appointment (limited availability)  . [facility] Fatima Sangero Mulberry, MD o [address] [place], [place] [address] o [phone] o Mon, Tue, Thur, Fri 8:30-5:00, Wed 10:00-7:00 (closed 1-2pm) o Babies seen by [facility]  providers o Accepting Medicaid . Donnie Coffinubin - Pediatrician Fae Pippino Rubin, MD o [address], [place], [place] [address] o [phone] o Mon-Fri 8:30-5:00, Sat 8:30-12:00 o Provider comes to see babies at [facility] o Accepting Medicaid o Must have been referred from current patients or contacted office prior to delivery . [facility] Leotis Paino Brown, MD; Ave Filterhandler, MD; Luna FuseEttefagh, MD; Kennedy BuckerGrant, MD; Konrad DoloresLester, MD; Kathlene NovemberMcCormick, MD; Jenne CampusMcQueen, MD; Lubertha SouthProse, MD; Wynetta EmerySimha, MD; Duffy RhodyStanley, MD; Gerre CouchStryffeler, NP; Shirl Harrisebben, NP o [address], [place], [place] [address] o [phone] o Mon, Tue, Thur, Fri 8:30-5:30, Wed 9:30-5:30, Sat 8:30-12:30 o Babies seen by [facility] providers o Accepting Medicaid o Only accepting infants of first-time parents or siblings of current patients [facility] discharge coordinator will make follow-up appointment . Cyril MourningJack Amos o [address], [place], [place]  [address] o [phone]   Fax - [phone] . [facility] o [address], [place], [place]  [address] o Phone - [phone]   Fax - [phone] . Lucio EdwardShilpa Gosrani o [address], [place], [place]  [address] o [phone]  [place] [phone]) . [facility] Jorge Mandrilo Bates, MD; Alita ChyleBrass[place], MD; Princella Ionooper, Cox, MD; MD; Earlene Plateravis, MD; Jamesetta Orleansovico, MD; Alvera NovelEttefaugh, MD; Clarene DukeLittle, MD; Rana SnareLowe, MD; Carmon GinsbergKeiffer, MD; Alinda MoneyMelvin, MD; Hosie PoissonSumner, MD; Mayford KnifeWilliams, MD o [address], [place], [place] [address] o [phone] o Mon-Fri 8:30-5:00 (extended evenings Mon-Thur as needed), Sat-Sun 10:00-1:00 o Providers come to see babies at [facility] o Accepting Medicaid for families of first-time babies and families with all children in the household age 653 and under. Must register with office prior to making appointment (M-F only). Alric Quan. [facility] Odella Aquaso Henson, NP; Lynelle DoctorKnapp, MD; Susann GivensLalonde, MD; [place], [place] o [address]  [place], [place] [address] o [phone] o Mon-Fri 8:00-5:00 o Babies seen by providers at [facility] o Does NOT accept Medicaid/Commercial Insurance Only . [facility] [place] [facility]  Suzette Battiesto Artis, MD; Zachery DauerBarnes, MD; Stefan ChurchBratton, MD; Sabino Dickoccaro, MD; Quitman LivingsLockett Gardner, MD; Farris HasKramer, MD; Gaynell FaceMarshall, MD; Betha LoaNetherton, MD; Colon FlatteryPoleto, MD; Clifton JamesSkinner, MD o [address] [place], [place] [address] o [phone] o Mon-Fri 8:30-5:30, Sat (Oct.-Mar.) 9:00-1:00 o Babies seen by providers at [facility] o Accepting [facility]  [place] [phone]) . [facility] [place] o Reid, MD; Sheliah HatchWarner, MD o  [address], [place], [place] [address] o [phone] o Mon-Fri 8:30-5:00, Sat 8:30-12:00 o Providers come to see babies at [facility] o Does NOT accept Medicaid . [facility] Cindy Hazy, [place]; McQueeney, MD; [place], [place]; Wynelle Link, MD; Azucena Cecil, MD o [address], [place], [place] [address] o [phone] o Mon-Fri 8:00-5:00 o Babies seen by providers at [facility] o Does NOT accept Medicaid o Only accepting babies of parents who are patients o Please call early in hospitalization for appointment (limited availability) . [facility] Lamar Benes, MD; Abran Cantor, MD; Early Osmond, MD; Cherre Huger, NP; Hyacinth Meeker, MD; Dwan Bolt, MD; Jarold Motto, NP; Dario Guardian, MD; Talmage Nap, MD; Maisie Fus, MD; Pricilla Holm, MD; Tama High, MD o [address] [place], [place], [place] [address] o [phone] o Mon-Fri 8:00-5:00, Sat 9:00-12:00 o Providers come to see babies at [facility] o Does NOT accept [facility] [phone]) . [facility] o Limited providers accepting new patients: Drema Pry, NP; [place], [place] o [address] [place], [place] [address] o [phone] o Mon-Fri 8:00-5:00 o Babies seen by providers at [facility] o Does NOT accept Medicaid o Only accepting babies of parents who are  patients o Please call early in hospitalization for appointment (limited availability) . [facility] Luan Pulling, MD; Nash Dimmer, MD o [address] [employer] [place], [place] [address] o [phone] (press 1 to schedule appointment) o Mon-Fri 8:00-5:00 o Providers come to see babies at [facility] o Does NOT accept Medicaid . [facility] Cristino Martes, MD o [address] [place], [place] [address] o [phone] o Mon-Fri 8:30-5:00 (lunch 12:30-1:00), extended hours by appointment only Wed 5:00-6:30 o Babies seen by [facility] providers o Accepting Medicaid . [facility], MD; [place], MD; Hassan Rowan, MD o [address] [place], [place], [place] [address] o [phone] o Mon-Fri 8:00-5:00 o Babies seen by [facility] providers o Does NOT accept Medicaid . Nature conservation officer at [place] [address] Elsworth Soho, MD; Durene Cal, MD; [place], DO o [address] [place], [place] [address] o [phone] o Mon-Fri 8:00-5:00 o Babies seen by [facility] providers o Does NOT accept Medicaid . [facility] o [place], [place]; [place], [place]; [place], NP; Avis Epley, MD; Vonna Kotyk, MD; Clance Boll, MD; Stevphen Rochester, NP; Arvilla Market, NP; Ann Maki, NP; Otis Dials, NP; Vaughan Basta, MD; [place], MD o [address] [place], [place] [address] o [phone] o Mon-Fri 8:30-5:00, Sat 10:00-1:00 o Providers come to see babies at [facility] o Does NOT accept Medicaid o Free prenatal information session Tuesdays at 4:45pm . [facility] Luna Kitchens, MD; [place], [place]; [place], [place]; [place], [place] o [address] [place] [address] o [phone] o Mon-Fri 7:30-5:30 o Babies seen by [facility] providers . [facility] o [address], [place], [place]  [address] o [phone]   Fax - [phone]  [place] [phone] & [phone]) . [facility] Alphonsa Overall, MD o [address] [place], [place]  [address] o [phone] o Mon-Thur 8:00-6:00 o Providers come to see babies at [address] [facility] o Accepting Medicaid . [facility] Zenon Mayo, NP; Cyndia Bent, MD; [place], [place]; [place], [place] o [address] [place], [place] [address] o [phone] o Mon-Thur 7:30-7:30, Fri 7:30-4:30 o Babies seen by [facility] providers o Accepting Medicaid . [facility] Cheryle Horsfall, MD; Janene Harvey, NP; Vonita Moss, MD o [address], [place], [place] [address] o [phone] o Mon-Fri 8:30-5:00, Sat 8:30-12:00 o  Providers come to see babies at [facility] o Accepting Medicaid o Must have "Meet & Greet" appointment at office prior to delivery . [facility] Llana Aliment, MD; Earlene Plater, MD; Lucretia Roers, MD o [address], [place], [place] [address] o [phone] o Mon-Wed 8:00-6:00, Thur-Fri 8:00-5:00, Sat 9:00-12:00 o Providers come to see babies at [facility] o Does NOT accept Medicaid o Only accepting siblings of current patients . [facility]  o [address], [place], [place]  [address] o [phone]   Fax - [phone] . [facility] at [facility] o [phone] [address] [place], [place]  [address] o [phone]   Fax - [phone]  [place] [phone] & [phone]) . Nature conservation officer at [facility] o Hux[place], DO; [place], DO o [address] [place], [place] [address] o [phone] o Mon-Fri 7:00-5:00 o Babies seen by [facility] providers o Does NOT accept Medicaid . [facility] Ellis Savage, MD; [place], [place]; [place], [place] o [address], [place], [place] [address] o [phone] o Mon-Fri 8:00-5:00 o Babies seen by [facility] providers o Accepting Medicaid . [facility] - [address] Franne Forts, MD; [place], [place]; [place], NP; [place],  [place] o [address] [place], [place], [place] [address] o [phone] o Mon-Fri 8:00-5:00 o Babies seen by providers at [facility] o Accepting [facility] [phone]) . [facility] [place], [place] o [address] [place], [place] [address] o [phone] o Mon-Fri 8:00-5:00 o Babies seen by [facility] providers o Does NOT accept Medicaid o Limited availability, please call early in hospitalization to schedule follow-up . [facility] Jolee Ewing, PA; Eddie Candle, MD; [place], MD; [place], [place]; Constance Goltz, MD; [place], [place] o [facility] [facility] [address], [place], [place] [address] o [phone] o Mon-Fri 8:30-5:00, Sat 9:00-12:00 o Babies seen by providers at [facility] o Accepting Medicaid o Please register online then schedule online or call office o [email or web address] . [facility]) Samuella Bruin, NP; Lucianne Muss, MD; Lanier Clam, PA o [address], [place], [place] [address] o [phone] o Mon-Fri 8:00-5:00 o Babies seen by providers at [facility] o Accepting Medicaid . [facility]) Sharin Mons, MD; Reed Breech, NP; Shelva Majestic, MD o [address], [place], [place] [address] o [phone] o Mon-Fri 8:00-5:30, Sat&Sun by appointment (phones open at 8:30) o Babies seen by [facility] providers o Accepting Medicaid o Must be a first-time baby or sibling of current patient . [facility] [place] [address], [address], [place], [place]  [address] o [phone]   Fax - [phone]  [place] [phone] & [phone]) . [facility] Medicine o [place], [place]; [place], [place]; Dimple Casey, MD; [place], [place]; Carolyne Fiscal, MD o [address], [place], [place] [address] o [phone] o Mon-Thur 8:00-7:00, Fri 8:00-5:00, Sat 8:00-12:00, Sun 9:00-12:00 o Babies seen by [facility] providers o Accepting Medicaid . [facility] at [facility], MD; Gaynell Face, MD; [facility], MD o [address], [place], [place] [address] o [phone] o Mon-Thur 8:00-5:00 o Babies seen by providers at [facility] o Accepting Medicaid . [facility] at [employer]  Gwenlyn Saran, MD; [place], MD; Madilyn Fireman, MD; Melvyn Neth, MD; List, MD; Lazarus Salines, MD; Gaynell Face, MD; Berneda Rose, MD; Flora Lipps, MD; Beryl Meager, MD; Luther Redo, MD; Lavonia Drafts, MD; Kellie Simmering, MD o [address] [place], [place] [address] o [phone] o Mon-Fri 8:00-5:30, Sat (Oct.-Mar.) 9:00-1:00 o Babies seen by providers at [facility] o Accepting Medicaid o Must fill out new patient packet, available online at [email or web address] . [facility] - [place] [facility] at [facility]) Simone Curia, NP; Tiburcio Pea, NP; Tresa Endo, NP; Whitney Post, MD; [place], [place]; Hennie Duos, MD; Wynne Dust, MD; Kavin Leech, NP o [address], [place], [place] [address] o [phone] o Mon-Thur 8:00-5:30, Fri 8:00-5:00 o Babies seen by providers at [facility] o Accepting [facility] [phone]) . [facility] o [place], [place]; [place], MD; Tanya Nones, MD; [place], [place] o [address] [place], [place] [address] o [phone] o Mon-Fri 8:00-5:00 o Babies seen by providers at [facility] o Accepting [facility] [phone]) . [facility] at [facility] o [place], DO; Lenise Arena, MD; [place], [place] o [address], [place], [place] [address] o [phone] o Mon-Fri 8:00-5:00 o Babies seen by providers at [facility] o Does NOT accept Medicaid o Limited appointment availability, please call early in hospitalization  . Nature conservation officer at [facility] o [place], DO; [place], MD o [address], [place], [place] [address] o [phone] o Mon-Fri 8:00-5:00 o Babies seen by [facility] providers o Does NOT accept Medicaid . [facility] Lorrine Kin, MD; Ninetta Lights, MD; [place], [place]; Neal, MD o [address], [place], [place] [address] o [phone] o Mon-Fri 8:00-5:00 o After hours clinic [facility] [place], [place] [address]) [phone] Mon-Fri 5:00-8:00, Sat 12:00-6:00, Sun 10:00-4:00 o Babies seen by [facility] providers o Accepting Medicaid . [facility] at [facility] o [address], [place], [place]  [address] o [phone]   Fax - [phone]  [place] [phone]) . Nature conservation officer at [facility], MD o [address] [place], [place], [place] [address] o [phone] o Mon-Fri 8:00-5:00 o Babies seen by [facility] providers o Does NOT accept Medicaid . [facility] - [place] [facility] [place]) Tomi Likens, MD o [address] [place] [address] [place], [place] [address] o [phone] o Mon-Thur 8:00-7:00, Fri 8:00-5:00, Sat 8:00-12:00 o Babies seen by providers at [facility] o Accepting Medicaid - but does not have vaccinations in office (must be received elsewhere) o Limited availability, please call early in hospitalization  [place] ([phone]) . [facility]  o Wyvonne Lenz, MD o [address], [place] [address] o [phone]  Fax [phone]

## 2020-04-15 NOTE — Progress Notes (Signed)
Pt presents for ROB. GBS due today  GC/CT neg 03/26/20

## 2020-04-15 NOTE — Progress Notes (Signed)
    Subjective:  Kim Mejia is a 19 y.o. [name] at [redacted]w[redacted]d being seen today for ongoing prenatal care.  She is currently monitored for the following issues for this low-risk pregnancy and has Pain of left calf; BMI (body mass index), pediatric, 95-99% for age; Language barrier; Supervision of other normal pregnancy, antepartum; UTI in pregnancy, antepartum, first trimester; Cystic fibrosis carrier, antepartum; Obesity in pregnancy; and Anemia in pregnancy on their problem list.  Patient reports increased pelvic pressure.  Contractions: Not present. Vag. Bleeding: None.  Movement: Present. Denies leaking of fluid.   The following portions of the patient's history were reviewed and updated as appropriate: allergies, current medications, past family history, past medical history, past social history, past surgical history and problem list. Problem list updated.  Objective:   Vitals:   04/15/20 0957  BP: 112/70  Pulse: 73  Weight: 250 lb (113.4 kg)    Fetal Status: Fetal Heart Rate (bpm): 135 Fundal Height: 36 cm Movement: Present  Presentation: Vertex  General:  Alert, oriented and cooperative. Patient is in no acute distress.  Skin: Skin is warm and dry. No rash noted.   Cardiovascular: Normal heart rate noted  Respiratory: Normal respiratory effort, no problems with respiration noted  Abdomen: Soft, gravid, appropriate for gestational age. Pain/Pressure: Absent     Pelvic:  Cervical exam performed Dilation: Closed   Station: Ballotable  Extremities: Normal range of motion.  Edema: None  Mental Status: Normal mood and affect. Normal behavior. Normal judgment and thought content.   Urinalysis:      Assessment and Plan:  Pregnancy: [name] at [redacted]w[redacted]d  1. Supervision of other normal pregnancy, antepartum Doing well Checking BPs but does not add to babyscripts No edema Baby moving well Wants [facility] at [facility] visit Will work on selecting Pediatrician  - Strep Gp B NAA  Term labor symptoms  and general obstetric precautions including but not limited to vaginal bleeding, contractions, leaking of fluid and fetal movement were reviewed in detail with the patient. Please refer to After Visit Summary for other counseling recommendations.  Return in about 1 week (around 04/22/2020) for virtual ROB.  Nolene Bernheim, RN, MSN, NP-BC Nurse Practitioner, [facility] for [employer], [facility] 04/15/2020 10:18 AM

## 2020-04-17 LAB — STREP GP B NAA: Strep Gp B NAA: POSITIVE — AB

## 2020-04-20 ENCOUNTER — Encounter: Payer: Self-pay | Admitting: Nurse Practitioner

## 2020-04-20 DIAGNOSIS — O99820 Streptococcus B carrier state complicating pregnancy: Secondary | ICD-10-CM | POA: Insufficient documentation

## 2020-04-22 ENCOUNTER — Encounter: Payer: Self-pay | Admitting: Obstetrics

## 2020-04-22 ENCOUNTER — Telehealth (INDEPENDENT_AMBULATORY_CARE_PROVIDER_SITE_OTHER): Payer: Medicaid Other | Admitting: Obstetrics

## 2020-04-22 DIAGNOSIS — O09899 Supervision of other high risk pregnancies, unspecified trimester: Secondary | ICD-10-CM

## 2020-04-22 DIAGNOSIS — Z141 Cystic fibrosis carrier: Secondary | ICD-10-CM

## 2020-04-22 DIAGNOSIS — Z3480 Encounter for supervision of other normal pregnancy, unspecified trimester: Secondary | ICD-10-CM

## 2020-04-22 NOTE — Progress Notes (Signed)
Virtual Visit via Telephone Note  I connected with Kim Mejia on 04/22/20 at  1:45 PM EDT by telephone and verified that I am speaking with the correct person using two identifiers.  Pt does not have BP cuff with her right now.

## 2020-04-22 NOTE — Progress Notes (Signed)
   OBSTETRICS PRENATAL VIRTUAL VISIT ENCOUNTER NOTE  Provider location: Center for [facility] at [place]   I connected with [name] on 04/22/20 at  1:45 PM EDT by MyChart Video Encounter at home and verified that I am speaking with the correct person using two identifiers.   I discussed the limitations, risks, security and privacy concerns of performing an evaluation and management service virtually and the availability of in person appointments. I also discussed with the patient that there may be a patient responsible charge related to this service. The patient expressed understanding and agreed to proceed. Subjective:  Kim Mejia is a 19 y.o. [name] at [redacted]w[redacted]d being seen today for ongoing prenatal care.  She is currently monitored for the following issues for this low-risk pregnancy and has Pain of left calf; BMI (body mass index), pediatric, 95-99% for age; Language barrier; Supervision of other normal pregnancy, antepartum; UTI in pregnancy, antepartum, first trimester; Cystic fibrosis carrier, antepartum; Obesity in pregnancy; Anemia in pregnancy; and Group B Streptococcus carrier, +RV culture, currently pregnant on their problem list.  Patient reports backache.   .  .   . Denies any leaking of fluid.   The following portions of the patient's history were reviewed and updated as appropriate: allergies, current medications, past family history, past medical history, past social history, past surgical history and problem list.   Objective:  There were no vitals filed for this visit.  Fetal Status:           General:  Alert, oriented and cooperative. Patient is in no acute distress.  Respiratory: Normal respiratory effort, no problems with respiration noted  Mental Status: Normal mood and affect. Normal behavior. Normal judgment and thought content.  Rest of physical exam deferred due to type of encounter  Imaging: No results found.  Assessment and Plan:  Pregnancy: [name]  at [redacted]w[redacted]d 1. Supervision of other normal pregnancy, antepartum  2. Cystic fibrosis carrier, antepartum   Term labor symptoms and general obstetric precautions including but not limited to vaginal bleeding, contractions, leaking of fluid and fetal movement were reviewed in detail with the patient. I discussed the assessment and treatment plan with the patient. The patient was provided an opportunity to ask questions and all were answered. The patient agreed with the plan and demonstrated an understanding of the instructions. The patient was advised to call back or seek an in-person office evaluation/go to MAU at [facility] for any urgent or concerning symptoms. Please refer to After Visit Summary for other counseling recommendations.   I provided 10 minutes of face-to-face time during this encounter.  Return in about 1 week (around 04/29/2020) for MyChart.   Coral Ceo, MD Center for [facility], [facility] 04/22/2020

## 2020-05-02 ENCOUNTER — Telehealth (INDEPENDENT_AMBULATORY_CARE_PROVIDER_SITE_OTHER): Payer: Medicaid Other | Admitting: Obstetrics

## 2020-05-02 ENCOUNTER — Encounter: Payer: Self-pay | Admitting: *Deleted

## 2020-05-02 ENCOUNTER — Encounter: Payer: Self-pay | Admitting: Obstetrics

## 2020-05-02 DIAGNOSIS — Z3480 Encounter for supervision of other normal pregnancy, unspecified trimester: Secondary | ICD-10-CM

## 2020-05-02 DIAGNOSIS — O99210 Obesity complicating pregnancy, unspecified trimester: Secondary | ICD-10-CM

## 2020-05-02 DIAGNOSIS — Z141 Cystic fibrosis carrier: Secondary | ICD-10-CM

## 2020-05-02 DIAGNOSIS — O99019 Anemia complicating pregnancy, unspecified trimester: Secondary | ICD-10-CM

## 2020-05-02 DIAGNOSIS — O09899 Supervision of other high risk pregnancies, unspecified trimester: Secondary | ICD-10-CM

## 2020-05-02 NOTE — Progress Notes (Signed)
   OBSTETRICS PRENATAL VIRTUAL VISIT ENCOUNTER NOTE  Provider location: Center for [facility] at [place]   I connected with Kim Mejia on 05/02/20 at 10:15 AM EDT by MyChart Video Encounter at home and verified that I am speaking with the correct person using two identifiers.   I discussed the limitations, risks, security and privacy concerns of performing an evaluation and management service virtually and the availability of in person appointments. I also discussed with the patient that there may be a patient responsible charge related to this service. The patient expressed understanding and agreed to proceed. Subjective:  Kim Mejia is a 19 y.o. [name] at [redacted]w[redacted]d being seen today for ongoing prenatal care.  She is currently monitored for the following issues for this low-risk pregnancy and has Pain of left calf; BMI (body mass index), pediatric, 95-99% for age; Language barrier; Supervision of other normal pregnancy, antepartum; UTI in pregnancy, antepartum, first trimester; Cystic fibrosis carrier, antepartum; Obesity in pregnancy; Anemia in pregnancy; and Group B Streptococcus carrier, +RV culture, currently pregnant on their problem list.  Patient reports no complaints.  Contractions: Not present. Vag. Bleeding: None.  Movement: Present. Denies any leaking of fluid.   The following portions of the patient's history were reviewed and updated as appropriate: allergies, current medications, past family history, past medical history, past social history, past surgical history and problem list.   Objective:  There were no vitals filed for this visit.  Fetal Status:     Movement: Present     General:  Alert, oriented and cooperative. Patient is in no acute distress.  Respiratory: Normal respiratory effort, no problems with respiration noted  Mental Status: Normal mood and affect. Normal behavior. Normal judgment and thought content.  Rest of physical exam deferred due to type of  encounter  Imaging: No results found.  Assessment and Plan:  Pregnancy: [name] at [redacted]w[redacted]d 1. Supervision of other normal pregnancy, antepartum  2. Antepartum anemia  3. Obesity in pregnancy  4. Cystic fibrosis carrier, antepartum   Term labor symptoms and general obstetric precautions including but not limited to vaginal bleeding, contractions, leaking of fluid and fetal movement were reviewed in detail with the patient. I discussed the assessment and treatment plan with the patient. The patient was provided an opportunity to ask questions and all were answered. The patient agreed with the plan and demonstrated an understanding of the instructions. The patient was advised to call back or seek an in-person office evaluation/go to MAU at [facility] for any urgent or concerning symptoms. Please refer to After Visit Summary for other counseling recommendations.   I provided 10 minutes of face-to-face time during this encounter.  Return in about 1 week (around 05/09/2020) for ROB.  Future Appointments  Date Time Provider Department Center  05/02/2020 10:15 AM Brock Bad, MD [facility] None    Brock Bad, MD 05/02/2020 10:23 AM

## 2020-05-02 NOTE — Progress Notes (Signed)
Induction Assessment Scheduling Form: Fax to [facility]:  [phone] Route to [facility] Labor Delivery   Kim Mejia                                                                                   DOB:  July 22, 2001                                                            MRN:  [id number]  Phone:  Home Phone [phone]  Mobile [phone]    Provider:  [facility] (Faculty Practice)  Admission Date/Time:  05-16-2020 GP:  [name]     Gestational age on admission:  41 weeks                                                Estimated Date of Delivery: 05/09/20  Dating Criteria: [place] at 41 weeks  There were no vitals filed for this visit.  GBS: Positive/-- (04/20 1034) HIV:  Non Reactive (02/09 4818)  Medical Indications for induction:  Postdates Scheduling Provider Signature:  Coral Ceo, MD         Method of induction(proposed):  Cytotec   Scheduling Provider Signature:  Coral Ceo, MD                                            Today's Date:  05/02/2020

## 2020-05-02 NOTE — Progress Notes (Signed)
Pt states that BP cuff is not working.

## 2020-05-06 ENCOUNTER — Telehealth (HOSPITAL_COMMUNITY): Payer: Self-pay | Admitting: *Deleted

## 2020-05-06 NOTE — Telephone Encounter (Signed)
Preadmission screen  

## 2020-05-07 ENCOUNTER — Encounter (HOSPITAL_COMMUNITY): Payer: Self-pay | Admitting: *Deleted

## 2020-05-07 ENCOUNTER — Telehealth (HOSPITAL_COMMUNITY): Payer: Self-pay | Admitting: *Deleted

## 2020-05-07 ENCOUNTER — Inpatient Hospital Stay (HOSPITAL_COMMUNITY)
Admission: AD | Admit: 2020-05-07 | Discharge: 2020-05-07 | Disposition: A | Discharge status: Home / Self Care | Payer: Medicaid Other | Attending: Family Medicine | Admitting: Family Medicine

## 2020-05-07 ENCOUNTER — Encounter (HOSPITAL_COMMUNITY): Payer: Self-pay | Admitting: Family Medicine

## 2020-05-07 DIAGNOSIS — R109 Unspecified abdominal pain: Secondary | ICD-10-CM

## 2020-05-07 DIAGNOSIS — R1032 Left lower quadrant pain: Secondary | ICD-10-CM | POA: Diagnosis not present

## 2020-05-07 DIAGNOSIS — O26899 Other specified pregnancy related conditions, unspecified trimester: Secondary | ICD-10-CM

## 2020-05-07 DIAGNOSIS — N949 Unspecified condition associated with female genital organs and menstrual cycle: Secondary | ICD-10-CM

## 2020-05-07 DIAGNOSIS — Z3689 Encounter for other specified antenatal screening: Secondary | ICD-10-CM

## 2020-05-07 DIAGNOSIS — Z3A39 39 weeks gestation of pregnancy: Secondary | ICD-10-CM | POA: Insufficient documentation

## 2020-05-07 DIAGNOSIS — O26893 Other specified pregnancy related conditions, third trimester: Secondary | ICD-10-CM | POA: Diagnosis not present

## 2020-05-07 DIAGNOSIS — Z7982 Long term (current) use of aspirin: Secondary | ICD-10-CM | POA: Diagnosis not present

## 2020-05-07 LAB — URINALYSIS, ROUTINE W REFLEX MICROSCOPIC
Bilirubin Urine: NEGATIVE
Glucose, UA: NEGATIVE mg/dL
Hgb urine dipstick: NEGATIVE
Ketones, ur: NEGATIVE mg/dL
Nitrite: NEGATIVE
Protein, ur: NEGATIVE mg/dL
Specific Gravity, Urine: 1.011 (ref 1.005–1.030)
pH: 6 (ref 5.0–8.0)

## 2020-05-07 NOTE — Discharge Instructions (Signed)
Labor and Vaginal Delivery  Vaginal delivery means that you give birth by pushing your baby out of your birth canal (vagina). A team of health care providers will help you before, during, and after vaginal delivery. Birth experiences are unique for every woman and every pregnancy, and birth experiences vary depending on where you choose to give birth. What happens when I arrive at the birth center or hospital? Once you are in labor and have been admitted into the hospital or birth center, your health care provider may:  Review your pregnancy history and any concerns that you have.  Insert an IV into one of your veins. This may be used to give you fluids and medicines.  Check your blood pressure, pulse, temperature, and heart rate (vital signs).  Check whether your bag of water (amniotic sac) has broken (ruptured).  Talk with you about your birth plan and discuss pain control options. Monitoring Your health care provider may monitor your contractions (uterine monitoring) and your baby's heart rate (fetal monitoring). You may need to be monitored:  Often, but not continuously (intermittently).  All the time or for long periods at a time (continuously). Continuous monitoring may be needed if: ? You are taking certain medicines, such as medicine to relieve pain or make your contractions stronger. ? You have pregnancy or labor complications. Monitoring may be done by:  Placing a special stethoscope or a handheld monitoring device on your abdomen to check your baby's heartbeat and to check for contractions.  Placing monitors on your abdomen (external monitors) to record your baby's heartbeat and the frequency and length of contractions.  Placing monitors inside your uterus through your vagina (internal monitors) to record your baby's heartbeat and the frequency, length, and strength of your contractions. Depending on the type of monitor, it may remain in your uterus or on your baby's head  until birth.  Telemetry. This is a type of continuous monitoring that can be done with external or internal monitors. Instead of having to stay in bed, you are able to move around during telemetry. Physical exam Your health care provider may perform frequent physical exams. This may include:  Checking how and where your baby is positioned in your uterus.  Checking your cervix to determine: ? Whether it is thinning out (effacing). ? Whether it is opening up (dilating). What happens during labor and delivery?  Normal labor and delivery is divided into the following three stages: Stage 1  This is the longest stage of labor.  This stage can last for hours or days.  Throughout this stage, you will feel contractions. Contractions generally feel mild, infrequent, and irregular at first. They get stronger, more frequent (about every 2-3 minutes), and more regular as you move through this stage.  This stage ends when your cervix is completely dilated to 4 inches (10 cm) and completely effaced. Stage 2  This stage starts once your cervix is completely effaced and dilated and lasts until the delivery of your baby.  This stage may last from 20 minutes to 2 hours.  This is the stage where you will feel an urge to push your baby out of your vagina.  You may feel stretching and burning pain, especially when the widest part of your baby's head passes through the vaginal opening (crowning).  Once your baby is delivered, the umbilical cord will be clamped and cut. This usually occurs after waiting a period of 1-2 minutes after delivery.  Your baby will be placed on your   bare chest (skin-to-skin contact) in an upright position and covered with a warm blanket. Watch your baby for feeding cues, like rooting or sucking, and help the baby to your breast for his or her first feeding. Stage 3  This stage starts immediately after the birth of your baby and ends after you deliver the placenta.  This  stage may take anywhere from 5 to 30 minutes.  After your baby has been delivered, you will feel contractions as your body expels the placenta and your uterus contracts to control bleeding. What can I expect after labor and delivery?  After labor is over, you and your baby will be monitored closely until you are ready to go home to ensure that you are both healthy. Your health care team will teach you how to care for yourself and your baby.  You and your baby will stay in the same room (rooming in) during your hospital stay. This will encourage early bonding and successful breastfeeding.  You may continue to receive fluids and medicines through an IV.  Your uterus will be checked and massaged regularly (fundal massage).  You will have some soreness and pain in your abdomen, vagina, and the area of skin between your vaginal opening and your anus (perineum).  If an incision was made near your vagina (episiotomy) or if you had some vaginal tearing during delivery, cold compresses may be placed on your episiotomy or your tear. This helps to reduce pain and swelling.  You may be given a squirt bottle to use instead of wiping when you go to the bathroom. To use the squirt bottle, follow these steps: ? Before you urinate, fill the squirt bottle with warm water. Do not use hot water. ? After you urinate, while you are sitting on the toilet, use the squirt bottle to rinse the area around your urethra and vaginal opening. This rinses away any urine and blood. ? Fill the squirt bottle with clean water every time you use the bathroom.  It is normal to have vaginal bleeding after delivery. Wear a sanitary pad for vaginal bleeding and discharge. Summary  Vaginal delivery means that you will give birth by pushing your baby out of your birth canal (vagina).  Your health care provider may monitor your contractions (uterine monitoring) and your baby's heart rate (fetal monitoring).  Your health care  provider may perform a physical exam.  Normal labor and delivery is divided into three stages.  After labor is over, you and your baby will be monitored closely until you are ready to go home. This information is not intended to replace advice given to you by your health care provider. Make sure you discuss any questions you have with your health care provider. Document Revised: 01/17/2018 Document Reviewed: 01/17/2018 Elsevier Patient Education  2020 [employer]  

## 2020-05-07 NOTE — MAU Note (Signed)
Patient had one episode of sudden onset left lower abdominal pain at 0300 when she got up to pee tonight.  States she hasn't felt this pain before.  Pain was 8-9/10 but has subsided, though still feels sore.  Denies CTX/LOF/VB or UTI S/Sx.  No abnormal discharge.  Endorses + FM.

## 2020-05-07 NOTE — MAU Provider Note (Signed)
Chief Complaint:  Abdominal Pain   First Provider Initiated Contact with Patient 05/07/20 0532     HPI: Kim Mejia is a 19 y.o. [name] at 22w5dwho presents to maternity admissions reporting sudden sharp pain in left lower abdomen.  Pain went away quickly with some residual soreness.  Has not felt that pain while here.  No leaking or bleeding at the time. Does not feel contractions much. . She reports good fetal movement, denies LOF, vaginal bleeding, vaginal itching/burning, urinary symptoms, h/a, dizziness, n/v, diarrhea, constipation or fever/chills.    Abdominal Pain This is a new problem. The current episode started today. The onset quality is sudden. The problem occurs rarely. The problem has been resolved. The pain is located in the LLQ. The quality of the pain is sharp. The abdominal pain does not radiate. Pertinent negatives include no constipation, diarrhea, dysuria, fever, frequency, headaches, myalgias, nausea or vomiting. Nothing aggravates the pain. The pain is relieved by nothing. She has tried nothing for the symptoms.    RN Note: Patient had one episode of sudden onset left lower abdominal pain at 0300 when she got up to pee tonight.  States she hasn't felt this pain before.  Pain was 8-9/10 but has subsided, though still feels sore.  Denies CTX/LOF/VB or UTI S/Sx.  No abnormal discharge.  Endorses + FM.  Past Medical History: Past Medical History:  Diagnosis Date  . 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 Closed fracture of distal fibula 03/10/2017   Right - broken when kicked during soccer game  . Obesity     Past obstetric history: OB History  Gravida Para Term Preterm AB Living  1 0          SAB TAB Ectopic Multiple Live Births               # Outcome Date GA Lbr Len/2nd Weight Sex Delivery Anes PTL Lv  1 Current              Past Surgical History: Past Surgical History:  Procedure Laterality Date  . ORIF FIBULA FRACTURE Right 03/14/2017   Procedure: RIGHT OPEN REDUCTION INTERNAL FIXATION LATERAL MALLEOLUS;  Surgeon: Marybelle Killings, MD;  Location: [facility];  Service: Orthopedics;  Laterality: Right;    Family History: Family History  Problem Relation Age of Onset  . Hyperlipidemia Mother     Social History: Social History   Tobacco Use  . Smoking status: Never Smoker  . Smokeless tobacco: Never Used  . Tobacco comment: father smokes, visits on weekends  Substance Use Topics  . Alcohol use: No    Alcohol/week: 0.0 standard drinks  . Drug use: No    Allergies: No Known Allergies  Meds:  Medications Prior to Admission  Medication Sig Dispense Refill Last Dose  . aspirin EC 81 MG tablet Take 1 tablet (81 mg total) by mouth daily. 30 tablet 4   . Blood Pressure Monitoring (BLOOD PRESSURE CUFF) MISC 1 Device by Does not apply route once a week. 1 each 0   . ferrous sulfate (FERROUSUL) 325 (65 FE) MG tablet Take 1 tablet (325 mg total) by mouth 2 (two) times daily. 60 tablet 1   . Prenatal Vit-Fe Fumarate-FA (MULTIVITAMIN-PRENATAL) 27-0.8 MG TABS tablet Take 1 tablet by mouth daily at 12 noon.       I have  reviewed patient's Past Medical Hx, Surgical Hx, Family Hx, Social Hx, medications and allergies.   ROS:  Review of Systems  Constitutional: Negative for fever.  Gastrointestinal: Positive for abdominal pain. Negative for constipation, diarrhea, nausea and vomiting.  Genitourinary: Negative for dysuria and frequency.  Musculoskeletal: Negative for myalgias.  Neurological: Negative for headaches.   Other systems negative  Physical Exam   Patient Vitals for the past 24 hrs:  BP Temp Pulse Resp Weight  05/07/20 0515 126/75 98.1 F (36.7 C) 71 18 117.7 kg   Constitutional: Well-developed, well-nourished female in no acute distress.  Cardiovascular: normal rate and rhythm Respiratory:  normal effort, clear to auscultation bilaterally GI: Abd soft, non-tender, gravid appropriate for gestational age.   No rebound or guarding.  No focal tenderness over area of concern at this time. Unable to reproduce pain. MS: Extremities nontender, no edema, normal ROM Neurologic: Alert and oriented x 4.  GU: Neg CVAT.  PELVIC EXAM: deferred  FHT:  Baseline 135 , moderate variability, accelerations present, no decelerations Contractions:  Irregular     Labs: Results for orders placed or performed during the hospital encounter of 05/07/20 (from the past 24 hour(s))  Urinalysis, Routine w reflex microscopic     Status: Abnormal   Collection Time: 05/07/20  5:59 AM  Result Value Ref Range   Color, Urine YELLOW YELLOW   APPearance CLEAR CLEAR   Specific Gravity, Urine 1.011 1.005 - 1.030   pH 6.0 5.0 - 8.0   Glucose, UA NEGATIVE NEGATIVE mg/dL   Hgb urine dipstick NEGATIVE NEGATIVE   Bilirubin Urine NEGATIVE NEGATIVE   Ketones, ur NEGATIVE NEGATIVE mg/dL   Protein, ur NEGATIVE NEGATIVE mg/dL   Nitrite NEGATIVE NEGATIVE   Leukocytes,Ua TRACE (A) NEGATIVE   RBC / HPF 0-5 0 - 5 RBC/hpf   WBC, UA 0-5 0 - 5 WBC/hpf   Bacteria, UA RARE (A) NONE SEEN   Squamous Epithelial / LPF 0-5 0 - 5    O/Positive/-- (10/16 1114)  Imaging:  No results found.  MAU Course/MDM: I have ordered labs and reviewed results. UA is clear NST reviewed and is reactive with occasional contractions  Treatments in MAU included EFM.    Assessment: Single IUP at [redacted]w[redacted]d SIngle sharp pain in left lower abdomen, likely round ligament pain Reactive nonstress test  Plan: Discharge home Labor precautions and fetal kick counts Follow up in Office for prenatal visits   Encouraged to return here or to other Urgent Care/ED if she develops worsening of symptoms, increase in pain, fever, or other concerning symptoms.   Pt stable at time of discharge.  Wynelle Bourgeois CNM, MSN Certified  Nurse-Midwife 05/07/2020 5:32 AM

## 2020-05-07 NOTE — Telephone Encounter (Signed)
Preadmission screen  

## 2020-05-09 ENCOUNTER — Encounter: Payer: Self-pay | Admitting: Obstetrics

## 2020-05-09 ENCOUNTER — Ambulatory Visit (INDEPENDENT_AMBULATORY_CARE_PROVIDER_SITE_OTHER): Payer: Medicaid Other | Admitting: Obstetrics

## 2020-05-09 ENCOUNTER — Other Ambulatory Visit: Payer: Self-pay

## 2020-05-09 ENCOUNTER — Other Ambulatory Visit: Payer: Self-pay | Admitting: Advanced Practice Midwife

## 2020-05-09 VITALS — BP 113/76 | HR 73 | Wt 259.0 lb

## 2020-05-09 DIAGNOSIS — Z3A40 40 weeks gestation of pregnancy: Secondary | ICD-10-CM

## 2020-05-09 DIAGNOSIS — Z3689 Encounter for other specified antenatal screening: Secondary | ICD-10-CM

## 2020-05-09 DIAGNOSIS — O99210 Obesity complicating pregnancy, unspecified trimester: Secondary | ICD-10-CM

## 2020-05-09 DIAGNOSIS — Z3480 Encounter for supervision of other normal pregnancy, unspecified trimester: Secondary | ICD-10-CM

## 2020-05-09 NOTE — Progress Notes (Signed)
Subjective:  Kim Mejia is a 19 y.o. [name] at [redacted]w[redacted]d being seen today for ongoing prenatal care.  She is currently monitored for the following issues for this low-risk pregnancy and has Pain of left calf; BMI (body mass index), pediatric, 95-99% for age; Language barrier; Supervision of other normal pregnancy, antepartum; UTI in pregnancy, antepartum, first trimester; Cystic fibrosis carrier, antepartum; Obesity in pregnancy; Anemia in pregnancy; and Group B Streptococcus carrier, +RV culture, currently pregnant on their problem list.  Patient reports no complaints.  Contractions: Not present. Vag. Bleeding: None.  Movement: Present. Denies leaking of fluid.   The following portions of the patient's history were reviewed and updated as appropriate: allergies, current medications, past family history, past medical history, past social history, past surgical history and problem list. Problem list updated.  Objective:   Vitals:   05/09/20 0917  BP: 113/76  Pulse: 73  Weight: 259 lb (117.5 kg)    Fetal Status:     Movement: Present     General:  Alert, oriented and cooperative. Patient is in no acute distress.  Skin: Skin is warm and dry. No rash noted.   Cardiovascular: Normal heart rate noted  Respiratory: Normal respiratory effort, no problems with respiration noted  Abdomen: Soft, gravid, appropriate for gestational age. Pain/Pressure: Absent     Pelvic:  Cervical exam performed      closed /50% / -3 / Vtx  Extremities: Normal range of motion.  Edema: Trace  Mental Status: Normal mood and affect. Normal behavior. Normal judgment and thought content.   Urinalysis:      Assessment and Plan:  Pregnancy: [name] at [redacted]w[redacted]d  1. Supervision of other normal pregnancy, antepartum  2. Obesity in pregnancy  3. NST (non-stress test) reactive   Term labor symptoms and general obstetric precautions including but not limited to vaginal bleeding, contractions, leaking of fluid and fetal movement  were reviewed in detail with the patient. Please refer to After Visit Summary for other counseling recommendations.   Return in about 4 weeks (around 06/06/2020) for postpartum visit.   Brock Bad, MD  05/09/2020

## 2020-05-09 NOTE — Progress Notes (Signed)
ROB  NST today 40 wks   CC: NST

## 2020-05-11 ENCOUNTER — Inpatient Hospital Stay (HOSPITAL_COMMUNITY): Payer: Medicaid Other | Admitting: Anesthesiology

## 2020-05-11 ENCOUNTER — Other Ambulatory Visit: Payer: Self-pay

## 2020-05-11 ENCOUNTER — Encounter (HOSPITAL_COMMUNITY): Payer: Self-pay | Admitting: Obstetrics & Gynecology

## 2020-05-11 ENCOUNTER — Inpatient Hospital Stay (HOSPITAL_COMMUNITY)
Admission: AD | Admit: 2020-05-11 | Discharge: 2020-05-14 | DRG: 807 | Disposition: A | Discharge status: Home / Self Care | Payer: Medicaid Other | Attending: Obstetrics and Gynecology | Admitting: Obstetrics and Gynecology

## 2020-05-11 DIAGNOSIS — Z141 Cystic fibrosis carrier: Secondary | ICD-10-CM

## 2020-05-11 DIAGNOSIS — Z20822 Contact with and (suspected) exposure to covid-19: Secondary | ICD-10-CM | POA: Diagnosis present

## 2020-05-11 DIAGNOSIS — O134 Gestational [pregnancy-induced] hypertension without significant proteinuria, complicating childbirth: Secondary | ICD-10-CM | POA: Diagnosis not present

## 2020-05-11 DIAGNOSIS — D649 Anemia, unspecified: Secondary | ICD-10-CM | POA: Diagnosis not present

## 2020-05-11 DIAGNOSIS — Z3480 Encounter for supervision of other normal pregnancy, unspecified trimester: Secondary | ICD-10-CM

## 2020-05-11 DIAGNOSIS — O480 Post-term pregnancy: Secondary | ICD-10-CM | POA: Diagnosis present

## 2020-05-11 DIAGNOSIS — O99019 Anemia complicating pregnancy, unspecified trimester: Secondary | ICD-10-CM

## 2020-05-11 DIAGNOSIS — O9902 Anemia complicating childbirth: Secondary | ICD-10-CM | POA: Diagnosis not present

## 2020-05-11 DIAGNOSIS — O09899 Supervision of other high risk pregnancies, unspecified trimester: Secondary | ICD-10-CM

## 2020-05-11 DIAGNOSIS — O139 Gestational [pregnancy-induced] hypertension without significant proteinuria, unspecified trimester: Secondary | ICD-10-CM

## 2020-05-11 DIAGNOSIS — Z3A40 40 weeks gestation of pregnancy: Secondary | ICD-10-CM | POA: Diagnosis not present

## 2020-05-11 DIAGNOSIS — O99210 Obesity complicating pregnancy, unspecified trimester: Secondary | ICD-10-CM

## 2020-05-11 DIAGNOSIS — O99824 Streptococcus B carrier state complicating childbirth: Secondary | ICD-10-CM | POA: Diagnosis present

## 2020-05-11 LAB — CBC
HCT: 34.4 % — ABNORMAL LOW (ref 36.0–46.0)
HCT: 39.6 % (ref 36.0–46.0)
Hemoglobin: 10.3 g/dL — ABNORMAL LOW (ref 12.0–15.0)
Hemoglobin: 12 g/dL (ref 12.0–15.0)
MCH: 25.2 pg — ABNORMAL LOW (ref 26.0–34.0)
MCH: 24.8 pg — ABNORMAL LOW (ref 26.0–34.0)
MCHC: 29.9 g/dL — ABNORMAL LOW (ref 30.0–36.0)
MCHC: 30.3 g/dL (ref 30.0–36.0)
MCV: 84.1 fL (ref 80.0–100.0)
MCV: 81.8 fL (ref 80.0–100.0)
Platelets: 255 10*3/uL (ref 150–400)
Platelets: 227 10*3/uL (ref 150–400)
RBC: 4.09 MIL/uL (ref 3.87–5.11)
RBC: 4.84 MIL/uL (ref 3.87–5.11)
RDW: 16.7 % — ABNORMAL HIGH (ref 11.5–15.5)
RDW: 17.1 % — ABNORMAL HIGH (ref 11.5–15.5)
WBC: 9.5 10*3/uL (ref 4.0–10.5)
WBC: 14.2 10*3/uL — ABNORMAL HIGH (ref 4.0–10.5)
nRBC: 0 % (ref 0.0–0.2)
nRBC: 0 % (ref 0.0–0.2)

## 2020-05-11 LAB — COMPREHENSIVE METABOLIC PANEL
ALT: 15 U/L (ref 0–44)
AST: 18 U/L (ref 15–41)
Albumin: 2.5 g/dL — ABNORMAL LOW (ref 3.5–5.0)
Alkaline Phosphatase: 179 U/L — ABNORMAL HIGH (ref 38–126)
Anion gap: 11 (ref 5–15)
BUN: 8 mg/dL (ref 6–20)
CO2: 20 mmol/L — ABNORMAL LOW (ref 22–32)
Calcium: 8.6 mg/dL — ABNORMAL LOW (ref 8.9–10.3)
Chloride: 106 mmol/L (ref 98–111)
Creatinine, Ser: 0.55 mg/dL (ref 0.44–1.00)
GFR calc Af Amer: >60 mL/min (ref >60)
GFR calc non Af Amer: >60 mL/min (ref >60)
Glucose, Bld: 86 mg/dL (ref 70–99)
Potassium: 3.6 mmol/L (ref 3.5–5.1)
Sodium: 137 mmol/L (ref 135–145)
Total Bilirubin: 0.4 mg/dL (ref 0.3–1.2)
Total Protein: 5.9 g/dL — ABNORMAL LOW (ref 6.5–8.1)

## 2020-05-11 LAB — TYPE AND SCREEN
ABO/RH(D): O POS
Antibody Screen: NEGATIVE

## 2020-05-11 LAB — PROTEIN / CREATININE RATIO, URINE
Creatinine, Urine: 80.76 mg/dL
Protein Creatinine Ratio: 0.14 mg/mg{Cre} (ref 0.00–0.15)
Total Protein, Urine: 11 mg/dL

## 2020-05-11 LAB — RPR: RPR Ser Ql: NONREACTIVE

## 2020-05-11 LAB — SARS CORONAVIRUS 2 (TAT 6-24 HRS): SARS Coronavirus 2: NEGATIVE

## 2020-05-11 MED ORDER — OXYTOCIN BOLUS FROM INFUSION
500.0000 mL | Freq: Once | INTRAVENOUS | Status: AC
  Administered 2020-05-12: 500 mL via INTRAVENOUS

## 2020-05-11 MED ORDER — SODIUM CHLORIDE 0.9 % IV SOLN
5.0000 10*6.[IU] | Freq: Once | INTRAVENOUS | Status: AC
  Administered 2020-05-11: 5 10*6.[IU] via INTRAVENOUS

## 2020-05-11 MED ORDER — OXYTOCIN 40 UNITS IN NORMAL SALINE INFUSION - SIMPLE MED
2.5000 [IU]/h | INTRAVENOUS | Status: DC
  Administered 2020-05-12: 2.5 [IU]/h via INTRAVENOUS

## 2020-05-11 MED ORDER — PHENYLEPHRINE 40 MCG/ML (10ML) SYRINGE FOR IV PUSH (FOR BLOOD PRESSURE SUPPORT): 80.0000 ug | PREFILLED_SYRINGE | INTRAVENOUS | Status: DC | PRN

## 2020-05-11 MED ORDER — OXYTOCIN 40 UNITS IN NORMAL SALINE INFUSION - SIMPLE MED
1.0000 m[IU]/min | INTRAVENOUS | Status: DC
  Administered 2020-05-11: 4 m[IU]/min via INTRAVENOUS
  Administered 2020-05-11 – 2020-05-12 (×2): 2 m[IU]/min via INTRAVENOUS
  Administered 2020-05-12: 6 m[IU]/min via INTRAVENOUS
  Administered 2020-05-12: 4 m[IU]/min via INTRAVENOUS
  Filled 2020-05-11: qty 1000

## 2020-05-11 MED ORDER — ACETAMINOPHEN 325 MG PO TABS: 650.0000 mg | ORAL_TABLET | ORAL | Status: DC | PRN

## 2020-05-11 MED ORDER — FENTANYL-BUPIVACAINE-NACL 0.5-0.125-0.9 MG/250ML-% EP SOLN
12.0000 mL/h | EPIDURAL | Status: DC | PRN
  Filled 2020-05-11: qty 250

## 2020-05-11 MED ORDER — FENTANYL CITRATE (PF) 100 MCG/2ML IJ SOLN
50.0000 ug | INTRAMUSCULAR | Status: DC | PRN
  Filled 2020-05-11: qty 2

## 2020-05-11 MED ORDER — PENICILLIN G POT IN DEXTROSE 60000 UNIT/ML IV SOLN
3.0000 10*6.[IU] | INTRAVENOUS | Status: DC
  Administered 2020-05-11 – 2020-05-12 (×4): 3 10*6.[IU] via INTRAVENOUS
  Filled 2020-05-11 (×4): qty 50

## 2020-05-11 MED ORDER — LACTATED RINGERS AMNIOINFUSION: INTRAVENOUS | Status: DC

## 2020-05-11 MED ORDER — TERBUTALINE SULFATE 1 MG/ML IJ SOLN: 0.2500 mg | Freq: Once | INTRAMUSCULAR | Status: DC | PRN

## 2020-05-11 MED ORDER — SOD CITRATE-CITRIC ACID 500-334 MG/5ML PO SOLN: 30.0000 mL | ORAL | Status: DC | PRN

## 2020-05-11 MED ORDER — EPHEDRINE 5 MG/ML INJ: 10.0000 mg | INTRAVENOUS | Status: DC | PRN

## 2020-05-11 MED ORDER — SODIUM CHLORIDE (PF) 0.9 % IJ SOLN
INTRAMUSCULAR | Status: DC | PRN
  Administered 2020-05-11: 11 mL/h via EPIDURAL

## 2020-05-11 MED ORDER — DIPHENHYDRAMINE HCL 50 MG/ML IJ SOLN: 12.5000 mg | INTRAMUSCULAR | Status: DC | PRN

## 2020-05-11 MED ORDER — MISOPROSTOL 50MCG HALF TABLET: 50.0000 ug | ORAL_TABLET | Freq: Once | ORAL | Status: DC

## 2020-05-11 MED ORDER — MISOPROSTOL 25 MCG QUARTER TABLET
25.0000 ug | ORAL_TABLET | ORAL | Status: DC | PRN
  Filled 2020-05-11: qty 1

## 2020-05-11 MED ORDER — LACTATED RINGERS IV SOLN
500.0000 mL | Freq: Once | INTRAVENOUS | Status: AC
  Administered 2020-05-11: 500 mL via INTRAVENOUS

## 2020-05-11 MED ORDER — FENTANYL CITRATE (PF) 100 MCG/2ML IJ SOLN
100.0000 ug | INTRAMUSCULAR | Status: DC | PRN
  Administered 2020-05-11 (×2): 100 ug via INTRAVENOUS
  Filled 2020-05-11: qty 2

## 2020-05-11 MED ORDER — MISOPROSTOL 50MCG HALF TABLET
50.0000 ug | ORAL_TABLET | Freq: Once | ORAL | Status: AC
  Administered 2020-05-11: 50 ug via ORAL
  Filled 2020-05-11: qty 1

## 2020-05-11 MED ORDER — ONDANSETRON HCL 4 MG/2ML IJ SOLN: 4.0000 mg | Freq: Four times a day (QID) | INTRAMUSCULAR | Status: DC | PRN

## 2020-05-11 MED ORDER — PENICILLIN G POT IN DEXTROSE 60000 UNIT/ML IV SOLN: 3.0000 10*6.[IU] | INTRAVENOUS | Status: DC

## 2020-05-11 MED ORDER — LACTATED RINGERS IV SOLN: 500.0000 mL | INTRAVENOUS | Status: DC | PRN

## 2020-05-11 MED ORDER — LACTATED RINGERS IV SOLN: INTRAVENOUS | Status: DC

## 2020-05-11 MED ORDER — SODIUM CHLORIDE 0.9 % IV SOLN
5.0000 10*6.[IU] | Freq: Once | INTRAVENOUS | Status: DC
  Filled 2020-05-11: qty 5

## 2020-05-11 MED ORDER — MISOPROSTOL 50MCG HALF TABLET
50.0000 ug | ORAL_TABLET | ORAL | Status: DC
  Administered 2020-05-11: 50 ug via BUCCAL
  Filled 2020-05-11: qty 1

## 2020-05-11 MED ORDER — LIDOCAINE HCL (PF) 1 % IJ SOLN: 30.0000 mL | INTRAMUSCULAR | Status: DC | PRN

## 2020-05-11 MED ORDER — LIDOCAINE HCL (PF) 1 % IJ SOLN
INTRAMUSCULAR | Status: DC | PRN
  Administered 2020-05-11 (×2): 4 mL via EPIDURAL

## 2020-05-11 NOTE — Progress Notes (Signed)
CNM at bedside for FHR deceleration. Pi\tocin turned down in half, position changed and pulse ox applied. FHR returned to baseline. Will observe and hold pitocin for now.   Rolm Bookbinder, CNM 05/11/20 11:30 PM

## 2020-05-11 NOTE — H&P (Addendum)
Kim Mejia is a 19 y.o. female, [name] at 40.2 weeks, presenting for contractions and was subsequently admitted for IOL s/t NRNST and Elevated BPs.  Patient receives care at [facility] and was supervised for a low risk pregnancy. Pregnancy and medical history significant for problems as listed below. She is GBS positive and expresses a desire for no interventions for pain management.  She is anticipating a female infant for outpatient circumcision.  She desires outpatient IUD for PP birth control method.     Patient Active Problem List   Diagnosis Date Noted  . Group B Streptococcus carrier, +RV culture, currently pregnant 04/20/2020  . Anemia in pregnancy 02/11/2020  . Obesity in pregnancy 12/04/2019  . 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 UTI in pregnancy, antepartum, first trimester 10/15/2019  . Supervision of other normal pregnancy, antepartum 10/02/2019  . BMI (body mass index), pediatric, 95-99% for age 53/25/2016  . Language barrier 03/21/2015  . Pain of left calf 08/21/2013    History of present pregnancy:  Last evaluation:  In office May 09, 2020.  VE: Closed/50/-3/Vtx BP: 113/76  Pulse: 73  Weight: 259 lb (117.5 kg)     Nursing Staff Provider  Office Location  [facility] Dating  LMP  Language  English Anatomy US  normal  Flu Vaccine   declined 10/12/19 Genetic Screen  NIPS: low risks   TDaP vaccine   declined 02/05/2020 Hgb A1C or  GTT Early A1C 4.8% Third trimester 82/121/85 (passed)  Rhogam  N/A   LAB RESULTS   Feeding Plan Both Blood Type O/Positive/-- (10/16 1114)   Contraception  IUD at [facility] visit Antibody Negative (10/16 1114)  Circumcision Yes Rubella 2.09 (10/16 1114)  Pediatrician  Undecided (04/22/20)  Non Reactive (02/09 0909)   Support Person   Negative (10/16 1114)   Prenatal Classes   Non Reactive (02/09 0909)  BTL Consent   Positive/-- (04/20 1034)(For PCN allergy, check sensitivities)   VBAC Consent  Pap <21    Hgb Electro  neg Horizon  BP  Cuff  ordered 10/02/19 CF carrier    SMA neg Horizon    Waterbirth  [ ]  Class [ ]  Consent [ ]  CNM visit     OB History    Gravida  1   Para  0   Term      Preterm      AB      Living        SAB      TAB      Ectopic      Multiple      Live Births                Past Medical History:  Diagnosis Date  . 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 Closed fracture of distal fibula 03/10/2017   Right - broken when kicked during soccer game  . Obesity    Past Surgical History:  Procedure Laterality Date  . ORIF FIBULA FRACTURE Right 03/14/2017   Procedure: RIGHT OPEN REDUCTION INTERNAL FIXATION LATERAL MALLEOLUS;  Surgeon: 04/11/2015, MD;  Location: [facility];  Service: Orthopedics;  Laterality: Right;   Family History: family history includes Hyperlipidemia in her mother. Social History:  reports that she has never smoked. She has never used smokeless tobacco. She reports that she does not drink alcohol or use  drugs.   Prenatal Transfer Tool  Maternal Diabetes: No Genetic Screening: Normal Fetal, Maternal MAAC-CF Carrier Maternal Ultrasounds/Referrals: Normal Fetal Ultrasounds or other Referrals:  None Maternal Substance Abuse:  No Significant Maternal Medications:  Meds include: Other: Aspirin, Iron, PNV Significant Maternal Lab Results: Group B Strep positive   Maternal Assessment:  ROS: +Contractions, -LOF, -Vaginal Bleeding, +Fetal Movement  All other systems reviewed and negative.    No Known Allergies   Dilation: Fingertip Effacement (%): Thick Exam by:: Glena Norfolk, RN Blood pressure (!) 140/93, pulse 69, temperature 97.8 F (36.6 C), temperature source Oral, resp. rate 18, weight 120 kg, last menstrual period 07/31/2019, SpO2 98 %.    Physical Exam  Constitutional: She is oriented to  person, place, and time. She appears well-developed and well-nourished.  Obese  HENT:  Head: Normocephalic and atraumatic.  Eyes: Conjunctivae are normal.  Cardiovascular: Normal rate, regular rhythm and normal heart sounds.  Respiratory: Effort normal and breath sounds normal.  GI: Soft. There is no abdominal tenderness.  Gravid--fundal height appears LGA, Soft, NT   Musculoskeletal:        General: Normal range of motion.     Cervical back: Normal range of motion.  Neurological: She is alert and oriented to person, place, and time.  Skin: Skin is warm and dry.  Psychiatric: She has a normal mood and affect. Her behavior is normal.    Fetal Assessment: Leopolds: -Pelvis: Not evaluated -EFW: 7 1/2 lbs -Presentation: Vertex by Leopolds  FHR: 135 bpm, Mod Var, -Decels, +Accels UCs:  Irregular    Assessment IUP at 40.2 weeks Cat I FT Elevated BP NR NST   Plan: Admit to [facility]  Routine Labor and Delivery Orders per Protocol In room to complete assessment and discuss POC: -Discussed r/b of induction including fetal distress, serial induction, pain, and increased risk of c/s delivery -Discussed induction methods including cervical ripening agents, foley bulbs, and pitocin -Patient verbalizes understanding and has no questions or concerns. -Will start induction with 64mcg cytotec PO -Will obtain labs for evaluation of Preeclampsia    Loann Quill, MSN 05/11/2020, 3:52 AM

## 2020-05-11 NOTE — MAU Note (Addendum)
Pt reports to MAU for contractions, denies VB and LOF,+fetal movement.

## 2020-05-11 NOTE — Anesthesia Preprocedure Evaluation (Signed)
Anesthesia Evaluation  Patient identified by MRN, date of birth, ID band Patient awake    Reviewed: Allergy & Precautions, Patient's Chart, lab work & pertinent test results  Airway Mallampati: III  TM Distance: >3 FB Neck ROM: Full    Dental no notable dental hx. (+) Teeth Intact   Pulmonary neg pulmonary ROS,    Pulmonary exam normal breath sounds clear to auscultation       Cardiovascular negative cardio ROS Normal cardiovascular exam Rhythm:Regular Rate:Normal     Neuro/Psych negative neurological ROS  negative psych ROS   GI/Hepatic Neg liver ROS, GERD  ,  Endo/Other  Morbid obesity  Renal/GU negative Renal ROS  negative genitourinary   Musculoskeletal negative musculoskeletal ROS (+)   Abdominal (+) + obese,   Peds  Hematology  (+) anemia ,   Anesthesia Other Findings   Reproductive/Obstetrics (+) Pregnancy                             Anesthesia Physical Anesthesia Plan  ASA: III  Anesthesia Plan: Epidural   Post-op Pain Management:    Induction:   PONV Risk Score and Plan:   Airway Management Planned: Natural Airway  Additional Equipment:   Intra-op Plan:   Post-operative Plan:   Informed Consent: I have reviewed the patients History and Physical, chart, labs and discussed the procedure including the risks, benefits and alternatives for the proposed anesthesia with the patient or authorized representative who has indicated his/her understanding and acceptance.       Plan Discussed with: Anesthesiologist  Anesthesia Plan Comments:         Anesthesia Quick Evaluation  

## 2020-05-11 NOTE — Anesthesia Procedure Notes (Signed)
Epidural Patient location during procedure: OB Start time: 05/11/2020 7:25 PM End time: 05/11/2020 7:32 PM  Staffing Anesthesiologist: Mal Amabile, MD Performed: anesthesiologist   Preanesthetic Checklist Completed: patient identified, IV checked, site marked, risks and benefits discussed, surgical consent, monitors and equipment checked, pre-op evaluation and timeout performed  Epidural Patient position: sitting Prep: DuraPrep and site prepped and draped Patient monitoring: continuous pulse ox and blood pressure Approach: midline Location: L3-L4 Injection technique: LOR air  Needle:  Needle type: Tuohy  Needle gauge: 17 G Needle length: 9 cm and 9 Needle insertion depth: 8 cm Catheter type: closed end flexible Catheter size: 19 Gauge Catheter at skin depth: 13 cm Test dose: negative and Other  Assessment Events: blood not aspirated, injection not painful, no injection resistance, no paresthesia and negative IV test  Additional Notes Patient identified. Risks and benefits discussed including failed block, incomplete  Pain control, post dural puncture headache, nerve damage, paralysis, blood pressure Changes, nausea, vomiting, reactions to medications-both toxic and allergic and post Partum back pain. All questions were answered. Patient expressed understanding and wished to proceed. Sterile technique was used throughout procedure. Epidural site was Dressed with sterile barrier dressing. No paresthesias, signs of intravascular injection Or signs of intrathecal spread were encountered.  Patient was more comfortable after the epidural was dosed. Please see RN's note for documentation of vital signs and FHR which are stable. Reason for block:procedure for pain

## 2020-05-11 NOTE — Progress Notes (Signed)
Labor Progress Note Dacoda Torresis a 18 y.o.[name] at [redacted]w[redacted]d presented for IOL for gHTN.  S:Patient reports contractions.  Amenable to AROM at this time.  O: BP 121/70   Pulse 67   Temp 98.7 F (37.1 C) (Oral)   Resp 17   Wt 120 kg   LMP 07/31/2019 (Approximate)   SpO2 99%   BMI 47.61 kg/m  EFM: 145bpm, moderate variability, + accels, 2 late decels that resolved with position change  Cervical Exam: 6/80%/0 11:01 PM   A&P:18 y.o.[name] [redacted]w[redacted]d here for IOL for gHTN.  #Labor:AROM, thick meconium noted.  Amnioinfusion started, 300cc bolus then 125cc/hr. #Pain:Epidural in place #FWB: categoryII #GBS positivePNC #elevated BP:no concerning labs. Will continue to monitor.  Luis Abed, D.O.  PGY-2 Family Medicine  05/11/2020 11:02 PM

## 2020-05-11 NOTE — Progress Notes (Signed)
Labor Progress Note Kim Mejia is a 19 y.o. [name] at [redacted]w[redacted]d presented for IOL for gHTN.  S: Feeling tightening after epidural.  Would like to hold off on AROM at this time.  O:  BP 121/70   Pulse 67   Temp 98.7 F (37.1 C) (Oral)   Resp 17   Wt 120 kg   LMP 07/31/2019 (Approximate)   SpO2 99%   BMI 47.61 kg/m  EFM: 140/moderate/pos accels, no decels  Dilation: 6 Effacement (%): 80 Cervical Position: Posterior Station: -1 Presentation: Vertex Exam by:: Devoria Albe, RN    A&P: 19 y.o. [name] [redacted]w[redacted]d here for IOL for gHTN.  #Labor: Progressing well on pit.  Discussed AROM, patient would like to hold off for now which is acceptable. #Pain: Fentanyl Q 1 hour PRN. Epidural in place #FWB: category I #GBS positive PNC #elevated BP: no concerning labs. Will continue to monitor.  Luis Abed, D.O.  PGY-2 Family Medicine  05/11/2020 8:55 PM

## 2020-05-11 NOTE — Progress Notes (Addendum)
Labor Progress Note [name] is a 19 y.o. [name] at [redacted]w[redacted]d presented for IOL for gHTN.  S: Doing well. Mild back pain with contractions so far. No N/V.  O:  BP 116/77   Pulse 65   Temp 98 F (36.7 C) (Oral)   Resp 20   Wt 120 kg   LMP 07/31/2019 (Approximate)   SpO2 98%   BMI 47.61 kg/m  EFM: 150/moderate var/accels, no decels  CVE: Dilation: 1.5 Effacement (%): 30 Cervical Position: Posterior Station: -3 Presentation: Vertex Exam by:: Dr. Homero Fellers   A&P: 19 y.o. [name] [redacted]w[redacted]d here for IOL for gHTN.  #Labor: Progressing well. Some progress with oral cytotec.  Foley bulb placed. Second cytotec given. #Pain: Trying to avoid medication. Epidural upon request. #FWB: category I #GBS positive start PNC (chronic/other problems) #elevated BP: follow up [facility] labs. Monitor vitals  [name], MD 9:52 AM

## 2020-05-11 NOTE — Progress Notes (Addendum)
Labor Progress Note Brittannie Tawney is a 19 y.o. [name] at [redacted]w[redacted]d presented for IOL for gHTN.  S: More painful contractions.  Now would like to move forward with some pain medication. Hold off on epidural for now.  O:  BP 127/82   Pulse 72   Temp 98.2 F (36.8 C) (Oral)   Resp 20   Wt 120 kg   LMP 07/31/2019 (Approximate)   SpO2 98%   BMI 47.61 kg/m  EFM: 150/moderate/pos accels, no decels  CVE: Dilation: 5 Effacement (%): 70 Cervical Position: Posterior Station: -1 Presentation: Vertex Exam by:: Dr. Homero Fellers   A&P: 19 y.o. [name] [redacted]w[redacted]d here for IOL for gHTN.  #Labor: progressing well. FB out. Slow progression on pit.  Hold off on AROM for now as she is a prime and may have many hours of labor ahead.   #Pain: Fentanyl Q 1 hour PRN. Epidural upon request #FWB: category I #GBS positive PNC #elevated BP: no concerning labs. Will continue to monitor.  [name], MD 2:10 PM

## 2020-05-12 ENCOUNTER — Encounter (HOSPITAL_COMMUNITY): Payer: Self-pay | Admitting: Obstetrics & Gynecology

## 2020-05-12 DIAGNOSIS — O99824 Streptococcus B carrier state complicating childbirth: Secondary | ICD-10-CM

## 2020-05-12 DIAGNOSIS — Z3A40 40 weeks gestation of pregnancy: Secondary | ICD-10-CM

## 2020-05-12 DIAGNOSIS — O134 Gestational [pregnancy-induced] hypertension without significant proteinuria, complicating childbirth: Principal | ICD-10-CM

## 2020-05-12 LAB — CBC
HCT: 30.5 % — ABNORMAL LOW (ref 36.0–46.0)
Hemoglobin: 9.1 g/dL — ABNORMAL LOW (ref 12.0–15.0)
MCH: 24.5 pg — ABNORMAL LOW (ref 26.0–34.0)
MCHC: 29.8 g/dL — ABNORMAL LOW (ref 30.0–36.0)
MCV: 82 fL (ref 80.0–100.0)
Platelets: 206 10*3/uL (ref 150–400)
RBC: 3.72 MIL/uL — ABNORMAL LOW (ref 3.87–5.11)
RDW: 16.8 % — ABNORMAL HIGH (ref 11.5–15.5)
WBC: 16.5 10*3/uL — ABNORMAL HIGH (ref 4.0–10.5)
nRBC: 0 % (ref 0.0–0.2)

## 2020-05-12 MED ORDER — TETANUS-DIPHTH-ACELL PERTUSSIS 5-2.5-18.5 LF-MCG/0.5 IM SUSP
0.5000 mL | Freq: Once | INTRAMUSCULAR | Status: AC
  Administered 2020-05-14: 0.5 mL via INTRAMUSCULAR
  Filled 2020-05-12: qty 0.5

## 2020-05-12 MED ORDER — COCONUT OIL OIL: 1.0000 "application " | TOPICAL_OIL | Status: DC | PRN

## 2020-05-12 MED ORDER — IBUPROFEN 600 MG PO TABS
600.0000 mg | ORAL_TABLET | Freq: Four times a day (QID) | ORAL | Status: DC
  Administered 2020-05-12 – 2020-05-14 (×9): 600 mg via ORAL
  Filled 2020-05-12 (×9): qty 1

## 2020-05-12 MED ORDER — ONDANSETRON HCL 4 MG/2ML IJ SOLN: 4.0000 mg | INTRAMUSCULAR | Status: DC | PRN

## 2020-05-12 MED ORDER — ONDANSETRON HCL 4 MG PO TABS: 4.0000 mg | ORAL_TABLET | ORAL | Status: DC | PRN

## 2020-05-12 MED ORDER — PRENATAL MULTIVITAMIN CH
1.0000 | ORAL_TABLET | Freq: Every day | ORAL | Status: DC
  Administered 2020-05-12 – 2020-05-14 (×3): 1 via ORAL
  Filled 2020-05-12 (×3): qty 1

## 2020-05-12 MED ORDER — ZOLPIDEM TARTRATE 5 MG PO TABS: 5.0000 mg | ORAL_TABLET | Freq: Every evening | ORAL | Status: DC | PRN

## 2020-05-12 MED ORDER — SENNOSIDES-DOCUSATE SODIUM 8.6-50 MG PO TABS
2.0000 | ORAL_TABLET | ORAL | Status: DC
  Administered 2020-05-13 (×2): 2 via ORAL
  Filled 2020-05-12 (×2): qty 2

## 2020-05-12 MED ORDER — SIMETHICONE 80 MG PO CHEW: 80.0000 mg | CHEWABLE_TABLET | ORAL | Status: DC | PRN

## 2020-05-12 MED ORDER — DIPHENHYDRAMINE HCL 25 MG PO CAPS: 25.0000 mg | ORAL_CAPSULE | Freq: Four times a day (QID) | ORAL | Status: DC | PRN

## 2020-05-12 MED ORDER — WITCH HAZEL-GLYCERIN EX PADS: 1.0000 "application " | MEDICATED_PAD | CUTANEOUS | Status: DC | PRN

## 2020-05-12 MED ORDER — ACETAMINOPHEN 325 MG PO TABS
650.0000 mg | ORAL_TABLET | ORAL | Status: DC | PRN
  Administered 2020-05-12: 650 mg via ORAL
  Filled 2020-05-12: qty 2

## 2020-05-12 MED ORDER — DIBUCAINE (PERIANAL) 1 % EX OINT: 1.0000 "application " | TOPICAL_OINTMENT | CUTANEOUS | Status: DC | PRN

## 2020-05-12 MED ORDER — BENZOCAINE-MENTHOL 20-0.5 % EX AERO
1.0000 "application " | INHALATION_SPRAY | CUTANEOUS | Status: DC | PRN
  Administered 2020-05-12: 1 via TOPICAL
  Filled 2020-05-12: qty 56

## 2020-05-12 NOTE — Lactation Note (Addendum)
This note was copied from a baby's chart. Lactation Consultation Note Baby 16 hrs old. Mom stated baby has been latching well. Mom has flat compressible nipples. Hand expression w/colostrum pouring out. Mom stated she has been leaking since she found out she was pregnant. Mom has "V" shaped breast. Positioning, props, support, STS, I&O, breast massage, milk storage, newborn feeding habits, supply and demand discussed.  Hand expression taught. Collected 3 ml spoon fed baby. Placed baby in football hold. Baby latched well. Nipples being flat, needed to use t-cup hold to latch. Baby was able to maintain latch and hold nipple in mouth for feeding. Good breast compressions noted. Intermittent swallows noted. Noted softening of breast after feeding for a while. Encouraged mom to assess for transfer.  Gave mom shells to wear in am, hand pump for pre-pumping if needed or to collect colostrum to give back to baby.  I don't think mom is a good candidate for NS. Hoping to keep breast compressible for latching.  Encouraged mom to call for assistance if needed. Lactation brochure given.  Patient Name: Kim Mejia [id number] Date: 05/12/2020 Reason for consult: Initial assessment;Primapara;Term   Maternal Data Has patient been taught Hand Expression?: Yes Does the patient have breastfeeding experience prior to this delivery?: No  Feeding Feeding Type: Breast Milk  LATCH Score Latch: Grasps breast easily, tongue down, lips flanged, rhythmical sucking.  Audible Swallowing: A few with stimulation  Type of Nipple: Flat  Comfort (Breast/Nipple): Soft / non-tender  Hold (Positioning): Assistance needed to correctly position infant at breast and maintain latch.  LATCH Score: 7  Interventions Interventions: Breast feeding basics reviewed;Assisted with latch;Position options;Support pillows;Skin to skin;Expressed milk;Breast massage;Hand express;Shells;Pre-pump if needed;Breast  compression;Adjust position;Hand pump  Lactation Tools Discussed/Used Tools: Shells;Pump Shell Type: Inverted Breast pump type: Manual WIC Program: Yes Pump Review: Setup, frequency, and cleaning;Milk Storage Initiated by:: Peri Jefferson RN IBCLC Date initiated:: 05/12/20   Consult Status Consult Status: Follow-up Date: 05/13/20 Follow-up type: In-patient    Charyl Dancer 05/12/2020, 11:27 PM

## 2020-05-12 NOTE — Progress Notes (Signed)
Labor Progress Note Eevee Borbon is a 19 y.o. [name] at [redacted]w[redacted]d presented for  IOL for gHTN  S: Reports feeling well.  Contractions have decreased with decrease in pitocin.  O:  BP 130/65   Pulse 72   Temp 98.7 F (37.1 C) (Oral)   Resp 16   Ht 5\' 2"  (1.575 m)   Wt 120 kg   LMP 07/31/2019 (Approximate)   SpO2 95%   BMI 48.38 kg/m  EFM: 140/moderate variability/+accels, 1 late decel that resolved with position change  CVE: Dilation: Lip/rim Effacement (%): 100 Cervical Position: Posterior Station: Plus 1 Presentation: Vertex Exam by:: [email or web address], CNM    A&P: 19 y.o. [name] [redacted]w[redacted]d here for IOL for gHTN #Labor: Progressing well. Amnioinfusion running.  Hold pitocin at 4, plan to sit patient up given anterior lip and will turn pitocin up if strip remains reactive. #Pain: Epidural in place #FWB: category II #GBS positive, PCN #elevated BP:no concerning labs. Will continue to monitor.  [redacted]w[redacted]d Regie Bunner, DO 2:49 AM

## 2020-05-12 NOTE — Discharge Summary (Addendum)
Postpartum Discharge Summary      Patient Name: Kim Mejia DOB: [birth year] MRN: [id number]  Date of admission: 05/11/2020 Delivery date:05/12/2020  Delivering provider: Wende Mott  Date of discharge: 05/14/2020  Admitting diagnosis: Indication for care or intervention in labor or delivery [O75.9] Intrauterine pregnancy: [redacted]w[redacted]d    Secondary diagnosis:  Active Problems:   Indication for care or intervention in labor or delivery   Gestational hypertension  Additional problems: None    Discharge diagnosis: Term Pregnancy Delivered and Gestational Hypertension                                              Post partum procedures:none Augmentation: AROM, Pitocin, Cytotec and IP Foley Complications: None  Hospital course: Induction of Labor With Vaginal Delivery   19y.o. yo [name] at 444w3das admitted to the hospital 05/11/2020 for induction of labor.  Indication for induction: Gestational hypertension.  Patient had an uncomplicated labor course as follows: Membrane Rupture Time/Date: 10:48 PM ,05/11/2020   Delivery Method:Vaginal, Spontaneous  Episiotomy: None  Lacerations:  2nd degree  Details of delivery can be found in separate delivery note.  Patient had a routine postpartum course. BP's were borderline for which she was started on Enalapril 82m81mStarted on PO iron for anemia, hgb 8.5 at time of discharge. Patient is discharged home 05/14/20.  Newborn Data: Birth date:05/12/2020  Birth time:7:20 AM  Gender:Female  Living status:Living  Apgars:9 ,9   Magnesium Sulfate received: No BMZ received: No Rhophylac:N/A MMR:No T-DaP:declined Flu: No Transfusion:No  Physical exam  Vitals:   05/13/20 1239 05/13/20 1517 05/13/20 2224 05/14/20 0527  BP: 123/79 127/77 118/77 110/78  Pulse: 72 80 72 63  Resp: _0 Temp:  98.7 F (37.1 C) 97.9 F (36.6 C) 98 F (36.7 C)  TempSrc:  Oral Oral Oral  SpO2:      Weight:      Height:       General: alert,  cooperative and no distress Lochia: appropriate Uterine Fundus: firm Incision: N/A DVT Evaluation: No evidence of DVT seen on physical exam. Labs: Lab Results  Component Value Date   WBC 12.9 (H) 05/13/2020   HGB 8.5 (L) 05/13/2020   HCT 28.1 (L) 05/13/2020   MCV 83.4 05/13/2020   PLT 197 05/13/2020   CMP Latest Ref Rng & Units 05/11/2020  Glucose 70 - 99 mg/dL 86  BUN 6 - 20 mg/dL 8  Creatinine 0.44 - 1.00 mg/dL 0.55  Sodium 135 - 145 mmol/L 137  Potassium 3.5 - 5.1 mmol/L 3.6  Chloride 98 - 111 mmol/L 106  CO2 22 - 32 mmol/L 20(L)  Calcium 8.9 - 10.3 mg/dL 8.6(L)  Total Protein 6.5 - 8.1 g/dL 5.9(L)  Total Bilirubin 0.3 - 1.2 mg/dL 0.4  Alkaline Phos 38 - 126 U/L 179(H)  AST 15 - 41 U/L 18  ALT 0 - 44 U/L 15   Edinburgh Score: Edinburgh Postnatal Depression Scale Screening Tool 05/12/2020  I have been able to laugh and see the funny side of things. 0  I have looked forward with enjoyment to things. 0  I have blamed myself unnecessarily when things went wrong. 1  I have been anxious or worried for no good reason. 1  I have felt scared or panicky for no good reason. 0  Things have been getting  on top of me. 1  I have been so unhappy that I have had difficulty sleeping. 0  I have felt sad or miserable. 0  I have been so unhappy that I have been crying. 0  The thought of harming myself has occurred to me. 0  Edinburgh Postnatal Depression Scale Total 3     After visit meds:  Allergies as of 05/14/2020   No Known Allergies     Medication List    STOP taking these medications   aspirin EC 81 MG tablet     TAKE these medications   Blood Pressure Cuff Misc 1 Device by Does not apply route once a week.   enalapril 5 MG tablet Commonly known as: VASOTEC Take 1 tablet (5 mg total) by mouth daily.   ferrous sulfate 325 (65 FE) MG tablet Take 1 tablet (325 mg total) by mouth daily with breakfast. What changed: when to take this   ibuprofen 600 MG tablet Commonly  known as: ADVIL Take 1 tablet (600 mg total) by mouth every 6 (six) hours.   multivitamin-prenatal 27-0.8 MG Tabs tablet Take 1 tablet by mouth daily at 12 noon.        Discharge home in stable condition Infant Feeding: Breast Infant Disposition:home with mother Discharge instruction: per After Visit Summary and Postpartum booklet. Activity: Advance as tolerated. Pelvic rest for 6 weeks.  Diet: routine diet Future Appointments: No future appointments. Follow up Visit:  Please schedule this patient for Postpartum visit in: 4 weeks with the following provider: Any provider In-Person For C/S patients schedule nurse incision check in weeks 2 weeks: no Low risk pregnancy complicated by: gHTN in labor  HTN: discharged home on enalapril 15m  Delivery mode:  SVD Anticipated Birth Control:  IUD PP Procedures needed: BP check  Schedule Integrated BH visit: no  BArizona Constable D.O.  PGY-2 Family Medicine  05/14/2020 6:00 AM   OB FELLOW ATTESTATION  I have seen and examined this patient and edited the above documentation in the resident's note to reflect any changes or updates.  MAugustin Coupe MD/[facility] OB Fellow  05/14/2020, 7:24 AM

## 2020-05-12 NOTE — Progress Notes (Addendum)
Labor Progress Note Kim Mejia is a 19 y.o. [name] at [redacted]w[redacted]d presented for IOL for gHTN S: No complaints, still having contractions, epidural controlling pain.  O:  BP (!) 113/57   Pulse 74   Temp 98.7 F (37.1 C) (Oral)   Resp 18   Ht 5\' 2"  (1.575 m)   Wt 120 kg   LMP 07/31/2019 (Approximate)   SpO2 95%   BMI 48.38 kg/m  EFM: 140/moderate variability/+ accels, 1 late decel that resolved with position change  CVE: 8/90/+1  A&P: 19 y.o. [name] [redacted]w[redacted]d here for IOL for gHTN #Labor: Progressing well. Amnioinfusion running.  Hold pitocin at 4 given previous decels. #Pain: Epidural in place #FWB: category II #GBS positive #elevated BP:no concerning labs. Will continue to monitor.  [redacted]w[redacted]d Zarah Carbon, DO 12:24 AM

## 2020-05-12 NOTE — Progress Notes (Signed)
Labor Progress Note Kim Mejia is a 19 y.o. [name] at [redacted]w[redacted]d presented for IOL for gHTN  S: Mother reports feeling strong contractions.  O:  BP 127/76   Pulse 66   Temp 98.5 F (36.9 C) (Oral)   Resp 16   Ht 5\' 2"  (1.575 m)   Wt 120 kg   LMP 07/31/2019 (Approximate)   SpO2 95%   BMI 48.38 kg/m  EFM: 140/moderate variability/+accels, early decels  CVE: Dilation: 10 Dilation Complete Date: 05/12/20 Dilation Complete Time: 0605 Effacement (%): 100 Cervical Position: Posterior Station: Plus 2 Presentation: Vertex Exam by:: [email or web address], CNM    A&P: 19 y.o. [name] [redacted]w[redacted]d here for IOL for gHTN. #Labor: Now complete, will start pushing. #Pain: Epidural #FWB: Category I #GBS positive, PCN #elevated BP:no concerning labs. Will continue to monitor.  [redacted]w[redacted]d Kim Meenach, DO 6:09 AM

## 2020-05-12 NOTE — Anesthesia Postprocedure Evaluation (Signed)
Anesthesia Post Note  Patient: Kim Mejia  Procedure(s) Performed: AN AD HOC LABOR EPIDURAL     Patient location during evaluation: Mother Baby Anesthesia Type: Epidural Level of consciousness: awake Pain management: satisfactory to patient Vital Signs Assessment: post-procedure vital signs reviewed and stable Respiratory status: spontaneous breathing Cardiovascular status: stable Anesthetic complications: no    Last Vitals:  Vitals:   05/12/20 0940 05/12/20 1015  BP: 112/68 115/69  Pulse: 80 70  Resp: 16 14  Temp: 37.1 C 37 C  SpO2: 100% 99%    Last Pain:  Vitals:   05/12/20 1015  TempSrc: Oral  PainSc: 0-No pain   Pain Goal:                   [employer]

## 2020-05-13 LAB — CBC
HCT: 28.1 % — ABNORMAL LOW (ref 36.0–46.0)
Hemoglobin: 8.5 g/dL — ABNORMAL LOW (ref 12.0–15.0)
MCH: 25.2 pg — ABNORMAL LOW (ref 26.0–34.0)
MCHC: 30.2 g/dL (ref 30.0–36.0)
MCV: 83.4 fL (ref 80.0–100.0)
Platelets: 197 10*3/uL (ref 150–400)
RBC: 3.37 MIL/uL — ABNORMAL LOW (ref 3.87–5.11)
RDW: 17.2 % — ABNORMAL HIGH (ref 11.5–15.5)
WBC: 12.9 10*3/uL — ABNORMAL HIGH (ref 4.0–10.5)
nRBC: 0 % (ref 0.0–0.2)

## 2020-05-13 MED ORDER — FERROUS SULFATE 325 (65 FE) MG PO TABS
325.0000 mg | ORAL_TABLET | Freq: Every day | ORAL | Status: DC
  Administered 2020-05-13 – 2020-05-14 (×2): 325 mg via ORAL
  Filled 2020-05-13 (×2): qty 1

## 2020-05-13 MED ORDER — ENALAPRIL MALEATE 5 MG PO TABS
5.0000 mg | ORAL_TABLET | Freq: Every day | ORAL | Status: DC
  Administered 2020-05-13 – 2020-05-14 (×2): 5 mg via ORAL
  Filled 2020-05-13 (×2): qty 1

## 2020-05-13 NOTE — Progress Notes (Signed)
Post Partum Day #1 s/p SVD after presenting for IOL for gHTN and NRNST  Subjective:  Kim Mejia is a 19 y.o. G1P1001 [redacted]w[redacted]d s/p SVD for IOL for gHTN and NRNST, augmented with cytotec x2 and foley bulb, amnioinfusion for thick meconium with AROM.  No acute events overnight.  Pt denies problems with ambulating, voiding or po intake.  She denies nausea or vomiting.  Pain is well controlled.  She has had flatus. She has not had bowel movement.  Lochia Moderate.  Plan for birth control is IUD as outpatient.  Method of Feeding: Breast  Objective: BP 135/88   Pulse 79   Temp 97.8 F (36.6 C)   Resp 18   Ht 5\' 2"  (1.575 m)   Wt 120 kg   LMP 07/31/2019 (Approximate)   SpO2 99%   Breastfeeding Unknown   BMI 48.38 kg/m   Physical Exam:  General: alert, cooperative and no distress Lochia:normal flow Chest: CTAB Heart: RRR no m/r/g Abdomen: +BS, soft, nontender, fundus firm at/below umbilicus Uterine Fundus: firm DVT Evaluation: No evidence of DVT seen on physical exam. Extremities: No edema  Recent Labs    05/11/20 1850 05/12/20 0925  HGB 12.0 9.1*  HCT 39.6 30.5*    Assessment/Plan:  ASSESSMENT: Kim Mejia is a 19 y.o. G1P1001 [redacted]w[redacted]d ppd #1 s/p NSVD doing well.    - Plan for discharge tomorrow - Breastfeeding - Circumcision prior to discharge - HTN: well-controlled - Hgb: decrease from 12?9.1 after delivery, asymptomatic, will initiate ferrous sulfate - Contraception: IUD as outpatient   LOS: 2 days   [redacted]w[redacted]d 05/13/2020, 6:25 AM

## 2020-05-14 ENCOUNTER — Other Ambulatory Visit (HOSPITAL_COMMUNITY): Payer: Medicaid Other

## 2020-05-14 LAB — COMPREHENSIVE METABOLIC PANEL
ALT: 15 U/L (ref 0–44)
AST: 22 U/L (ref 15–41)
Albumin: 2.1 g/dL — ABNORMAL LOW (ref 3.5–5.0)
Alkaline Phosphatase: 122 U/L (ref 38–126)
Anion gap: 9 (ref 5–15)
BUN: 8 mg/dL (ref 6–20)
CO2: 22 mmol/L (ref 22–32)
Calcium: 8 mg/dL — ABNORMAL LOW (ref 8.9–10.3)
Chloride: 105 mmol/L (ref 98–111)
Creatinine, Ser: 0.7 mg/dL (ref 0.44–1.00)
GFR calc Af Amer: >60 mL/min (ref >60)
GFR calc non Af Amer: >60 mL/min (ref >60)
Glucose, Bld: 88 mg/dL (ref 70–99)
Potassium: 3.2 mmol/L — ABNORMAL LOW (ref 3.5–5.1)
Sodium: 136 mmol/L (ref 135–145)
Total Bilirubin: <0.1 mg/dL — ABNORMAL LOW (ref 0.3–1.2)
Total Protein: 5 g/dL — ABNORMAL LOW (ref 6.5–8.1)

## 2020-05-14 MED ORDER — IBUPROFEN 600 MG PO TABS: 600.0000 mg | ORAL_TABLET | Freq: Four times a day (QID) | ORAL | 0 refills | Status: DC

## 2020-05-14 MED ORDER — FERROUS SULFATE 325 (65 FE) MG PO TABS: 325.0000 mg | ORAL_TABLET | Freq: Every day | ORAL | 0 refills | Status: DC

## 2020-05-14 MED ORDER — ENALAPRIL MALEATE 5 MG PO TABS: 5.0000 mg | ORAL_TABLET | Freq: Every day | ORAL | 0 refills | Status: DC

## 2020-05-14 MED FILL — FERROUS SULFATE 325 MG TAB: 325 (65 FE) | 30 days supply | Qty: 30 | Fill #0

## 2020-05-14 MED FILL — IBUPROFEN 600 MG TABLET: 600 | 7 days supply | Qty: 30 | Fill #0

## 2020-05-14 MED FILL — ENALAPRIL MALEATE 5 MG TABS: 5 | 30 days supply | Qty: 30 | Fill #0

## 2020-05-14 NOTE — Discharge Instructions (Signed)

## 2020-05-14 NOTE — Lactation Note (Signed)
This note was copied from a baby's chart. Lactation Consultation Note  Patient Name: Boy Tamyia Minich [id number] Date: 05/14/2020   Baby 51 hours old.  Mother states breastfeeding has improved and she feels more comfortable. Mother states she is having more difficulty latching on the L side.  Demonstrated how to prepump w/ manual pump.  Reviewed hand expresssion w/ drops expressed and assisted w/ latching baby. Provided pillow to bring baby to nipple height. Feed on demand with cues.  Goal 8-12+ times per day after first 24 hrs.  Place baby STS if not cueing.  Reviewed engorgement care and monitoring voids/stools.      Maternal Data    Feeding Feeding Type: Breast Fed  LATCH Score Latch: Grasps breast easily, tongue down, lips flanged, rhythmical sucking.  Audible Swallowing: Spontaneous and intermittent  Type of Nipple: Everted at rest and after stimulation  Comfort (Breast/Nipple): Soft / non-tender  Hold (Positioning): No assistance needed to correctly position infant at breast.  LATCH Score: 10  Interventions    Lactation Tools Discussed/Used     Consult Status      Dahlia Byes [facility] 05/14/2020, 11:43 AM

## 2020-05-14 NOTE — Lactation Note (Signed)
This note was copied from a baby's chart. Lactation Consultation Note  Patient Name: Boy May Manrique [id number] Date: 05/14/2020  P1, 45 hour female term infant. Infant had 4 voids and 4 stools since birth. Per mom, she feels breastfeeding is going well, she only feels a tug when infant latches no pain and most infant feedings are 30 to 40 minutes in length. LC did not see a latch at this time, infant asleep on mom's chest, mom last breastfed infant approximately 2 hours ago.  Mom has no questions or concerns for LC at this time. Mom will continue to breastfeed according to hunger cues, 8-12+ times daily within 24 hours and not exceed 3 hours without breastfeeding infant. Mom knows to call RN or LC if she has any questions or concerns regarding breastfeeding.  Maternal Data    Feeding Feeding Type: Breast Fed  LATCH Score Latch: Repeated attempts needed to sustain latch, nipple held in mouth throughout feeding, stimulation needed to elicit sucking reflex.  Audible Swallowing: A few with stimulation  Type of Nipple: Everted at rest and after stimulation  Comfort (Breast/Nipple): Soft / non-tender  Hold (Positioning): No assistance needed to correctly position infant at breast.  LATCH Score: 8  Interventions    Lactation Tools Discussed/Used     Consult Status      Danelle Earthly 05/14/2020, 4:34 AM

## 2020-05-16 ENCOUNTER — Inpatient Hospital Stay (HOSPITAL_COMMUNITY)
Admission: AD | Admit: 2020-05-16 | Payer: Medicaid Other | Source: Home / Self Care | Admitting: Obstetrics & Gynecology

## 2020-05-16 ENCOUNTER — Inpatient Hospital Stay (HOSPITAL_COMMUNITY): Payer: Medicaid Other

## 2020-05-21 ENCOUNTER — Ambulatory Visit: Payer: Medicaid Other

## 2020-05-27 ENCOUNTER — Other Ambulatory Visit: Payer: Self-pay

## 2020-05-27 ENCOUNTER — Ambulatory Visit: Payer: Medicaid Other

## 2020-05-27 VITALS — BP 125/87 | HR 68

## 2020-05-27 DIAGNOSIS — Z0130 Encounter for examination of blood pressure without abnormal findings: Secondary | ICD-10-CM

## 2020-05-27 NOTE — Progress Notes (Signed)
..  Subjective:  Kim Mejia is a 19 y.o. female here for BP check.   Hypertension ROS: taking medications as instructed, no medication side effects noted, no TIA's, no chest pain on exertion, no dyspnea on exertion and no swelling of ankles.    Objective:  BP 125/87   Pulse 68   LMP 07/31/2019 (Approximate)   Appearance alert, well appearing, and in no distress. General exam BP noted to be well controlled today in office.    Assessment:   Blood Pressure well controlled.   Plan:  Current treatment plan is effective, no change in therapy.. PP visit scheduled for 06-12-20.

## 2020-06-06 ENCOUNTER — Encounter (HOSPITAL_COMMUNITY): Payer: Self-pay

## 2020-06-06 ENCOUNTER — Other Ambulatory Visit: Payer: Self-pay

## 2020-06-06 ENCOUNTER — Ambulatory Visit (HOSPITAL_COMMUNITY)
Admission: EM | Admit: 2020-06-06 | Discharge: 2020-06-06 | Disposition: A | Discharge status: Home / Self Care | Payer: Medicaid Other | Attending: Internal Medicine | Admitting: Internal Medicine

## 2020-06-06 DIAGNOSIS — R42 Dizziness and giddiness: Secondary | ICD-10-CM | POA: Diagnosis not present

## 2020-06-06 DIAGNOSIS — H6122 Impacted cerumen, left ear: Secondary | ICD-10-CM

## 2020-06-06 MED ORDER — CARBAMIDE PEROXIDE 6.5 % OT SOLN
5.0000 [drp] | Freq: Two times a day (BID) | OTIC | 0 refills | Status: AC
Start: 2020-06-06 — End: 2020-06-09

## 2020-06-06 NOTE — ED Provider Notes (Signed)
[facility]    CSN: [id number] Arrival date & time: 06/06/20  0802      History   Chief Complaint Chief Complaint  Patient presents with  . Dizziness    HPI Kim Mejia is a 19 y.o. female comes to the urgent care with complaints of dizziness today.  Patient said that she woke up this morning and felt the room spinning around her.  She denies any ringing in the ears.  No ear pain.  No ear fullness or difficulty hearing.  She denies any nausea vomiting.  No fever or chills.  No runny nose, sore throat, loss of taste or smell.  No sick contacts..   Patient had a baby 3 weeks ago.  She is sleeping well.  Currently breast-feeding.  Her oral fluid intake is great as per the patient.  No chest pain or chest pressure.  She is hypertensive on antihypertensive medications.  She typically takes her medications in the morning.  HPI  Past Medical History:  Diagnosis Date  . 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 Closed fracture of distal fibula 03/10/2017   Right - broken when kicked during soccer game  . Obesity     Patient Active Problem List   Diagnosis Date Noted  . Indication for care or intervention in labor or delivery 05/11/2020  . Gestational hypertension 05/11/2020  . Group B Streptococcus carrier, +RV culture, currently pregnant 04/20/2020  . Anemia in pregnancy 02/11/2020  . Obesity in pregnancy 12/04/2019  . 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 UTI in pregnancy, antepartum, first trimester 10/15/2019  . Supervision of other normal pregnancy, antepartum 10/02/2019  . BMI (body mass index), pediatric, 95-99% for age 39/25/2016  . Language barrier 03/21/2015  . Pain of left calf 08/21/2013    Past Surgical History:  Procedure Laterality Date  . ORIF FIBULA FRACTURE Right 03/14/2017   Procedure:  RIGHT OPEN REDUCTION INTERNAL FIXATION LATERAL MALLEOLUS;  Surgeon: Eldred Manges, MD;  Location: [facility];  Service: Orthopedics;  Laterality: Right;    OB History    Gravida  1   Para  1   Term  1   Preterm      AB      Living  1     SAB      TAB      Ectopic      Multiple  0   Live Births  1            Home Medications    Prior to Admission medications   Medication Sig Start Date End Date Taking? Authorizing Provider  Blood Pressure Monitoring (BLOOD PRESSURE CUFF) MISC 1 Device by Does not apply route once a week. 10/02/19   Brock Bad, MD  carbamide peroxide (DEBROX) 6.5 % OTIC solution Place 5 drops into the left ear 2 (two) times daily for 3 days. 06/06/20 06/09/20  Merrilee Jansky, MD  enalapril (VASOTEC) 5 MG tablet Take 1 tablet (5 mg total) by mouth daily. 05/14/20   Meccariello, Solmon Ice, DO  Prenatal Vit-Fe Fumarate-FA (MULTIVITAMIN-PRENATAL) 27-0.8 MG TABS tablet Take 1 tablet by mouth daily at 12 noon.    [provider]  ferrous sulfate 325 (65 FE) MG tablet Take 1 tablet (325 mg total) by mouth daily with breakfast. Patient not taking: Reported on 05/27/2020 05/14/20  06/06/20  Meccariello, Solmon Ice, DO    Family History Family History  Problem Relation Age of Onset  . Hyperlipidemia Mother     Social History Social History   Tobacco Use  . Smoking status: Never Smoker  . Smokeless tobacco: Never Used  . Tobacco comment: father smokes, visits on weekends  Vaping Use  . Vaping Use: Never used  Substance Use Topics  . Alcohol use: No    Alcohol/week: 0.0 standard drinks  . Drug use: No     Allergies   Patient has no known allergies.   Review of Systems Review of Systems  Constitutional: Negative.   Genitourinary: Negative.   Skin: Negative.   Neurological: Positive for dizziness. Negative for seizures, syncope, weakness, light-headedness, numbness and headaches.  Psychiatric/Behavioral: Negative for confusion and  decreased concentration.     Physical Exam Triage Vital Signs ED Triage Vitals  Enc Vitals Group     BP 06/06/20 0820 103/72     Pulse Rate 06/06/20 0820 64     Resp 06/06/20 0820 16     Temp 06/06/20 0820 98.3 F (36.8 C)     Temp Source 06/06/20 0820 Oral     SpO2 06/06/20 0820 98 %     Weight 06/06/20 0821 240 lb (108.9 kg)     Height 06/06/20 0821 5\' 2"  (1.575 m)     Head Circumference --      Peak Flow --      Pain Score 06/06/20 0820 0     Pain Loc --      Pain Edu? --      Excl. in GC? --    No data found.  Updated Vital Signs BP 103/72   Pulse 64   Temp 98.3 F (36.8 C) (Oral)   Resp 16   Ht 5\' 2"  (1.575 m)   Wt 108.9 kg   LMP 07/31/2019 (Approximate)   SpO2 98%   BMI 43.90 kg/m   Visual Acuity Right Eye Distance:   Left Eye Distance:   Bilateral Distance:    Right Eye Near:   Left Eye Near:    Bilateral Near:     Physical Exam Vitals and nursing note reviewed.  Constitutional:      Appearance: Normal appearance.  HENT:     Right Ear: Tympanic membrane normal.     Left Ear: Tympanic membrane normal.     Ears:     Comments: Cerumen impaction in the left ear.  No erythema of the tympanic membrane. Cardiovascular:     Rate and Rhythm: Normal rate and regular rhythm.  Musculoskeletal:     Cervical back: Normal range of motion and neck supple.  Lymphadenopathy:     Cervical: No cervical adenopathy.  Neurological:     Mental Status: She is alert.      UC Treatments / Results  Labs (all labs ordered are listed, but only abnormal results are displayed) Labs Reviewed - No data to display  EKG   Radiology No results found.  Procedures Procedures (including critical care time)  Medications Ordered in UC Medications - No data to display  Initial Impression / Assessment and Plan / UC Course  I have reviewed the triage vital signs and the nursing notes.  Pertinent labs & imaging results that were available during my care of the  patient were reviewed by me and considered in my medical decision making (see chart for details).    1.  Vertigo-currently resolved: This is likely secondary  to cerumen impaction in the left ear Because of proximity of the impacted wax to the eardrum I will try Debrox Debrox left ear twice daily for 3 days If symptoms persist she is advised to return to urgent care I anticipate that the symptoms will resolve. Return precautions given. Final Clinical Impressions(s) / UC Diagnoses   Final diagnoses:  Vertigo  Impacted cerumen of left ear   Discharge Instructions   None    ED Prescriptions    Medication Sig Dispense Auth. Provider   carbamide peroxide (DEBROX) 6.5 % OTIC solution Place 5 drops into the left ear 2 (two) times daily for 3 days. 15 mL Alquan Morrish, Myrene Galas, MD     PDMP not reviewed this encounter.   Chase Picket, MD 06/06/20 [phone]

## 2020-06-06 NOTE — ED Triage Notes (Signed)
Pt c/o dizziness that started at 0500 upon waking today. Pt denies any other symptoms

## 2020-06-12 ENCOUNTER — Ambulatory Visit: Payer: Medicaid Other | Admitting: Women's Health
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
## 2021-03-07 IMAGING — US US OB < 14 WEEKS - US OB TV
1 series · 15 of 28 positions shown · non-contrast
Comparison: None.

CLINICAL DATA: Pregnant patient in first-trimester pregnancy with
vaginal bleeding. Pregnancy of unknown anatomic location.

EXAM:
OBSTETRIC <14 WK US AND TRANSVAGINAL OB US
TECHNIQUE: Both transabdominal and transvaginal ultrasound examinations were
performed for complete evaluation of the gestation as well as the
maternal uterus, adnexal regions, and pelvic cul-de-sac.
Transvaginal technique was performed to assess early pregnancy.

[Series 1: us ob < 14 weeks - us ob tv · 36 acquisitions, 15 frames shown]
[im 1/36]
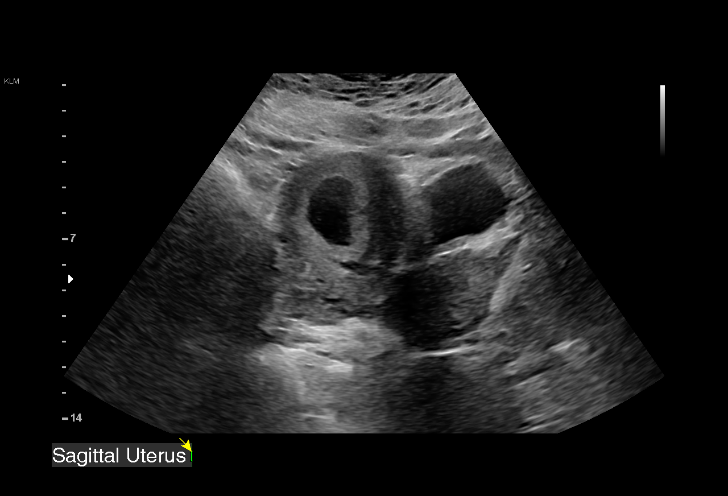
[im 3/36]
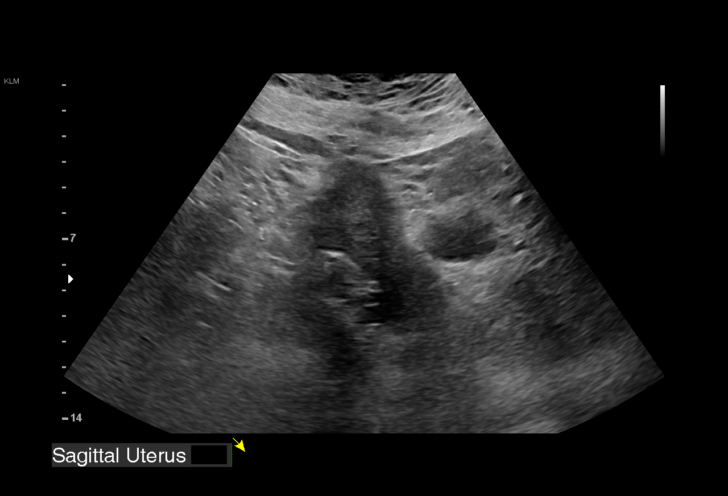
[im 6/36]
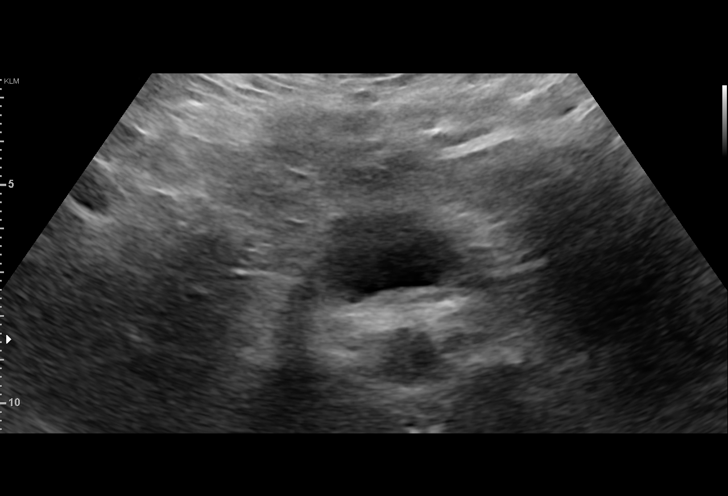
[im 8/36]
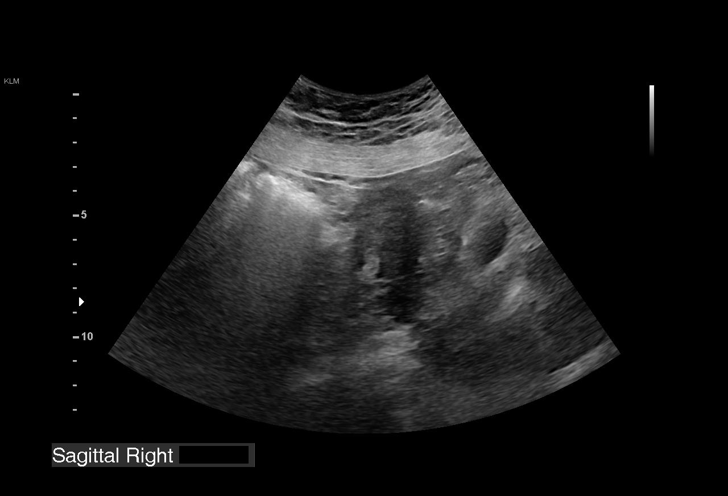
[im 11/36]
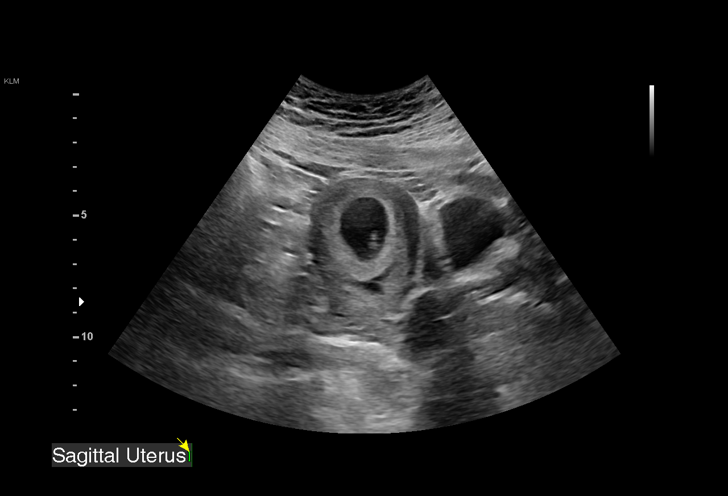
[im 13/36]
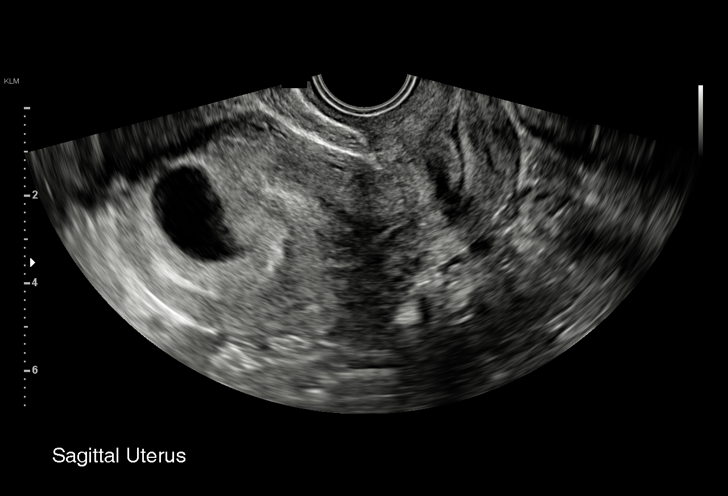
[im 16/36]
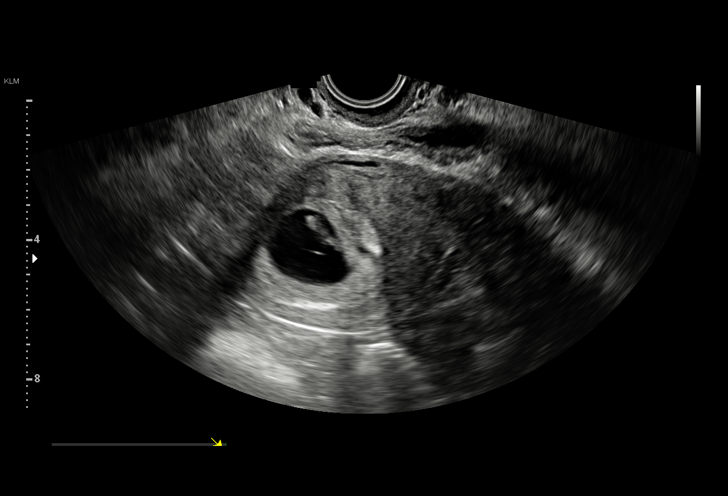
[im 19/36]
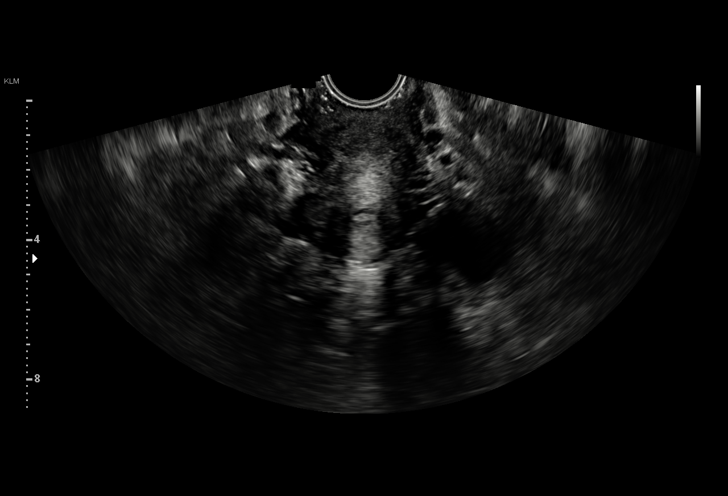
[im 20/36]
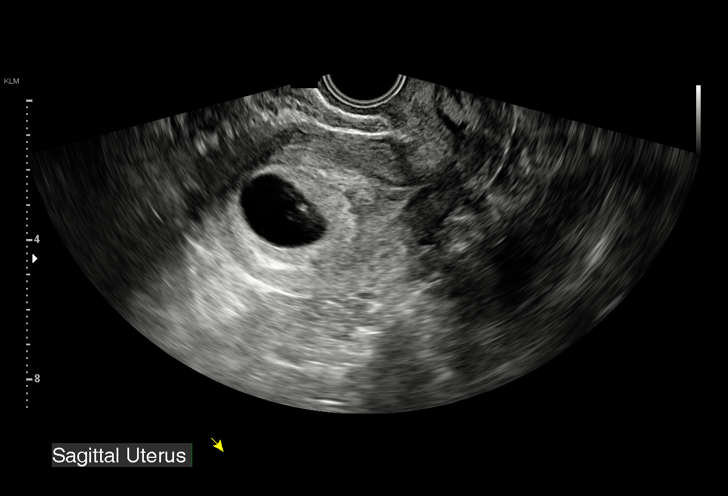
[im 23/36]
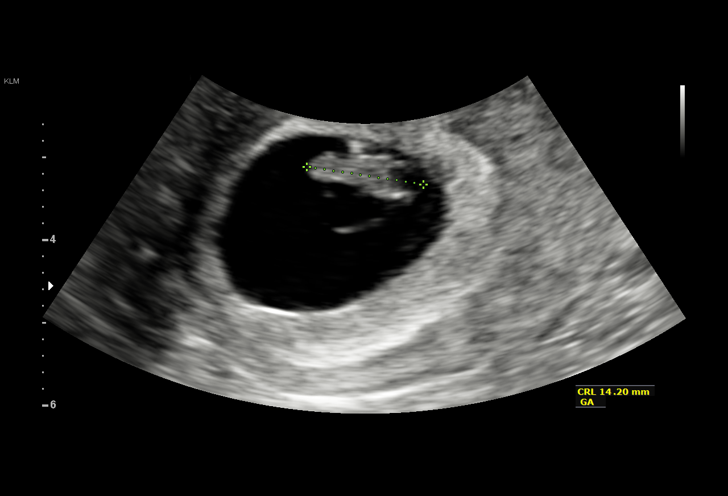
[im 25/36]
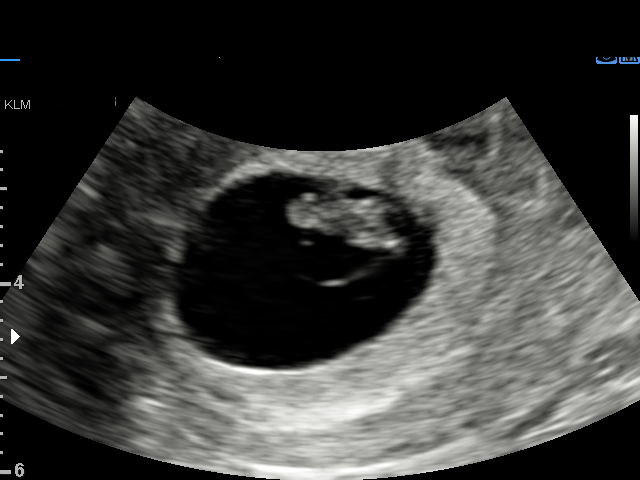
[im 28/36]
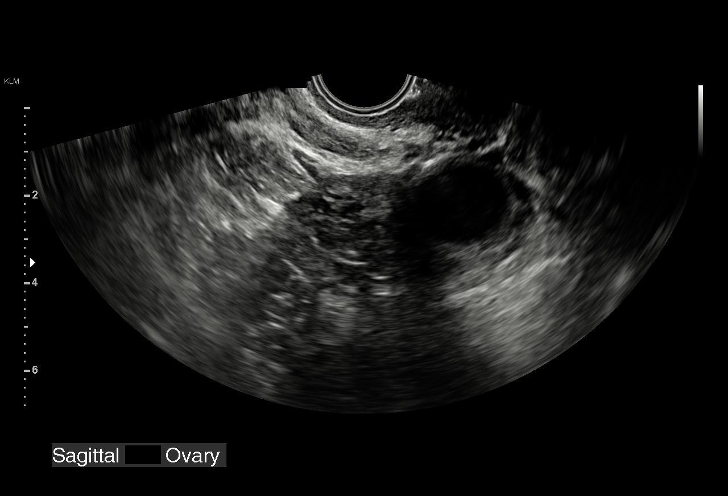
[im 30/36]
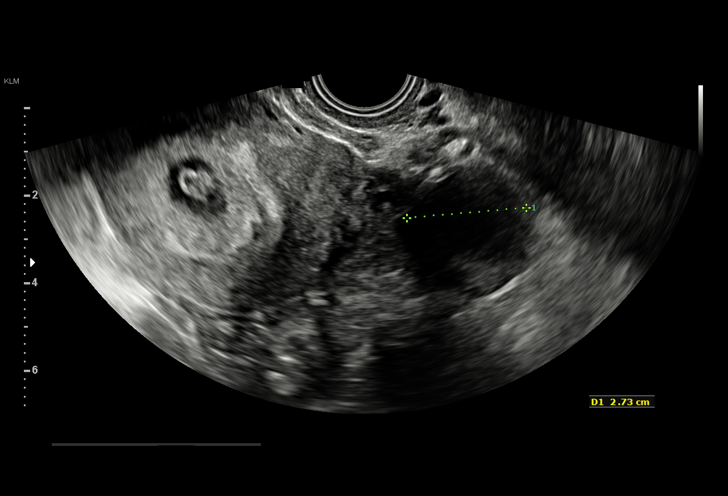
[im 33/36]
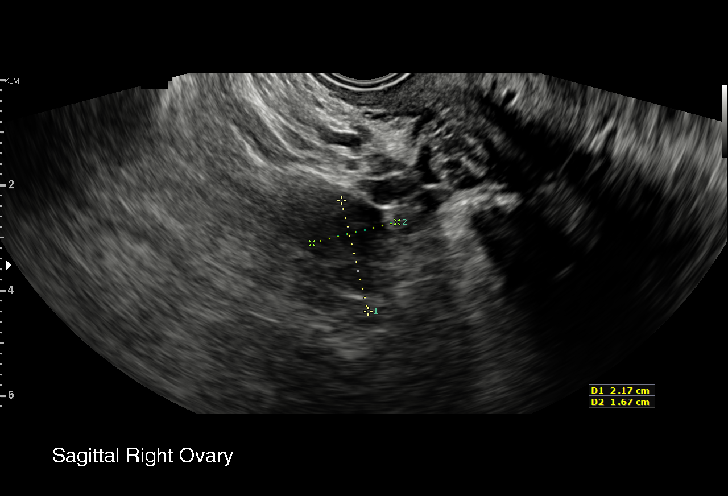
[im 36/36]
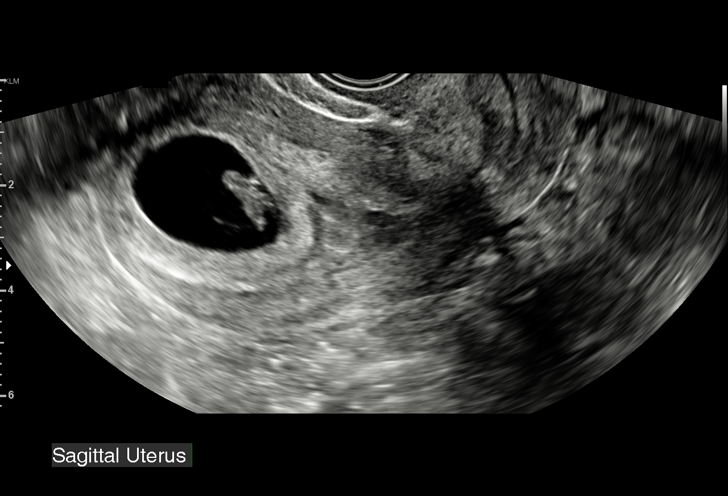

[15 of 28 positions shown; findings below may reference images not displayed]

FINDINGS: Intrauterine gestational sac: Single

Yolk sac:  Visualized.

Embryo:  Visualized.

Cardiac Activity: Visualized.

Heart Rate: 164 bpm

CRL:  14 mm   7 w   4 d                  US EDC: 05/09/2020

Subchorionic hemorrhage:  None visualized.

Maternal uterus/adnexae: Both ovaries are well visualized and are
normal. Small corpus luteal cyst in the left ovary. Normal ovarian
blood flow. No pelvic free fluid or adnexal mass.
IMPRESSION: Single live intrauterine pregnancy estimated gestational age 7 weeks
4 days based on crown-rump length for ultrasound EDC 05/09/2020. No
subchorionic hemorrhage.

## 2021-05-27 ENCOUNTER — Encounter (HOSPITAL_COMMUNITY): Payer: Self-pay | Admitting: Emergency Medicine

## 2021-05-27 ENCOUNTER — Other Ambulatory Visit: Payer: Self-pay

## 2021-05-27 ENCOUNTER — Ambulatory Visit (HOSPITAL_COMMUNITY)
Admission: EM | Admit: 2021-05-27 | Discharge: 2021-05-27 | Disposition: A | Discharge status: Home / Self Care | Payer: Medicaid Other | Attending: Internal Medicine | Admitting: Internal Medicine

## 2021-05-27 DIAGNOSIS — J111 Influenza due to unidentified influenza virus with other respiratory manifestations: Secondary | ICD-10-CM | POA: Diagnosis not present

## 2021-05-27 DIAGNOSIS — B349 Viral infection, unspecified: Secondary | ICD-10-CM

## 2021-05-27 LAB — POC INFLUENZA A AND B ANTIGEN (URGENT CARE ONLY)
INFLUENZA A ANTIGEN, POC: POSITIVE — AB
INFLUENZA B ANTIGEN, POC: NEGATIVE

## 2021-05-27 MED ORDER — BENZONATATE 100 MG PO CAPS
100.0000 mg | ORAL_CAPSULE | Freq: Three times a day (TID) | ORAL | 0 refills | Status: DC
Start: 2021-05-27 — End: 2021-07-31

## 2021-05-27 MED ORDER — ACETAMINOPHEN 325 MG PO TABS
650.0000 mg | ORAL_TABLET | Freq: Once | ORAL | Status: AC
  Administered 2021-05-27: 650 mg via ORAL

## 2021-05-27 MED ORDER — GUAIFENESIN-DM 100-10 MG/5ML PO SYRP
5.0000 mL | ORAL_SOLUTION | ORAL | 0 refills | Status: DC | PRN
Start: 2021-05-27 — End: 2021-07-31

## 2021-05-27 MED ORDER — ACETAMINOPHEN 325 MG PO TABS
ORAL_TABLET | ORAL | Status: AC
  Filled 2021-05-27: qty 2

## 2021-05-27 MED ORDER — FLUTICASONE PROPIONATE 50 MCG/ACT NA SUSP
1.0000 | Freq: Two times a day (BID) | NASAL | 0 refills | Status: DC
Start: 2021-05-27 — End: 2021-07-31

## 2021-05-27 NOTE — ED Triage Notes (Signed)
PT reports cough, sore throat, chills that started yesterday. PT had COVID 4-5 months ago.

## 2021-05-27 NOTE — Discharge Instructions (Addendum)
Can use tessalon three times a day for cough  Can use nasal spray twice a day for congestion   Can use 63mL of robitussin every 4 hours for cough and congestion  Salt water gargles, hot tea or liquids, teaspoon of honey for sore throat  400-600 mg ibuprofen or 650 mg tylenol every six hours for fever, headache

## 2021-05-28 NOTE — ED Provider Notes (Signed)
[facility]    CSN: [id number] Arrival date & time: 05/27/21  1858      History   Chief Complaint Chief Complaint  Patient presents with  . Cough  . Sore Throat    HPI Kim Mejia is a 20 y.o. female.   Patient presents with non productive cough, nasal congestion, rhinorrhea, post nasal drip, sore throat, sinus pressure, fatigue, chills and chest soreness beginning 2 days ago. Able to tolerate food and liquids. Denies headache, ear pain, chest pain. No known sick contacts. Had COVID 5 months ago. Unvaccinated.   Past Medical History:  Diagnosis Date  . 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 Closed fracture of distal fibula 03/10/2017   Right - broken when kicked during soccer game  . Obesity     Patient Active Problem List   Diagnosis Date Noted  . Indication for care or intervention in labor or delivery 05/11/2020  . Gestational hypertension 05/11/2020  . Group B Streptococcus carrier, +RV culture, currently pregnant 04/20/2020  . Anemia in pregnancy 02/11/2020  . Obesity in pregnancy 12/04/2019  . 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 UTI in pregnancy, antepartum, first trimester 10/15/2019  . Supervision of other normal pregnancy, antepartum 10/02/2019  . BMI (body mass index), pediatric, 95-99% for age 64/25/2016  . Language barrier 03/21/2015  . Pain of left calf 08/21/2013    Past Surgical History:  Procedure Laterality Date  . ORIF FIBULA FRACTURE Right 03/14/2017   Procedure: RIGHT OPEN REDUCTION INTERNAL FIXATION LATERAL MALLEOLUS;  Surgeon: Eldred Manges, MD;  Location: [facility];  Service: Orthopedics;  Laterality: Right;    OB History    Gravida  1   Para  1   Term  1   Preterm      AB      Living  1     SAB      IAB      Ectopic      Multiple  0   Live Births  1             Home Medications    Prior to Admission medications   Medication Sig Start Date End Date Taking? Authorizing Provider  benzonatate (TESSALON) 100 MG capsule Take 1 capsule (100 mg total) by mouth every 8 (eight) hours. 05/27/21  Yes Najla Aughenbaugh R, NP  fluticasone (FLONASE) 50 MCG/ACT nasal spray Place 1 spray into both nostrils in the morning and at bedtime. 05/27/21  Yes Julionna Marczak R, NP  guaiFENesin-dextromethorphan (ROBITUSSIN DM) 100-10 MG/5ML syrup Take 5 mLs by mouth every 4 (four) hours as needed for cough. 05/27/21  Yes Jackye Dever, Elita Boone, NP  Blood Pressure Monitoring (BLOOD PRESSURE CUFF) MISC 1 Device by Does not apply route once a week. 10/02/19   Brock Bad, MD  enalapril (VASOTEC) 5 MG tablet Take 1 tablet (5 mg total) by mouth daily. 05/14/20   Meccariello, Solmon Ice, DO  Prenatal Vit-Fe Fumarate-FA (MULTIVITAMIN-PRENATAL) 27-0.8 MG TABS tablet Take 1 tablet by mouth daily at 12 noon.    [provider]  ferrous sulfate 325 (65 FE) MG tablet Take 1 tablet (325 mg total) by mouth daily with breakfast. Patient not taking: Reported on 05/27/2020 05/14/20 06/06/20  Meccariello, Solmon Ice, DO    Family History Family History  Problem Relation Age of Onset  .  Hyperlipidemia Mother     Social History Social History   Tobacco Use  . Smoking status: Never Smoker  . Smokeless tobacco: Never Used  . Tobacco comment: father smokes, visits on weekends  Vaping Use  . Vaping Use: Never used  Substance Use Topics  . Alcohol use: No    Alcohol/week: 0.0 standard drinks  . Drug use: No     Allergies   Patient has no known allergies.   Review of Systems Review of Systems  Defer to HPI   Physical Exam Triage Vital Signs ED Triage Vitals  Enc Vitals Group     BP 05/27/21 1911 113/66     Pulse Rate 05/27/21 1909 (!) 125     Resp 05/27/21 1909 16     Temp 05/27/21 1909 (!) 101.5 F (38.6 C)     Temp Source 05/27/21 1909 Oral     SpO2 05/27/21  1909 95 %     Weight --      Height --      Head Circumference --      Peak Flow --      Pain Score 05/27/21 1907 2     Pain Loc --      Pain Edu? --      Excl. in GC? --    No data found.  Updated Vital Signs BP 113/66   Pulse (!) 125   Temp (!) 101.5 F (38.6 C) (Oral)   Resp 16   LMP 05/22/2021   SpO2 95%   Visual Acuity Right Eye Distance:   Left Eye Distance:   Bilateral Distance:    Right Eye Near:   Left Eye Near:    Bilateral Near:     Physical Exam Constitutional:      Appearance: She is well-developed. She is obese.  HENT:     Head: Normocephalic.     Right Ear: Tympanic membrane and ear canal normal.     Left Ear: Tympanic membrane and ear canal normal.     Nose: Congestion and rhinorrhea present.     Mouth/Throat:     Mouth: Mucous membranes are moist.     Pharynx: Posterior oropharyngeal erythema present.     Tonsils: No tonsillar exudate or tonsillar abscesses.  Eyes:     Conjunctiva/sclera: Conjunctivae normal.     Pupils: Pupils are equal, round, and reactive to light.  Cardiovascular:     Rate and Rhythm: Normal rate and regular rhythm.     Heart sounds: Normal heart sounds.  Pulmonary:     Effort: Pulmonary effort is normal.     Breath sounds: Normal breath sounds.  Musculoskeletal:     Cervical back: Normal range of motion.  Lymphadenopathy:     Cervical: Cervical adenopathy present.  Skin:    General: Skin is warm and dry.  Neurological:     Mental Status: She is alert and oriented to person, place, and time.  Psychiatric:        Mood and Affect: Mood normal.        Behavior: Behavior normal.      UC Treatments / Results  Labs (all labs ordered are listed, but only abnormal results are displayed) Labs Reviewed  POC INFLUENZA A AND B ANTIGEN (URGENT CARE ONLY) - Abnormal; Notable for the following components:      Result Value   INFLUENZA A ANTIGEN, POC POSITIVE (*)    All other components within normal limits     EKG   Radiology  No results found.  Procedures Procedures (including critical care time)  Medications Ordered in UC Medications  acetaminophen (TYLENOL) tablet 650 mg (650 mg Oral Given 05/27/21 1913)    Initial Impression / Assessment and Plan / UC Course  I have reviewed the triage vital signs and the nursing notes.  Pertinent labs & imaging results that were available during my care of the patient were reviewed by me and considered in my medical decision making (see chart for details).  Influenza  1. Flu test- positive for A  2. flonase 1 spray each nare bid 3. Tessalon 100 mg bid 4. Robitussin 100-10 5 mL every 4 hours prn 5. Tylenol 650 mg now for fever   Final Clinical Impressions(s) / UC Diagnoses   Final diagnoses:  Influenza with respiratory manifestation     Discharge Instructions     Can use tessalon three times a day for cough  Can use nasal spray twice a day for congestion   Can use 55mL of robitussin every 4 hours for cough and congestion  Salt water gargles, hot tea or liquids, teaspoon of honey for sore throat  400-600 mg ibuprofen or 650 mg tylenol every six hours for fever, headache    ED Prescriptions    Medication Sig Dispense Auth. Provider   benzonatate (TESSALON) 100 MG capsule Take 1 capsule (100 mg total) by mouth every 8 (eight) hours. 21 capsule Eulalie Speights R, NP   guaiFENesin-dextromethorphan (ROBITUSSIN DM) 100-10 MG/5ML syrup Take 5 mLs by mouth every 4 (four) hours as needed for cough. 118 mL Kadarius Cuffe R, NP   fluticasone (FLONASE) 50 MCG/ACT nasal spray Place 1 spray into both nostrils in the morning and at bedtime. 9.9 mL Valinda Hoar, NP     PDMP not reviewed this encounter.   Valinda Hoar, [place] 05/28/21 [phone]

## 2021-06-22 ENCOUNTER — Encounter (HOSPITAL_COMMUNITY): Payer: Self-pay | Admitting: Emergency Medicine

## 2021-06-22 ENCOUNTER — Other Ambulatory Visit: Payer: Self-pay

## 2021-06-22 ENCOUNTER — Emergency Department (HOSPITAL_COMMUNITY)
Admission: EM | Admit: 2021-06-22 | Discharge: 2021-06-22 | Disposition: A | Discharge status: Home / Self Care | Payer: Medicaid Other | Attending: Emergency Medicine | Admitting: Emergency Medicine

## 2021-06-22 DIAGNOSIS — H66001 Acute suppurative otitis media without spontaneous rupture of ear drum, right ear: Secondary | ICD-10-CM | POA: Insufficient documentation

## 2021-06-22 DIAGNOSIS — H9201 Otalgia, right ear: Secondary | ICD-10-CM | POA: Diagnosis present

## 2021-06-22 MED ORDER — AMOXICILLIN 500 MG PO CAPS
500.0000 mg | ORAL_CAPSULE | Freq: Once | ORAL | Status: AC
  Administered 2021-06-22: 500 mg via ORAL
  Filled 2021-06-22: qty 1

## 2021-06-22 MED ORDER — AMOXICILLIN 500 MG PO CAPS: 500.0000 mg | ORAL_CAPSULE | Freq: Three times a day (TID) | ORAL | 0 refills | Status: AC

## 2021-06-22 NOTE — ED Notes (Signed)
E-signature pad unavailable at time of pt discharge. This RN discussed discharge materials with pt and answered all pt questions. Pt stated understanding of discharge material. ? ?

## 2021-06-22 NOTE — ED Triage Notes (Signed)
Patient reports right ear "clogged" this evening , denies pain , no hearing loss.

## 2021-06-22 NOTE — Discharge Instructions (Addendum)
1. Medications: Amoxicillin, tylenol/ibuprofen for pain or fever, usual home medications 2. Treatment: rest, drink plenty of fluids,  3. Follow Up: Please followup with your primary doctor in 2-3 days for discussion of your diagnoses and further evaluation after today's visit; if you do not have a primary care doctor use the resource guide provided to find one; Please return to the ER for worsening pain, high fevers or other concerns.

## 2021-06-22 NOTE — ED Provider Notes (Signed)
[facility] EMERGENCY DEPARTMENT Provider Note   CSN: [id number] Arrival date & time: 06/22/21  0108     History Chief Complaint  Patient presents with   Ear Discomfort " Clogged"     Kim Mejia is a 20 y.o. female presents to the Emergency Department complaining of gradual, persistent, progressively worsening right ear pain onset several hours PTA. Associated symptoms include nasal congestion for approx 1 week.  Patient took ibuprofen with mild relief.  No other aggravating or alleviating factors.  Patient reports associated symptoms of her ear feeling clogged.  Patient denies fever, chills, nausea, vomiting, headache, vision changes.   The history is provided by the patient and medical records. No language interpreter was used.      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   Was evaluated by cardiology,  To have further testing, possible popliteal entrapment, consult with vascular surgery, or ortho if results neg    Closed fracture of distal fibula 03/10/2017   Right - broken when kicked during soccer game   Obesity     Patient Active Problem List   Diagnosis Date Noted   Indication for care or intervention in labor or delivery 05/11/2020   Gestational hypertension 05/11/2020   Group B Streptococcus carrier, +RV culture, currently pregnant 04/20/2020   Anemia in pregnancy 02/11/2020   Obesity in pregnancy 12/04/2019   Cystic fibrosis carrier, 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   UTI in pregnancy, antepartum, first trimester 10/15/2019   Supervision of other normal pregnancy, antepartum 10/02/2019   BMI (body mass index), pediatric, 95-99% for age 64/25/2016   Language barrier 03/21/2015   Pain of left calf 08/21/2013    Past Surgical History:  Procedure Laterality Date   ORIF FIBULA FRACTURE Right 03/14/2017   Procedure: RIGHT OPEN REDUCTION INTERNAL FIXATION LATERAL MALLEOLUS;  Surgeon: Eldred Manges, MD;  Location:  [facility];  Service: Orthopedics;  Laterality: Right;     OB History     Gravida  1   Para  1   Term  1   Preterm      AB      Living  1      SAB      IAB      Ectopic      Multiple  0   Live Births  1           Family History  Problem Relation Age of Onset   Hyperlipidemia Mother     Social History   Tobacco Use   Smoking status: Never   Smokeless tobacco: Never   Tobacco comments:    father smokes, visits on weekends  Vaping Use   Vaping Use: Never used  Substance Use Topics   Alcohol use: No    Alcohol/week: 0.0 standard drinks   Drug use: No    Home Medications Prior to Admission medications   Medication Sig Start Date End Date Taking? Authorizing Provider  amoxicillin (AMOXIL) 500 MG capsule Take 1 capsule (500 mg total) by mouth 3 (three) times daily for 10 days. 06/22/21 07/02/21 Yes Damonie Furney, Dahlia Client, PA-C  benzonatate (TESSALON) 100 MG capsule Take 1 capsule (100 mg total) by mouth every 8 (eight) hours. 05/27/21   Valinda Hoar, NP  Blood Pressure Monitoring (BLOOD PRESSURE CUFF) MISC 1 Device by Does not apply route once a week. 10/02/19   Brock Bad, MD  enalapril (VASOTEC) 5 MG tablet Take 1 tablet (5 mg total) by  mouth daily. 05/14/20   Meccariello, Solmon Ice, DO  fluticasone (FLONASE) 50 MCG/ACT nasal spray Place 1 spray into both nostrils in the morning and at bedtime. 05/27/21   White, Elita Boone, NP  guaiFENesin-dextromethorphan (ROBITUSSIN DM) 100-10 MG/5ML syrup Take 5 mLs by mouth every 4 (four) hours as needed for cough. 05/27/21   White, Elita Boone, NP  Prenatal Vit-Fe Fumarate-FA (MULTIVITAMIN-PRENATAL) 27-0.8 MG TABS tablet Take 1 tablet by mouth daily at 12 noon.    [provider]  ferrous sulfate 325 (65 FE) MG tablet Take 1 tablet (325 mg total) by mouth daily with breakfast. Patient not taking: Reported on 05/27/2020 05/14/20 06/06/20  Meccariello, Solmon Ice, DO    Allergies    Patient has no known  allergies.  Review of Systems   Review of Systems  Constitutional:  Negative for fever.  HENT:  Positive for congestion and ear pain.   Eyes:  Negative for visual disturbance.  Respiratory:  Negative for cough.   Neurological:  Negative for headaches.   Physical Exam Updated Vital Signs BP (!) 131/93 (BP Location: Left Arm)   Pulse 71   Temp 97.9 F (36.6 C) (Oral)   Resp 16   Ht 5\' 2"  (1.575 m)   Wt 120 kg   LMP 06/15/2021   SpO2 100%   BMI 48.39 kg/m   Physical Exam Vitals and nursing note reviewed.  Constitutional:      General: She is not in acute distress.    Appearance: She is well-developed. She is not ill-appearing.  HENT:     Head: Normocephalic.     Jaw: There is normal jaw occlusion.     Right Ear: Ear canal normal. A middle ear effusion is present. Tympanic membrane is erythematous and bulging. Tympanic membrane is not perforated.     Left Ear: Tympanic membrane and ear canal normal.  Eyes:     General: No scleral icterus.    Conjunctiva/sclera: Conjunctivae normal.  Cardiovascular:     Rate and Rhythm: Normal rate.  Pulmonary:     Effort: Pulmonary effort is normal.  Abdominal:     General: There is no distension.  Musculoskeletal:        General: Normal range of motion.     Cervical back: Normal range of motion.  Skin:    General: Skin is warm and dry.  Neurological:     Mental Status: She is alert.  Psychiatric:        Mood and Affect: Mood normal.    ED Results / Procedures / Treatments     Procedures Procedures   Medications Ordered in ED Medications  amoxicillin (AMOXIL) capsule 500 mg (500 mg Oral Given 06/22/21 0345)    ED Course  I have reviewed the triage vital signs and the nursing notes.  Pertinent labs & imaging results that were available during my care of the patient were reviewed by me and considered in my medical decision making (see chart for details).    MDM Rules/Calculators/A&P                          Patient  presents with otalgia and exam consistent with acute otitis media. No concern for acute mastoiditis, meningitis.  No antibiotic use in the last month.  Patient discharged home with Amoxicillin.   PCP follow-up as needed. I have also discussed reasons to return immediately to the ER.  Parent expresses understanding and agrees with plan.  Final Clinical Impression(s) / ED Diagnoses Final diagnoses:  Non-recurrent acute suppurative otitis media of right ear without spontaneous rupture of tympanic membrane    Rx / DC Orders ED Discharge Orders          Ordered    amoxicillin (AMOXIL) 500 MG capsule  3 times daily        06/22/21 0331             Nyella Eckels, Dahlia Client, PA-C 06/22/21 0353    Zadie Rhine, MD 06/22/21 [phone]

## 2021-06-29 ENCOUNTER — Other Ambulatory Visit: Payer: Self-pay

## 2021-06-29 ENCOUNTER — Emergency Department (HOSPITAL_COMMUNITY)
Admission: EM | Admit: 2021-06-29 | Discharge: 2021-06-29 | Disposition: A | Discharge status: Home / Self Care | Payer: Medicaid Other | Attending: Emergency Medicine | Admitting: Emergency Medicine

## 2021-06-29 ENCOUNTER — Emergency Department (HOSPITAL_COMMUNITY): Payer: Medicaid Other

## 2021-06-29 DIAGNOSIS — M25571 Pain in right ankle and joints of right foot: Secondary | ICD-10-CM | POA: Diagnosis not present

## 2021-06-29 NOTE — ED Provider Notes (Signed)
[facility] EMERGENCY DEPARTMENT Provider Note   CSN: [id number] Arrival date & time: 06/29/21  1553     History Chief Complaint  Patient presents with   Ankle Pain    Kim Mejia is a 20 y.o. female.  HPI     Kim Mejia is a 20 y.o. female, with a history of distal right fibula fracture, presenting to the ED with pain to the right ankle beginning about 3 days ago. Endorses pain and then swelling to the right lateral ankle, aching, nonradiating Denies known trauma, fever, deformity, neurologic deficits, other pain in the extremity, or any other complaints.   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   Was evaluated by cardiology,  To have further testing, possible popliteal entrapment, consult with vascular surgery, or ortho if results neg    Closed fracture of distal fibula 03/10/2017   Right - broken when kicked during soccer game   Obesity     Patient Active Problem List   Diagnosis Date Noted   Indication for care or intervention in labor or delivery 05/11/2020   Gestational hypertension 05/11/2020   Group B Streptococcus carrier, +RV culture, currently pregnant 04/20/2020   Anemia in pregnancy 02/11/2020   Obesity in pregnancy 12/04/2019   Cystic fibrosis carrier, 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   UTI in pregnancy, antepartum, first trimester 10/15/2019   Supervision of other normal pregnancy, antepartum 10/02/2019   BMI (body mass index), pediatric, 95-99% for age 86/25/2016   Language barrier 03/21/2015   Pain of left calf 08/21/2013    Past Surgical History:  Procedure Laterality Date   ORIF FIBULA FRACTURE Right 03/14/2017   Procedure: RIGHT OPEN REDUCTION INTERNAL FIXATION LATERAL MALLEOLUS;  Surgeon: Eldred Manges, MD;  Location: [facility];  Service: Orthopedics;  Laterality: Right;     OB History     Gravida  1   Para  1   Term  1   Preterm      AB      Living  1      SAB      IAB       Ectopic      Multiple  0   Live Births  1           Family History  Problem Relation Age of Onset   Hyperlipidemia Mother     Social History   Tobacco Use   Smoking status: Never   Smokeless tobacco: Never   Tobacco comments:    father smokes, visits on weekends  Vaping Use   Vaping Use: Never used  Substance Use Topics   Alcohol use: No    Alcohol/week: 0.0 standard drinks   Drug use: No    Home Medications Prior to Admission medications   Medication Sig Start Date End Date Taking? Authorizing Provider  amoxicillin (AMOXIL) 500 MG capsule Take 1 capsule (500 mg total) by mouth 3 (three) times daily for 10 days. 06/22/21 07/02/21  Muthersbaugh, Dahlia Client, PA-C  benzonatate (TESSALON) 100 MG capsule Take 1 capsule (100 mg total) by mouth every 8 (eight) hours. 05/27/21   Valinda Hoar, NP  Blood Pressure Monitoring (BLOOD PRESSURE CUFF) MISC 1 Device by Does not apply route once a week. 10/02/19   Brock Bad, MD  enalapril (VASOTEC) 5 MG tablet Take 1 tablet (5 mg total) by mouth daily. 05/14/20   Meccariello, Solmon Ice, DO  fluticasone (FLONASE) 50 MCG/ACT nasal spray Place 1 spray into  both nostrils in the morning and at bedtime. 05/27/21   White, Elita Boone, NP  guaiFENesin-dextromethorphan (ROBITUSSIN DM) 100-10 MG/5ML syrup Take 5 mLs by mouth every 4 (four) hours as needed for cough. 05/27/21   White, Elita Boone, NP  Prenatal Vit-Fe Fumarate-FA (MULTIVITAMIN-PRENATAL) 27-0.8 MG TABS tablet Take 1 tablet by mouth daily at 12 noon.    [provider]  ferrous sulfate 325 (65 FE) MG tablet Take 1 tablet (325 mg total) by mouth daily with breakfast. Patient not taking: Reported on 05/27/2020 05/14/20 06/06/20  Meccariello, Solmon Ice, DO    Allergies    Patient has no known allergies.  Review of Systems   Review of Systems  Constitutional:  Negative for fever.  Musculoskeletal:  Positive for arthralgias and joint swelling.  Neurological:  Negative for weakness  and numbness.   Physical Exam Updated Vital Signs BP 109/70 (BP Location: Right Arm)   Pulse 72   Temp 98.6 F (37 C) (Oral)   Resp 18   LMP 06/15/2021   SpO2 100%   Physical Exam Vitals and nursing note reviewed.  Constitutional:      General: She is not in acute distress.    Appearance: Normal appearance. She is well-developed. She is not diaphoretic.  HENT:     Head: Normocephalic and atraumatic.  Eyes:     Conjunctiva/sclera: Conjunctivae normal.  Cardiovascular:     Rate and Rhythm: Normal rate and regular rhythm.     Pulses:          Dorsalis pedis pulses are 2+ on the right side.       Posterior tibial pulses are 2+ on the right side.  Pulmonary:     Effort: Pulmonary effort is normal.  Musculoskeletal:     Cervical back: Neck supple.     Comments: Pain, tenderness, mild swelling proximal to the right lateral malleolus.  No noted deformity, instability, erythema, increased warmth.  No tenderness, swelling, or pain to the right foot, the rest of the right lower leg, or to the knee.  Skin:    General: Skin is warm and dry.     Capillary Refill: Capillary refill takes less than 2 seconds.     Coloration: Skin is not pale.  Neurological:     Mental Status: She is alert.     Comments: Sensation light touch grossly intact in the right toes and foot. Flexion and extension intact in the right ankle.  Psychiatric:        Behavior: Behavior normal.    ED Results / Procedures / Treatments   Labs (all labs ordered are listed, but only abnormal results are displayed) Labs Reviewed - No data to display  EKG None  Radiology DG Ankle Complete Right  Result Date: 06/29/2021 CLINICAL DATA:  Right ankle pain for weeks. Surgery in 2018. No recent injury. EXAM: RIGHT ANKLE - COMPLETE 3+ VIEW COMPARISON:  Preoperative radiographs March of 2018. FINDINGS: Lateral plate and screw fixation as well as inter fragmentary screw traverse remote fibular fracture. The fracture is healed  without residual fracture lucency. There is no periprosthetic lucency or new fracture. The ankle mortise is preserved. No erosion, periosteal reaction or bone destruction. Mild soft tissue edema laterally. IMPRESSION: 1. Soft tissue edema laterally. No acute osseous abnormality. 2. Prior ORIF of remote distal fibular fracture, no hardware complication. Previous fracture has healed. Electronically Signed   By: Narda Rutherford M.D.   On: 06/29/2021 17:11    Procedures Procedures   Medications  Ordered in ED Medications - No data to display  ED Course  I have reviewed the triage vital signs and the nursing notes.  Pertinent labs & imaging results that were available during my care of the patient were reviewed by me and considered in my medical decision making (see chart for details).  Clinical Course as of 06/29/21 2052  Mon Jun 29, 2021  1946 Still not in the bed.  [SJ]    Clinical Course User Index [SJ] Yuya Vanwingerden C, PA-C   MDM Rules/Calculators/A&P                          Patient presents with right lower leg pain that seems to be atraumatic in origin. My suspicion for septic arthritis is low. I reviewed and interpreted her x-ray. No acute abnormalities were noted. Patient was placed in a boot and given crutches for comfort.  Orthopedic follow-up. The patient was given instructions for home care as well as return precautions. Patient voices understanding of these instructions, accepts the plan, and is comfortable with discharge.    Final Clinical Impression(s) / ED Diagnoses Final diagnoses:  Acute right ankle pain    Rx / DC Orders ED Discharge Orders     None        Concepcion Living 06/29/21 2053    Charlynne Pander, MD 06/29/21 [phone]

## 2021-06-29 NOTE — ED Notes (Signed)
Ortho called for CAM boot and crutches 

## 2021-06-29 NOTE — ED Notes (Signed)
Patient verbalized understanding of discharge instructions. Opportunity for questions and answers.  

## 2021-06-29 NOTE — Progress Notes (Signed)
Orthopedic Tech Progress Note Patient Details:  Phelan Goers 2001/05/07 [id number]   Ortho Devices Type of Ortho Device: CAM walker, Crutches Ortho Device/Splint Location: RLE Ortho Device/Splint Interventions: Application, Ordered, Adjustment   Post Interventions Patient Tolerated: Well, Ambulated well Instructions Provided: Care of device, Adjustment of device, Poper ambulation with device  Docia Furl 06/29/2021, 9:11 PM

## 2021-06-29 NOTE — Discharge Instructions (Addendum)
You have been seen today for ankle pain. There were no acute abnormalities on the x-rays, including no sign of fracture or dislocation, however, there could be injuries to the soft tissues, such as the ligaments or tendons that are not seen on xrays. There could also be what are called occult fractures that are small fractures not seen on xray. Antiinflammatory medications: Take 600 mg of ibuprofen every 6 hours or 440 mg (over the counter dose) to 500 mg (prescription dose) of naproxen every 12 hours for the next 3 days. After this time, these medications may be used as needed for pain. Take these medications with food to avoid upset stomach. Choose only one of these medications, do not take them together. Acetaminophen (generic for Tylenol): Should you continue to have additional pain while taking the ibuprofen or naproxen, you may add in acetaminophen as needed. Your daily total maximum amount of acetaminophen from all sources should be limited to 4000mg /day for persons without liver problems, or 2000mg /day for those with liver problems. Ice: May apply ice to the area over the next 24 hours for 15 minutes at a time to reduce swelling. Elevation: Keep the extremity elevated as often as possible to reduce pain and inflammation. Support: Wear the cam boot for support and comfort. Wear this until pain resolves. You will be weight-bearing as tolerated, which means you can slowly start to put weight on the extremity and increase amount and frequency as pain allows. Follow up: If symptoms are improving, you may follow up with your primary care provider for any continued management. If symptoms are not starting to improve within a week, you should follow up with the orthopedic specialist within two weeks. Return: Return to the ED for numbness, weakness, increasing pain, overall worsening symptoms, loss of function, or if symptoms are not improving, you have tried to follow up with the orthopedic specialist, and  have been unable to do so.  For prescription assistance, may try using prescription discount sites or apps, such as goodrx.com or Good Rx smart phone app.

## 2021-06-29 NOTE — ED Provider Notes (Signed)
Emergency Medicine Provider Triage Evaluation Note  Kim Mejia , a 20 y.o. female  was evaluated in triage.  Pt complains of right ankle pain.  Noticed some pain and swelling on the lateral portion of the right ankle on Friday, was at the beach this weekend and may have injured it when she was out in the ocean and got knocked over but is not sure.  She has had prior surgery on the right ankle in 2018 for fracture.  Swelling at the top portion of the scar, no redness, no bug bites or wounds that she has noted.  Pain worse with weightbearing  Review of Systems  Positive: Ankle pain, swelling Negative: Redness, warmth, fever, rash  Physical Exam  BP 109/70 (BP Location: Right Arm)   Pulse 72   Temp 98.6 F (37 C) (Oral)   Resp 18   LMP 06/15/2021   SpO2 100%  Gen:   Awake, no distress   Resp:  Normal effort  MSK:   Right ankle with swelling over the lateral aspect over the top portion of surgical scar, distal pulses 2+ Other:    Medical Decision Making  Medically screening exam initiated at 4:41 PM.  Appropriate orders placed.  Kim Mejia was informed that the remainder of the evaluation will be completed by another provider, this initial triage assessment does not replace that evaluation, and the importance of remaining in the ED until their evaluation is complete.     Dartha Lodge, PA-C 06/29/21 1644    Mancel Bale, MD 06/29/21 [phone]

## 2021-06-29 NOTE — ED Triage Notes (Signed)
Pt c/o right ankle pain and swelling that started on Friday. Hx fx and surgery to that ankle. Pt denies fall, unsure if she injured it while at the beach.

## 2021-06-30 ENCOUNTER — Ambulatory Visit: Payer: Medicaid Other | Admitting: Family

## 2021-07-01 ENCOUNTER — Ambulatory Visit: Payer: Medicaid Other | Admitting: Orthopaedic Surgery

## 2021-07-01 ENCOUNTER — Encounter: Payer: Self-pay | Admitting: Orthopaedic Surgery

## 2021-07-01 DIAGNOSIS — M25571 Pain in right ankle and joints of right foot: Secondary | ICD-10-CM

## 2021-07-01 MED ORDER — NAPROXEN 500 MG PO TABS
500.0000 mg | ORAL_TABLET | Freq: Two times a day (BID) | ORAL | 3 refills | Status: DC
Start: 2021-07-01 — End: 2021-07-31

## 2021-07-01 NOTE — Progress Notes (Signed)
Office Visit Note   Patient: Kim Mejia           Date of Birth: 30-May-2001           MRN: [id number] Visit Date: 07/01/2021              Requested by: No referring provider defined for this encounter. PCP: Pcp, No   Assessment & Plan: Visit Diagnoses:  1. Pain in right ankle and joints of right foot     Plan: Based on findings my impression is that she has some irritation of the soft tissues from either overactivity or from the underlying hardware or potentially combination of both.  I do not see any evidence of an infection however I explained to her the signs symptoms to be aware of.  For now I would recommend continued immobilization in the cam boot until she begins to feel better.  She does not feel like she needs the crutches.  I have sent in a prescription for naproxen.  She will use ice pack to this area 30 minutes at a time.  She will follow-up with [place] in a couple weeks if she does not feel any improvement.  Follow-Up Instructions: Return if symptoms worsen or fail to improve.   Orders:  No orders of the defined types were placed in this encounter.  Meds ordered this encounter  Medications   naproxen (NAPROSYN) 500 MG tablet    Sig: Take 1 tablet (500 mg total) by mouth 2 (two) times daily with a meal.    Dispense:  30 tablet    Refill:  3      Procedures: No procedures performed   Clinical Data: No additional findings.   Subjective: Chief Complaint  Patient presents with   Right Ankle - Pain    Patient is a 20 year old female who comes in for evaluation of right ankle pain since this past Friday about 5 days ago.  She had no prior injuries.  Denies any constitutional symptoms.  She was at the beach this weekend and did a lot of walking and standing.  She did have an ORIF right lateral malleolus fracture in 2018 which went well.  She has not had any problems with this.  She has noticed some swelling around the top portion of the surgical scar.  She has  not noticed any redness or cellulitis.   Review of Systems  Constitutional: Negative.   HENT: Negative.    Eyes: Negative.   Respiratory: Negative.    Cardiovascular: Negative.   Endocrine: Negative.   Musculoskeletal: Negative.   Neurological: Negative.   Hematological: Negative.   Psychiatric/Behavioral: Negative.    All other systems reviewed and are negative.   Objective: Vital Signs: LMP 06/15/2021   Physical Exam Vitals and nursing note reviewed.  Constitutional:      Appearance: She is well-developed.  Pulmonary:     Effort: Pulmonary effort is normal.  Skin:    General: Skin is warm.     Capillary Refill: Capillary refill takes less than 2 seconds.  Neurological:     Mental Status: She is alert and oriented to person, place, and time.  Psychiatric:        Behavior: Behavior normal.        Thought Content: Thought content normal.        Judgment: Judgment normal.    Ortho Exam Right ankle shows a fully healed surgical scar.  There is mild focal swelling at the proximal extent of the  surgical scar.  Motor or sensory function of all intact.  She has tenderness near the swollen area.  Peroneal tendon function is normal without pain. Specialty Comments:  No specialty comments available.  Imaging: No results found.   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   UTI in pregnancy, antepartum, first trimester 10/15/2019   Supervision of other normal pregnancy, antepartum 10/02/2019   BMI (body mass index), pediatric, 95-99% for age 47/25/2016   Language barrier 03/21/2015   Pain of left calf 08/21/2013   Past Medical History:  Diagnosis  Date   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   Was evaluated by cardiology,  To have further testing, possible popliteal entrapment, consult with vascular surgery, or ortho if results neg    Closed fracture of distal fibula 03/10/2017   Right - broken when kicked during soccer game   Obesity     Family History  Problem Relation Age of Onset   Hyperlipidemia Mother     Past Surgical History:  Procedure Laterality Date   ORIF FIBULA FRACTURE Right 03/14/2017   Procedure: RIGHT OPEN REDUCTION INTERNAL FIXATION LATERAL MALLEOLUS;  Surgeon: Eldred Manges, MD;  Location: [facility] OR;  Service: Orthopedics;  Laterality: Right;   Social History   Occupational History   Occupation: unemployed  Tobacco Use   Smoking status: Never   Smokeless tobacco: Never   Tobacco comments:    father smokes, visits on weekends  Vaping Use   Vaping Use: Never used  Substance and Sexual Activity   Alcohol use: No    Alcohol/week: 0.0 standard drinks   Drug use: No   Sexual activity: Not Currently

## 2021-07-06 ENCOUNTER — Encounter: Payer: Self-pay | Admitting: Pediatrics

## 2021-07-07 ENCOUNTER — Other Ambulatory Visit: Payer: Self-pay

## 2021-07-07 ENCOUNTER — Ambulatory Visit (INDEPENDENT_AMBULATORY_CARE_PROVIDER_SITE_OTHER): Payer: Medicaid Other

## 2021-07-07 VITALS — BP 104/70 | HR 82 | Ht 62.0 in | Wt 242.0 lb

## 2021-07-07 DIAGNOSIS — Z3201 Encounter for pregnancy test, result positive: Secondary | ICD-10-CM | POA: Diagnosis not present

## 2021-07-07 LAB — POCT URINE PREGNANCY: Preg Test, Ur: POSITIVE — AB

## 2021-07-07 NOTE — Progress Notes (Signed)
Kim Mejia presents today for UPT. She has no unusual complaints.  LMP: 05/24/2021    OBJECTIVE: Appears well, in no apparent distress.  OB History     Gravida  2   Para  1   Term  1   Preterm      AB      Living  1      SAB      IAB      Ectopic      Multiple  0   Live Births  1          Home UPT Result:POSITIVE In-Office UPT result: POSITIVE  I have reviewed the patient's medical, obstetrical, social, and family histories, and medications.   ASSESSMENT: Positive pregnancy test, no complaints. LMP 05/24/2021 EDD 02/28/2022 [place]    [redacted]w[redacted]d   PLAN Prenatal care to be completed at: [facility]

## 2021-07-31 ENCOUNTER — Other Ambulatory Visit: Payer: Self-pay

## 2021-07-31 ENCOUNTER — Ambulatory Visit (INDEPENDENT_AMBULATORY_CARE_PROVIDER_SITE_OTHER): Payer: Medicaid Other

## 2021-07-31 VITALS — BP 113/76 | HR 72 | Ht <= 58 in | Wt 242.0 lb

## 2021-07-31 DIAGNOSIS — O3680X0 Pregnancy with inconclusive fetal viability, not applicable or unspecified: Secondary | ICD-10-CM

## 2021-07-31 DIAGNOSIS — Z3A08 8 weeks gestation of pregnancy: Secondary | ICD-10-CM | POA: Diagnosis not present

## 2021-07-31 DIAGNOSIS — Z3481 Encounter for supervision of other normal pregnancy, first trimester: Secondary | ICD-10-CM | POA: Diagnosis not present

## 2021-07-31 DIAGNOSIS — Z3491 Encounter for supervision of normal pregnancy, unspecified, first trimester: Secondary | ICD-10-CM | POA: Insufficient documentation

## 2021-07-31 NOTE — Progress Notes (Signed)
Patient was assessed and managed by nursing staff during this encounter. I have reviewed the chart and agree with the documentation and plan. I have also made any necessary editorial changes.  Jacy Howat, MD 07/31/2021 11:14 AM   

## 2021-07-31 NOTE — Progress Notes (Signed)
New OB Intake  I connected with  [name] on 07/31/21 at  8:15 AM EDT by in person. Video Visit and verified that I am speaking with the correct person using two identifiers. Nurse is located at [facility] and pt is located at [place].  I discussed the limitations, risks, security and privacy concerns of performing an evaluation and management service by telephone and the availability of in person appointments. I also discussed with the patient that there may be a patient responsible charge related to this service. The patient expressed understanding and agreed to proceed.  I explained I am completing New OB Intake today. We discussed her EDD of 03/10/22 that is based on early u/s. Pt is G2/P1001. I reviewed her allergies, medications, Medical/Surgical/OB history, and appropriate screenings. I informed her of [facility] services. Based on history, this is a/an  pregnancy uncomplicated .   Patient Active Problem List   Diagnosis Date Noted   Indication for care or intervention in labor or delivery 05/11/2020   Gestational hypertension 05/11/2020   Group B Streptococcus carrier, +RV culture, currently pregnant 04/20/2020   Anemia in pregnancy 02/11/2020   Obesity in pregnancy 12/04/2019   Cystic fibrosis carrier, 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   UTI in pregnancy, antepartum, first trimester 10/15/2019   Supervision of other normal pregnancy, antepartum 10/02/2019   BMI (body mass index), pediatric, 95-99% for age 38/25/2016   Language barrier 03/21/2015   Pain of left calf 08/21/2013    Concerns addressed today  Delivery Plans:  Plans to deliver at [facility].   MyChart/Babyscripts MyChart access verified. I explained pt will have some visits in office and some virtually. Babyscripts instructions given and order placed. Patient verifies receipt of registration text/e-mail. Account successfully created and app downloaded.  Blood Pressure Cuff  Patient still has blood pressure cuff at home from last  pregnancy. Explained after first prenatal appt pt will check weekly and document in Babyscripts.  Weight scale: Patient states that she will purchase weight scale.  Anatomy US Explained first scheduled [place] will be around 19 weeks. Dating and viability scan performed today.  Labs Discussed Avelina Laine genetic screening with patient. Would like both Panorama and Horizon drawn at new OB visit. Routine prenatal labs needed.  Covid Vaccine Patient has not covid vaccine.   Mother/ Baby Dyad Candidate?    If yes, offer as possibility  Informed patient of [facility] website  and placed link in her AVS.   Social Determinants of Health Food Insecurity: Patient denies food insecurity. WIC Referral: Patient is interested in referral to [facility].  Transportation: Patient denies transportation needs. Childcare: Discussed no children allowed at ultrasound appointments. Offered childcare services; patient declines childcare services at this time.   Placed OB Box on problem list and updated  First visit review I reviewed new OB appt with pt. I explained she will have a pelvic exam, ob bloodwork with genetic screening, and PAP smear. Explained pt will be seen by Leroy Libman at first visit; encounter routed to appropriate provider. Explained that patient will be seen by pregnancy navigator following visit with provider. [facility] information placed in AVS.   Hamilton Capri, RN 07/31/2021  8:24 AM

## 2021-08-06 ENCOUNTER — Encounter: Payer: Medicaid Other | Admitting: Obstetrics and Gynecology

## 2021-08-14 ENCOUNTER — Ambulatory Visit (INDEPENDENT_AMBULATORY_CARE_PROVIDER_SITE_OTHER): Payer: Medicaid Other | Admitting: Obstetrics and Gynecology

## 2021-08-14 ENCOUNTER — Encounter: Payer: Self-pay | Admitting: Obstetrics and Gynecology

## 2021-08-14 ENCOUNTER — Other Ambulatory Visit (HOSPITAL_COMMUNITY)
Admission: RE | Admit: 2021-08-14 | Discharge: 2021-08-14 | Disposition: A | Discharge status: Home / Self Care | Payer: Medicaid Other | Source: Ambulatory Visit | Attending: Obstetrics and Gynecology | Admitting: Obstetrics and Gynecology

## 2021-08-14 ENCOUNTER — Other Ambulatory Visit: Payer: Self-pay

## 2021-08-14 VITALS — BP 106/63 | HR 79 | Ht 64.0 in | Wt 241.5 lb

## 2021-08-14 DIAGNOSIS — Z87440 Personal history of urinary (tract) infections: Secondary | ICD-10-CM

## 2021-08-14 DIAGNOSIS — Z789 Other specified health status: Secondary | ICD-10-CM

## 2021-08-14 DIAGNOSIS — Z3A10 10 weeks gestation of pregnancy: Secondary | ICD-10-CM | POA: Diagnosis not present

## 2021-08-14 DIAGNOSIS — Z8759 Personal history of other complications of pregnancy, childbirth and the puerperium: Secondary | ICD-10-CM | POA: Insufficient documentation

## 2021-08-14 DIAGNOSIS — Z3480 Encounter for supervision of other normal pregnancy, unspecified trimester: Secondary | ICD-10-CM

## 2021-08-14 DIAGNOSIS — Z141 Cystic fibrosis carrier: Secondary | ICD-10-CM

## 2021-08-14 DIAGNOSIS — O09899 Supervision of other high risk pregnancies, unspecified trimester: Secondary | ICD-10-CM | POA: Insufficient documentation

## 2021-08-14 MED ORDER — ASPIRIN EC 81 MG PO TBEC: 81.0000 mg | DELAYED_RELEASE_TABLET | Freq: Every day | ORAL | 2 refills | Status: DC

## 2021-08-14 NOTE — Addendum Note (Signed)
Addended by: Harrel Lemon on: 08/14/2021 10:11 AM   Modules accepted: Orders

## 2021-08-14 NOTE — Patient Instructions (Signed)

## 2021-08-14 NOTE — Progress Notes (Signed)
Subjective:  Kim Mejia is a 20 y.o. G2P1001 at [redacted]w[redacted]d being seen today for her first OB appt. EDD by LMP. H/O TSVD x 1 complicated by GHTN. No BP meds presently.    She is currently monitored for the following issues for this low-risk pregnancy and has BMI (body mass index), pediatric, 95-99% for age; Language barrier; Supervision of other normal pregnancy, antepartum; Cystic fibrosis carrier, antepartum; Obesity in pregnancy; History of gestational hypertension; and History of GBS (group B streptococcus) UTI, currently pregnant on their problem list.  Patient reports no complaints.   . Vag. Bleeding: None.   . Denies leaking of fluid.   The following portions of the patient's history were reviewed and updated as appropriate: allergies, current medications, past family history, past medical history, past social history, past surgical history and problem list. Problem list updated.  Objective:   Vitals:   08/14/21 0816 08/14/21 0836  BP: 106/63   Pulse: 79   Weight: 241 lb 8 oz (109.5 kg)   Height:  5\' 4"  (1.626 m)    Fetal Status:           General:  Alert, oriented and cooperative. Patient is in no acute distress.  Skin: Skin is warm and dry. No rash noted.   Cardiovascular: Normal heart rate noted  Respiratory: Normal respiratory effort, no problems with respiration noted  Abdomen: Soft, gravid, appropriate for gestational age. Pain/Pressure: Absent     Pelvic:  Cervical exam performed        Extremities: Normal range of motion.  Edema: None  Mental Status: Normal mood and affect. Normal behavior. Normal judgment and thought content.   Urinalysis:      Assessment and Plan:  Pregnancy: G2P1001 at [redacted]w[redacted]d  1. Supervision of other normal pregnancy, antepartum Prenatal care and labs reviewed with pt Genetic test at next visit - Cytology - PAP - CBC/D/Plt+RPR+Rh+ABO+RubIgG... - Culture, OB Urine - Hemoglobin A1c - Comprehensive metabolic panel - [redacted]w[redacted]d MFM OB COMP + 14 WK;  Future  2. Cystic fibrosis carrier, antepartum Stable  3. History of gestational hypertension Instructed to monitor BP weekly. Advised to sign up for Baby Script Indication for BASA reviewed with pt. - Protein / creatinine ratio, urine - aspirin EC 81 MG tablet; Take 1 tablet (81 mg total) by mouth daily. Take after 12 weeks for prevention of preeclampsia later in pregnancy  Dispense: 300 tablet; Refill: 2  4. History of GBS (group B streptococcus) UTI, currently pregnant   Preterm labor symptoms and general obstetric precautions including but not limited to vaginal bleeding, contractions, leaking of fluid and fetal movement were reviewed in detail with the patient. Please refer to After Visit Summary for other counseling recommendations.  Return in about 4 weeks (around 09/11/2021) for OB visit, face to face, any provider.   09/13/2021, MD

## 2021-08-14 NOTE — Progress Notes (Signed)
New OB 10.2 weeks OB panel OB urine culture PAP and GC/CC Hx GHTN, CMP pended Obesity in pregnancy, HgA1C pended  Some nausea but is able to eat. Has not picked up BP cuff yet, instructed to pick up.

## 2021-08-16 LAB — URINE CULTURE, OB REFLEX

## 2021-08-16 LAB — CULTURE, OB URINE

## 2021-08-17 LAB — CYTOLOGY - PAP: Diagnosis: NEGATIVE

## 2021-08-17 LAB — CERVICOVAGINAL ANCILLARY ONLY
Chlamydia: NEGATIVE
Comment: NEGATIVE
Comment: NORMAL
Neisseria Gonorrhea: NEGATIVE

## 2021-08-18 LAB — CBC/D/PLT+RPR+RH+ABO+RUBIGG...
Antibody Screen: NEGATIVE
Basophils Absolute: 0 10*3/uL (ref 0.0–0.2)
Basos: 1 %
EOS (ABSOLUTE): 0.1 10*3/uL (ref 0.0–0.4)
Eos: 1 %
HCV Ab: <0.1 s/co ratio (ref 0.0–0.9)
HIV Screen 4th Generation wRfx: NONREACTIVE
Hematocrit: 37.1 % (ref 34.0–46.6)
Hemoglobin: 12 g/dL (ref 11.1–15.9)
Hepatitis B Surface Ag: NEGATIVE
Immature Grans (Abs): 0 10*3/uL (ref 0.0–0.1)
Immature Granulocytes: 0 %
Lymphocytes Absolute: 1.5 10*3/uL (ref 0.7–3.1)
Lymphs: 20 %
MCH: 29.1 pg (ref 26.6–33.0)
MCHC: 32.3 g/dL (ref 31.5–35.7)
MCV: 90 fL (ref 79–97)
Monocytes Absolute: 0.5 10*3/uL (ref 0.1–0.9)
Monocytes: 7 %
Neutrophils Absolute: 5.3 10*3/uL (ref 1.4–7.0)
Neutrophils: 71 %
Platelets: 276 10*3/uL (ref 150–450)
RBC: 4.13 x10E6/uL (ref 3.77–5.28)
RDW: 13.9 % (ref 11.7–15.4)
RPR Ser Ql: NONREACTIVE
Rh Factor: POSITIVE
Rubella Antibodies, IGG: 1.81 index (ref >0.99)
WBC: 7.5 10*3/uL (ref 3.4–10.8)

## 2021-08-18 LAB — COMPREHENSIVE METABOLIC PANEL
ALT: 8 IU/L (ref 0–32)
AST: 11 IU/L (ref 0–40)
Albumin/Globulin Ratio: 1.4 (ref 1.2–2.2)
Albumin: 3.9 g/dL (ref 3.9–5.0)
Alkaline Phosphatase: 98 IU/L (ref 44–121)
BUN/Creatinine Ratio: 9 (ref 9–23)
BUN: 5 mg/dL — ABNORMAL LOW (ref 6–20)
Bilirubin Total: 0.3 mg/dL (ref 0.0–1.2)
CO2: 21 mmol/L (ref 20–29)
Calcium: 8.9 mg/dL (ref 8.7–10.2)
Chloride: 102 mmol/L (ref 96–106)
Creatinine, Ser: 0.58 mg/dL (ref 0.57–1.00)
Globulin, Total: 2.7 g/dL (ref 1.5–4.5)
Glucose: 89 mg/dL (ref 65–99)
Potassium: 3.8 mmol/L (ref 3.5–5.2)
Sodium: 138 mmol/L (ref 134–144)
Total Protein: 6.6 g/dL (ref 6.0–8.5)
eGFR: 132 mL/min/{1.73_m2} (ref >59)

## 2021-08-18 LAB — HEMOGLOBIN A1C
Est. average glucose Bld gHb Est-mCnc: 103 mg/dL
Hgb A1c MFr Bld: 5.2 % (ref 4.8–5.6)

## 2021-08-18 LAB — PROTEIN / CREATININE RATIO, URINE
Creatinine, Urine: 273 mg/dL
Protein, Ur: 19 mg/dL
Protein/Creat Ratio: 70 mg/g creat (ref 0–200)

## 2021-08-18 LAB — HCV INTERPRETATION

## 2021-09-11 ENCOUNTER — Ambulatory Visit (INDEPENDENT_AMBULATORY_CARE_PROVIDER_SITE_OTHER): Payer: Medicaid Other

## 2021-09-11 ENCOUNTER — Other Ambulatory Visit: Payer: Self-pay

## 2021-09-11 VITALS — BP 113/74 | HR 76 | Wt 239.0 lb

## 2021-09-11 DIAGNOSIS — Z3480 Encounter for supervision of other normal pregnancy, unspecified trimester: Secondary | ICD-10-CM

## 2021-09-11 DIAGNOSIS — O99210 Obesity complicating pregnancy, unspecified trimester: Secondary | ICD-10-CM

## 2021-09-11 DIAGNOSIS — Z3A14 14 weeks gestation of pregnancy: Secondary | ICD-10-CM

## 2021-09-11 NOTE — Progress Notes (Signed)
   HIGH-RISK PREGNANCY OFFICE VISIT  Patient name: Kim Mejia MRN [id number]  Date of birth: 2001/10/20 Chief Complaint:   Routine Prenatal Visit  Subjective:   Kim Mejia is a 20 y.o. G60P1001 female at [redacted]w[redacted]d with an Estimated Date of Delivery: 03/10/22 being seen today for ongoing management of a high-risk pregnancy aeb has BMI (body mass index), pediatric, 95-99% for age; Supervision of other normal pregnancy, antepartum; Cystic fibrosis carrier, antepartum; Obesity in pregnancy; History of gestational hypertension; and History of GBS (group B streptococcus) UTI, currently pregnant on their problem list.  Patient presents today with no complaints.  Patient is not yet experiencing fetal movement. Patient denies abdominal cramping or contractions.  Patient denies vaginal concerns including abnormal discharge, leaking of fluid, and bleeding.  Contractions: Not present. Vag. Bleeding: None.   .  Reviewed past medical,surgical, social, obstetrical and family history as well as problem list, medications and allergies.  Objective   Vitals:   09/11/21 0913  BP: 113/74  Pulse: 76  Weight: 239 lb (108.4 kg)  Body mass index is 41.02 kg/m.  Total Weight Gain:-1 lb (-0.454 kg)         Physical Examination:   General appearance: Well appearing, and in no distress  Mental status: Alert, oriented to person, place, and time  Skin: Warm & dry  Cardiovascular: Normal heart rate noted  Respiratory: Normal respiratory effort, no distress  Abdomen: Soft, gravid, nontender  Pelvic: Cervical exam deferred           Extremities: Edema: None  Fetal Status: Fetal Heart Rate (bpm): 156      No results found for this or any previous visit (from the past 24 hour(s)).  Assessment & Plan:  Loloww-risk pregnancy of a 20 y.o., G2P1001 at [redacted]w[redacted]d with an Estimated Date of Delivery: 03/10/22   1. Supervision of other normal pregnancy, antepartum -Anticipatory guidance for upcoming appts. -Patient to  schedule next appt in 5-6 weeks for an in-person visit.  2. [redacted] weeks gestation of pregnancy -Doing well overall -Genetic Screening to be completed today. -Informed that AFP to be collected at next visit.   3. Obesity in pregnancy -Has lost one pound -Taking baby aspirin -BP normotensive    Meds: No orders of the defined types were placed in this encounter.  Labs/procedures today:  Lab Orders  No laboratory test(s) ordered today     Reviewed: Preterm labor symptoms and general obstetric precautions including but not limited to vaginal bleeding, contractions, leaking of fluid and fetal movement were reviewed in detail with the patient.  All questions were answered.  Follow-up: No follow-ups on file.  No orders of the defined types were placed in this encounter.  Cherre Robins MSN, CNM 09/11/2021

## 2021-09-11 NOTE — Progress Notes (Signed)
Pt denies pain today

## 2021-09-14 ENCOUNTER — Telehealth: Payer: Self-pay

## 2021-09-14 NOTE — Telephone Encounter (Signed)
Received information from [place] that sample age exceeded max number of days draw date and date the specimen was received. Spoke with Thressa Sheller, representative for [employer], and changed the information on patient's requisition to update the correct information. Pt was originally seen for NOB on 08/14/21 and Panorama was not drawn due to elevated BMI. Panorama was deferred to next OV which was scheduled on 09/11/21 when patient was [redacted]w[redacted]d to ensure that there were enough cells for collection. Original requisition was not updated to reflect the new information. Information was updated with Lillia Abed verbally over the telephone to ensure that blood samples could be ran.

## 2021-09-17 ENCOUNTER — Telehealth: Payer: Self-pay

## 2021-09-17 NOTE — Telephone Encounter (Signed)
Left message for patient to call office to rescheduled 10/19 ultrasound appointment.

## 2021-09-21 ENCOUNTER — Encounter: Payer: Self-pay | Admitting: Obstetrics and Gynecology

## 2021-10-14 ENCOUNTER — Ambulatory Visit: Payer: Medicaid Other | Attending: Obstetrics and Gynecology

## 2021-10-14 ENCOUNTER — Other Ambulatory Visit: Payer: Self-pay

## 2021-10-14 ENCOUNTER — Ambulatory Visit: Payer: Medicaid Other

## 2021-10-14 ENCOUNTER — Other Ambulatory Visit: Payer: Self-pay | Admitting: Obstetrics and Gynecology

## 2021-10-14 DIAGNOSIS — Z3480 Encounter for supervision of other normal pregnancy, unspecified trimester: Secondary | ICD-10-CM

## 2021-10-15 ENCOUNTER — Other Ambulatory Visit: Payer: Self-pay | Admitting: *Deleted

## 2021-10-15 DIAGNOSIS — Z6841 Body Mass Index (BMI) 40.0 and over, adult: Secondary | ICD-10-CM

## 2021-10-16 ENCOUNTER — Encounter: Payer: Medicaid Other | Admitting: Obstetrics and Gynecology

## 2021-10-16 DIAGNOSIS — Z3480 Encounter for supervision of other normal pregnancy, unspecified trimester: Secondary | ICD-10-CM

## 2021-10-26 ENCOUNTER — Ambulatory Visit (INDEPENDENT_AMBULATORY_CARE_PROVIDER_SITE_OTHER): Payer: Medicaid Other

## 2021-10-26 ENCOUNTER — Other Ambulatory Visit: Payer: Self-pay

## 2021-10-26 ENCOUNTER — Encounter: Payer: Self-pay | Admitting: Obstetrics and Gynecology

## 2021-10-26 VITALS — BP 110/68 | HR 76 | Wt 243.0 lb

## 2021-10-26 DIAGNOSIS — Z3481 Encounter for supervision of other normal pregnancy, first trimester: Secondary | ICD-10-CM

## 2021-10-26 DIAGNOSIS — Z8759 Personal history of other complications of pregnancy, childbirth and the puerperium: Secondary | ICD-10-CM

## 2021-10-26 DIAGNOSIS — O99210 Obesity complicating pregnancy, unspecified trimester: Secondary | ICD-10-CM

## 2021-10-26 DIAGNOSIS — Z3A20 20 weeks gestation of pregnancy: Secondary | ICD-10-CM

## 2021-10-26 NOTE — Progress Notes (Signed)
ROB/AFP.  Reports no problems today. °

## 2021-10-26 NOTE — Progress Notes (Signed)
   PRENATAL VISIT NOTE  Subjective:  Kim Mejia is a 20 y.o. G2P1001 at [redacted]w[redacted]d being seen today for ongoing prenatal care.  She is currently monitored for the following issues for this high-risk pregnancy and has BMI (body mass index), pediatric, 95-99% for age; Supervision of other normal pregnancy, antepartum; Cystic fibrosis carrier, antepartum; Obesity in pregnancy; History of gestational hypertension; and History of GBS (group B streptococcus) UTI, currently pregnant on their problem list.  Patient reports no complaints. Has had intermittent pelvic pain when lying down at night for several weeks. This is similar to what she experienced with her previous pregnancy. Denies cramping, urinary symptoms, bleeding, or discharge.  Contractions: Not present. Vag. Bleeding: None.  Movement: Present. Denies leaking of fluid.   The following portions of the patient's history were reviewed and updated as appropriate: allergies, current medications, past family history, past medical history, past social history, past surgical history and problem list.   Objective:   Vitals:   10/26/21 1427  BP: 110/68  Pulse: 76  Weight: 243 lb (110.2 kg)    Fetal Status: Fetal Heart Rate (bpm): 152   Movement: Present     General:  Alert, oriented and cooperative. Patient is in no acute distress.  Skin: Skin is warm and dry. No rash noted.   Cardiovascular: Normal heart rate noted  Respiratory: Normal respiratory effort, no problems with respiration noted  Abdomen: Soft, gravid, appropriate for gestational age.  Pain/Pressure: Present     Pelvic: Cervical exam deferred        Extremities: Normal range of motion.  Edema: None  Mental Status: Normal mood and affect. Normal behavior. Normal judgment and thought content.   Assessment and Plan:  Pregnancy: G2P1001 at [redacted]w[redacted]d 1. Encounter for supervision of other normal pregnancy in first trimester - Routine OB care. AFP today - Doing well. No concerns. -  Reassurance regarding intermittent pelvic pressure provided. - Anticipatory guidance for upcoming appointments provided  - AFP, Serum, Open Spina Bifida  2. [redacted] weeks gestation of pregnancy - Endorses active fetal movement  3. Obesity in pregnancy - TWG 3lbs - Taking bASA  4. History of gestational hypertension - BP normotensive today   Preterm labor symptoms and general obstetric precautions including but not limited to vaginal bleeding, contractions, leaking of fluid and fetal movement were reviewed in detail with the patient. Please refer to After Visit Summary for other counseling recommendations.   Return in about 4 weeks (around 11/23/2021).  Future Appointments  Date Time Provider Department Center  11/18/2021  8:30 AM [facility] NURSE [facility]  11/18/2021  8:45 AM [facility]  11/23/2021 11:15 AM [facility], Wilmer Floor, CNM [facility] None     Brand Males, CNM 10/26/21 3:06 PM

## 2021-10-28 LAB — AFP, SERUM, OPEN SPINA BIFIDA
AFP MoM: 1.71
AFP Value: 81.2 ng/mL
Gest. Age on Collection Date: 20.5 weeks
Maternal Age At EDD: 22.3 yr
OSBR Risk 1 IN: 1590
Test Results:: NEGATIVE
Weight: 243 [lb_av]

## 2021-11-18 ENCOUNTER — Encounter: Payer: Self-pay | Admitting: *Deleted

## 2021-11-18 ENCOUNTER — Ambulatory Visit: Payer: Medicaid Other | Attending: Obstetrics

## 2021-11-18 ENCOUNTER — Other Ambulatory Visit: Payer: Self-pay

## 2021-11-18 ENCOUNTER — Other Ambulatory Visit: Payer: Self-pay | Admitting: *Deleted

## 2021-11-18 ENCOUNTER — Ambulatory Visit: Payer: Medicaid Other | Admitting: *Deleted

## 2021-11-18 VITALS — BP 100/55 | HR 70

## 2021-11-18 DIAGNOSIS — Z6841 Body Mass Index (BMI) 40.0 and over, adult: Secondary | ICD-10-CM

## 2021-11-18 DIAGNOSIS — E669 Obesity, unspecified: Secondary | ICD-10-CM

## 2021-11-18 DIAGNOSIS — O283 Abnormal ultrasonic finding on antenatal screening of mother: Secondary | ICD-10-CM

## 2021-11-18 DIAGNOSIS — Z3A24 24 weeks gestation of pregnancy: Secondary | ICD-10-CM | POA: Diagnosis not present

## 2021-11-18 DIAGNOSIS — R638 Other symptoms and signs concerning food and fluid intake: Secondary | ICD-10-CM

## 2021-11-18 DIAGNOSIS — Z362 Encounter for other antenatal screening follow-up: Secondary | ICD-10-CM | POA: Insufficient documentation

## 2021-11-18 DIAGNOSIS — O99212 Obesity complicating pregnancy, second trimester: Secondary | ICD-10-CM | POA: Insufficient documentation

## 2021-11-23 ENCOUNTER — Other Ambulatory Visit: Payer: Self-pay

## 2021-11-23 ENCOUNTER — Ambulatory Visit (INDEPENDENT_AMBULATORY_CARE_PROVIDER_SITE_OTHER): Payer: Medicaid Other | Admitting: Advanced Practice Midwife

## 2021-11-23 VITALS — BP 106/68 | HR 80 | Wt 246.0 lb

## 2021-11-23 DIAGNOSIS — Z3A24 24 weeks gestation of pregnancy: Secondary | ICD-10-CM

## 2021-11-23 DIAGNOSIS — Z8759 Personal history of other complications of pregnancy, childbirth and the puerperium: Secondary | ICD-10-CM

## 2021-11-23 DIAGNOSIS — Z3480 Encounter for supervision of other normal pregnancy, unspecified trimester: Secondary | ICD-10-CM

## 2021-11-23 DIAGNOSIS — Z3481 Encounter for supervision of other normal pregnancy, first trimester: Secondary | ICD-10-CM

## 2021-11-23 NOTE — Progress Notes (Signed)
   PRENATAL VISIT NOTE  Subjective:  Kim Mejia is a 20 y.o. G2P1001 at [redacted]w[redacted]d being seen today for ongoing prenatal care.  She is currently monitored for the following issues for this low-risk pregnancy and has BMI (body mass index), pediatric, 95-99% for age; Supervision of other normal pregnancy, antepartum; Cystic fibrosis carrier, antepartum; Obesity in pregnancy; History of gestational hypertension; and History of GBS (group B streptococcus) UTI, currently pregnant on their problem list.  Patient reports no complaints.  Contractions: Not present. Vag. Bleeding: None.  Movement: Present. Denies leaking of fluid.   The following portions of the patient's history were reviewed and updated as appropriate: allergies, current medications, past family history, past medical history, past social history, past surgical history and problem list.   Objective:   Vitals:   11/23/21 1113  BP: 106/68  Pulse: 80  Weight: 246 lb (111.6 kg)    Fetal Status: Fetal Heart Rate (bpm): 150   Movement: Present     General:  Alert, oriented and cooperative. Patient is in no acute distress.  Skin: Skin is warm and dry. No rash noted.   Cardiovascular: Normal heart rate noted  Respiratory: Normal respiratory effort, no problems with respiration noted  Abdomen: Soft, gravid, appropriate for gestational age.  Pain/Pressure: Absent     Pelvic: Cervical exam deferred        Extremities: Normal range of motion.  Edema: None  Mental Status: Normal mood and affect. Normal behavior. Normal judgment and thought content.   Assessment and Plan:  Pregnancy: G2P1001 at [redacted]w[redacted]d 1. Encounter for supervision of other normal pregnancy in first trimester --Anticipatory guidance about next visits/weeks of pregnancy given. --Next visit in 4 weeks for GTT  2. History of gestational hypertension --On BASA --BP wnl today  3. [redacted] weeks gestation of pregnancy    Preterm labor symptoms and general obstetric precautions  including but not limited to vaginal bleeding, contractions, leaking of fluid and fetal movement were reviewed in detail with the patient. Please refer to After Visit Summary for other counseling recommendations.   Return in about 4 weeks (around 12/21/2021).  Future Appointments  Date Time Provider Department Center  12/16/2021  7:30 AM [facility] NURSE [facility]  12/16/2021  7:45 AM [facility] US5 [facility]    Sharen Counter, CNM

## 2021-12-16 ENCOUNTER — Other Ambulatory Visit: Payer: Self-pay | Admitting: *Deleted

## 2021-12-16 ENCOUNTER — Encounter: Payer: Self-pay | Admitting: *Deleted

## 2021-12-16 ENCOUNTER — Ambulatory Visit: Payer: Medicaid Other | Attending: Obstetrics and Gynecology

## 2021-12-16 ENCOUNTER — Ambulatory Visit: Payer: Medicaid Other | Admitting: *Deleted

## 2021-12-16 ENCOUNTER — Other Ambulatory Visit: Payer: Self-pay

## 2021-12-16 VITALS — BP 98/57 | HR 69

## 2021-12-16 DIAGNOSIS — O99013 Anemia complicating pregnancy, third trimester: Secondary | ICD-10-CM | POA: Diagnosis not present

## 2021-12-16 DIAGNOSIS — Z8759 Personal history of other complications of pregnancy, childbirth and the puerperium: Secondary | ICD-10-CM

## 2021-12-16 DIAGNOSIS — Z3A28 28 weeks gestation of pregnancy: Secondary | ICD-10-CM

## 2021-12-16 DIAGNOSIS — R638 Other symptoms and signs concerning food and fluid intake: Secondary | ICD-10-CM | POA: Diagnosis not present

## 2021-12-16 DIAGNOSIS — O99213 Obesity complicating pregnancy, third trimester: Secondary | ICD-10-CM

## 2021-12-16 DIAGNOSIS — Z6841 Body Mass Index (BMI) 40.0 and over, adult: Secondary | ICD-10-CM | POA: Diagnosis not present

## 2021-12-22 ENCOUNTER — Ambulatory Visit (INDEPENDENT_AMBULATORY_CARE_PROVIDER_SITE_OTHER): Payer: Medicaid Other | Admitting: Family Medicine

## 2021-12-22 ENCOUNTER — Other Ambulatory Visit: Payer: Self-pay

## 2021-12-22 ENCOUNTER — Other Ambulatory Visit: Payer: Self-pay | Admitting: *Deleted

## 2021-12-22 ENCOUNTER — Other Ambulatory Visit: Payer: Medicaid Other

## 2021-12-22 VITALS — BP 111/72 | HR 80 | Wt 250.0 lb

## 2021-12-22 DIAGNOSIS — Z87440 Personal history of urinary (tract) infections: Secondary | ICD-10-CM

## 2021-12-22 DIAGNOSIS — O09899 Supervision of other high risk pregnancies, unspecified trimester: Secondary | ICD-10-CM

## 2021-12-22 DIAGNOSIS — Z3A28 28 weeks gestation of pregnancy: Secondary | ICD-10-CM

## 2021-12-22 DIAGNOSIS — Z141 Cystic fibrosis carrier: Secondary | ICD-10-CM

## 2021-12-22 DIAGNOSIS — O99213 Obesity complicating pregnancy, third trimester: Secondary | ICD-10-CM

## 2021-12-22 DIAGNOSIS — Z3480 Encounter for supervision of other normal pregnancy, unspecified trimester: Secondary | ICD-10-CM

## 2021-12-22 DIAGNOSIS — Z8759 Personal history of other complications of pregnancy, childbirth and the puerperium: Secondary | ICD-10-CM

## 2021-12-22 NOTE — Patient Instructions (Addendum)
Nexium, Protonix or Prilosec or Prevacid, take twice weekly  Third Trimester of Pregnancy The third trimester of pregnancy is from week 28 through week 40. This is months 7 through 9. The third trimester is a time when the unborn baby (fetus) is growing rapidly. At the end of the ninth month, the fetus is about 20 inches long and weighs 6-10 pounds. Body changes during your third trimester During the third trimester, your body will continue to go through many changes. The changes vary and generally return to normal after your baby is born. Physical changes Your weight will continue to increase. You can expect to gain 25-35 pounds (11-16 kg) by the end of the pregnancy if you begin pregnancy at a normal weight. If you are underweight, you can expect to gain 28-40 lb (about 13-18 kg), and if you are overweight, you can expect to gain 15-25 lb (about 7-11 kg). You may begin to get stretch marks on your hips, abdomen, and breasts. Your breasts will continue to grow and may hurt. A yellow fluid (colostrum) may leak from your breasts. This is the first milk you are producing for your baby. You may have changes in your hair. These can include thickening of your hair, rapid growth, and changes in texture. Some people also have hair loss during or after pregnancy, or hair that feels dry or thin. Your belly button may stick out. You may notice more swelling in your hands, face, or ankles. Health changes You may have heartburn. You may have constipation. You may develop hemorrhoids. You may develop swollen, bulging veins (varicose veins) in your legs. You may have increased body aches in the pelvis, back, or thighs. This is due to weight gain and increased hormones that are relaxing your joints. You may have increased tingling or numbness in your hands, arms, and legs. The skin on your abdomen may also feel numb. You may feel short of breath because of your expanding uterus. Other changes You may urinate  more often because the fetus is moving lower into your pelvis and pressing on your bladder. You may have more problems sleeping. This may be caused by the size of your abdomen, an increased need to urinate, and an increase in your body's metabolism. You may notice the fetus "dropping," or moving lower in your abdomen (lightening). You may have increased vaginal discharge. You may notice that you have pain around your pelvic bone as your uterus distends. Follow these instructions at home: Medicines Follow your health care provider's instructions regarding medicine use. Specific medicines may be either safe or unsafe to take during pregnancy. Do not take any medicines unless approved by your health care provider. Take a prenatal vitamin that contains at least 600 micrograms (mcg) of folic acid. Eating and drinking Eat a healthy diet that includes fresh fruits and vegetables, whole grains, good sources of protein such as meat, eggs, or tofu, and low-fat dairy products. Avoid raw meat and unpasteurized juice, milk, and cheese. These carry germs that can harm you and your baby. Eat 4 or 5 small meals rather than 3 large meals a day. You may need to take these actions to prevent or treat constipation: Drink enough fluid to keep your urine pale yellow. Eat foods that are high in fiber, such as beans, whole grains, and fresh fruits and vegetables. Limit foods that are high in fat and processed sugars, such as fried or sweet foods. Activity Exercise only as directed by your health care provider. Most people  can continue their usual exercise routine during pregnancy. Try to exercise for 30 minutes at least 5 days a week. Stop exercising if you experience contractions in the uterus. Stop exercising if you develop pain or cramping in the lower abdomen or lower back. Avoid heavy lifting. Do not exercise if it is very hot or humid or if you are at a high altitude. If you choose to, you may continue to have  sex unless your health care provider tells you not to. Relieving pain and discomfort Take frequent breaks and rest with your legs raised (elevated) if you have leg cramps or low back pain. Take warm sitz baths to soothe any pain or discomfort caused by hemorrhoids. Use hemorrhoid cream if your health care provider approves. Wear a supportive bra to prevent discomfort from breast tenderness. If you develop varicose veins: Wear support hose as told by your health care provider. Elevate your feet for 15 minutes, 3-4 times a day. Limit salt in your diet. Safety Talk to your health care provider before traveling far distances. Do not use hot tubs, steam rooms, or saunas. Wear your seat belt at all times when driving or riding in a car. Talk with your health care provider if someone is verbally or physically abusive to you. Preparing for birth To prepare for the arrival of your baby: Take prenatal classes to understand, practice, and ask questions about labor and delivery. Visit the hospital and tour the maternity area. Purchase a rear-facing car seat and make sure you know how to install it in your car. Prepare the baby's room or sleeping area. Make sure to remove all pillows and stuffed animals from the baby's crib to prevent suffocation. General instructions Avoid cat litter boxes and soil used by cats. These carry germs that can cause birth defects in the baby. If you have a cat, ask someone to clean the litter box for you. Do not douche or use tampons. Do not use scented sanitary pads. Do not use any products that contain nicotine or tobacco, such as cigarettes, e-cigarettes, and chewing tobacco. If you need help quitting, ask your health care provider. Do not use any herbal remedies, illegal drugs, or medicines that were not prescribed to you. Chemicals in these products can harm your baby. Do not drink alcohol. You will have more frequent prenatal exams during the third trimester. During a  routine prenatal visit, your health care provider will do a physical exam, perform tests, and discuss your overall health. Keep all follow-up visits. This is important. Where to find more information American Pregnancy Association: americanpregnancy.org [employer]ians [employer]necologists: https://www.todd-brady.net/ Office on [employer] Health: MightyReward.co.nz Contact a health care provider if you have: A fever. Mild pelvic cramps, pelvic pressure, or nagging pain in your abdominal area or lower back. Vomiting or diarrhea. Bad-smelling vaginal discharge or foul-smelling urine. Pain when you urinate. A headache that does not go away when you take medicine. Visual changes or see spots in front of your eyes. Get help right away if: Your water breaks. You have regular contractions less than 5 minutes apart. You have spotting or bleeding from your vagina. You have severe abdominal pain. You have difficulty breathing. You have chest pain. You have fainting spells. You have not felt your baby move for the time period told by your health care provider. You have new or increased pain, swelling, or redness in an arm or leg. Summary The third trimester of pregnancy is from week 28 through week 40 (  months 7 through 9). You may have more problems sleeping. This can be caused by the size of your abdomen, an increased need to urinate, and an increase in your body's metabolism. You will have more frequent prenatal exams during the third trimester. Keep all follow-up visits. This is important. This information is not intended to replace advice given to you by your health care provider. Make sure you discuss any questions you have with your health care provider. Document Revised: 05/21/2020 Document Reviewed: 03/27/2020 Elsevier Patient Education  2022 [employer].

## 2021-12-22 NOTE — Progress Notes (Signed)
ROB/GTT.  Pt declined FLU and TDAP vaccines.  C/o heartburns.

## 2021-12-22 NOTE — Progress Notes (Signed)
° ° °  PRENATAL VISIT NOTE  Subjective:  Kim Mejia is a 20 y.o. G2P1001 at [redacted]w[redacted]d being seen today for ongoing prenatal care.  She is currently monitored for the following issues for this low-risk pregnancy and has BMI (body mass index), pediatric, 95-99% for age; Supervision of other normal pregnancy, antepartum; Cystic fibrosis carrier, antepartum; Obesity in pregnancy; History of gestational hypertension; and History of GBS (group B streptococcus) UTI, currently pregnant on their problem list.  Patient reports heartburn.  Contractions: Not present. Vag. Bleeding: None.  Movement: Present. Denies leaking of fluid.   The following portions of the patient's history were reviewed and updated as appropriate: allergies, current medications, past family history, past medical history, past social history, past surgical history and problem list.   Objective:   Vitals:   12/22/21 0821  BP: 111/72  Pulse: 80  Weight: 250 lb (113.4 kg)    Fetal Status: Fetal Heart Rate (bpm): 147 Fundal Height: 31 cm Movement: Present     General:  Alert, oriented and cooperative. Patient is in no acute distress.  Skin: Skin is warm and dry. No rash noted.   Cardiovascular: Normal heart rate noted  Respiratory: Normal respiratory effort, no problems with respiration noted  Abdomen: Soft, gravid, appropriate for gestational age.  Pain/Pressure: Absent     Pelvic: Cervical exam deferred        Extremities: Normal range of motion.  Edema: None  Mental Status: Normal mood and affect. Normal behavior. Normal judgment and thought content.   Assessment and Plan:  Pregnancy: G2P1001 at [redacted]w[redacted]d 1. Supervision of other normal pregnancy, antepartum 28 wk labs today Declined flu and TDaP - Glucose Tolerance, 2 Hours w/1 Hour - CBC - RPR - HIV antibody (with reflex)  2. Cystic fibrosis carrier, antepartum   3. History of GBS (group B streptococcus) UTI, currently pregnant Will need treatment in  labor    Preterm labor symptoms and general obstetric precautions including but not limited to vaginal bleeding, contractions, leaking of fluid and fetal movement were reviewed in detail with the patient. Please refer to After Visit Summary for other counseling recommendations.   Return in 2 weeks (on 01/05/2022).  Future Appointments  Date Time Provider Department Center  01/06/2022  8:35 AM Currie Paris, NP [facility] None  01/12/2022  2:30 PM [facility] NURSE [facility]  01/12/2022  2:45 PM [facility] US5 [facility]  01/27/2022  9:15 AM [facility]ST [facility]  02/03/2022 10:15 AM [facility] NST [facility]  02/10/2022  7:45 AM [facility]URSE [facility]  02/10/2022  8:00 AM [facility] US1 [facility]    Reva Bores, MD

## 2021-12-23 LAB — RPR: RPR Ser Ql: NONREACTIVE

## 2021-12-23 LAB — CBC
Hematocrit: 32.8 % — ABNORMAL LOW (ref 34.0–46.6)
Hemoglobin: 10.8 g/dL — ABNORMAL LOW (ref 11.1–15.9)
MCH: 28 pg (ref 26.6–33.0)
MCHC: 32.9 g/dL (ref 31.5–35.7)
MCV: 85 fL (ref 79–97)
Platelets: 226 10*3/uL (ref 150–450)
RBC: 3.86 x10E6/uL (ref 3.77–5.28)
RDW: 12.4 % (ref 11.7–15.4)
WBC: 8.1 10*3/uL (ref 3.4–10.8)

## 2021-12-23 LAB — HIV ANTIBODY (ROUTINE TESTING W REFLEX): HIV Screen 4th Generation wRfx: NONREACTIVE

## 2021-12-23 LAB — GLUCOSE TOLERANCE, 2 HOURS W/ 1HR
Glucose, 1 hour: 170 mg/dL (ref 70–179)
Glucose, 2 hour: 99 mg/dL (ref 70–152)
Glucose, Fasting: 89 mg/dL (ref 70–91)

## 2021-12-27 NOTE — L&D Delivery Note (Addendum)
OB/GYN Faculty Practice Delivery Note ? ?Kim Mejia is a 21 y.o. G2P1001 s/p SVD at [redacted]w[redacted]d. She was admitted for IOL d/t oligohydramnios.  ? ?ROM: 8h 59m with clear, bloody fluid ?GBS Status: Negative ?Maximum Maternal Temperature: 98.7 ? ?Labor Progress: ?Cytotec x 1 and cooks balloon ?AROM performed and started on pitocin ?Epidural placed ?Dilated to complete ? ?Delivery Date/Time: 4656 ?Delivery: Called to room and patient was complete and pushing. Head delivered ROA. No nuchal cord present. Shoulder and body delivered in usual fashion. Infant with spontaneous cry, placed on mother's abdomen, dried and stimulated. Cord clamped x 2 after 1-minute delay, and cut by father of baby under my direct supervision. Cord blood drawn. Placenta delivered spontaneously with gentle cord traction. Fundus firm with massage and Pitocin. There was trickling noted and upon sweep of lower uterine segment a large blood clot was removed. The bleeding/trickling stopped after the sweep was done. Labia, perineum, vagina, and cervix were inspected, 1st degree perineal laceration present.  ? ?Placenta: complete, three vessel cord appreciated ?Complications: none ?Lacerations: 1st degree perineal ?EBL: 150 mL ?Analgesia: Epidural ? ? ?Infant: boy  APGARs 8,9  pending ? ?Levin Erp, MD ?[facility] PGY-1 ?Center for [employer], [facility]  ? ?GME ATTESTATION:  ?I saw and evaluated the patient. I agree with the findings and the plan of care as documented in the resident?s note and have made all necessary edits. I was gloved and present for entire delivery. ? ?Warner Mccreedy, MD, MPH ?OB Fellow, Faculty Practice ?[place], Center for [facility] ?02/26/2022 3:34 AM ? ? ? ?  ?

## 2022-01-06 ENCOUNTER — Other Ambulatory Visit: Payer: Self-pay

## 2022-01-06 ENCOUNTER — Ambulatory Visit (INDEPENDENT_AMBULATORY_CARE_PROVIDER_SITE_OTHER): Payer: Medicaid Other | Admitting: Nurse Practitioner

## 2022-01-06 ENCOUNTER — Encounter: Payer: Self-pay | Admitting: Nurse Practitioner

## 2022-01-06 VITALS — BP 109/70 | HR 80 | Wt 248.0 lb

## 2022-01-06 DIAGNOSIS — Z3480 Encounter for supervision of other normal pregnancy, unspecified trimester: Secondary | ICD-10-CM

## 2022-01-06 DIAGNOSIS — O09899 Supervision of other high risk pregnancies, unspecified trimester: Secondary | ICD-10-CM

## 2022-01-06 DIAGNOSIS — Z3A31 31 weeks gestation of pregnancy: Secondary | ICD-10-CM

## 2022-01-06 DIAGNOSIS — Z141 Cystic fibrosis carrier: Secondary | ICD-10-CM

## 2022-01-06 NOTE — Progress Notes (Addendum)
° ° °  Subjective:  Kim Mejia is a 21 y.o. G2P1001 at [redacted]w[redacted]d being seen today for ongoing prenatal care.  She is currently monitored for the following issues for this low-risk pregnancy and has BMI 40.0-44.9, adult (HCC); Supervision of other normal pregnancy, antepartum; Cystic fibrosis carrier, antepartum; Obesity in pregnancy; History of gestational hypertension; and History of GBS (group B streptococcus) UTI, currently pregnant on their problem list.  Patient reports  some pelvic pain when lying down .  Contractions: Not present. Vag. Bleeding: None.  Movement: Present. Denies leaking of fluid.   The following portions of the patient's history were reviewed and updated as appropriate: allergies, current medications, past family history, past medical history, past social history, past surgical history and problem list. Problem list updated.  Objective:   Vitals:   01/06/22 0833  BP: 109/70  Pulse: 80  Weight: 248 lb (112.5 kg)    Fetal Status: Fetal Heart Rate (bpm): 150 Fundal Height: 32 cm Movement: Present     General:  Alert, oriented and cooperative. Patient is in no acute distress.  Skin: Skin is warm and dry. No rash noted.   Cardiovascular: Normal heart rate noted  Respiratory: Normal respiratory effort, no problems with respiration noted  Abdomen: Soft, gravid, appropriate for gestational age. Pain/Pressure: Present     Pelvic:  Cervical exam deferred        Extremities: Normal range of motion.  Edema: None  Mental Status: Normal mood and affect. Normal behavior. Normal judgment and thought content.   Urinalysis:      Assessment and Plan:  Pregnancy: G2P1001 at [redacted]w[redacted]d  1. Supervision of other normal pregnancy, antepartum History of gestational hypertension - normal BP today - taking low dose aspirin Pubic symphysis tenderness - mild.  Reviewed stretching exercises for the lower back.  Reviewed changes in pregnancy - pain that she is having at this time is normal  2.  Cystic fibrosis carrier, antepartum   3. [redacted] weeks gestation of pregnancy   Preterm labor symptoms and general obstetric precautions including but not limited to vaginal bleeding, contractions, leaking of fluid and fetal movement were reviewed in detail with the patient. Please refer to After Visit Summary for other counseling recommendations.  Return in about 2 weeks (around 01/20/2022) for in person ROB.  Nolene Bernheim, RN, MSN, NP-BC Nurse Practitioner, [facility] for [employer], [facility] 01/06/2022 8:47 AM

## 2022-01-06 NOTE — Patient Instructions (Signed)
BRAINSTORMING  Develop a Plan Goals: Provide a way to start conversation about your new life with a baby Assist parents in recognizing and using resources within their reach Help pave the way before birth for an easier period of transition afterwards.  Make a list of the following information to keep in a central location: Full name of Mom and Partner: _____________________________________________ 39 full name and Date of Birth: ___________________________________________ Home Address: ___________________________________________________________ ________________________________________________________________________ Home Phone: ____________________________________________________________ Parents' cell numbers: _____________________________________________________ ________________________________________________________________________ Name and contact info for OB: ______________________________________________ Name and contact info for Pediatrician:________________________________________ Contact info for Lactation Consultants: ________________________________________  REST and SLEEP *You each need at least 4-5 hours of uninterrupted sleep every day. Write specific names and contact information.* How are you going to rest in the postpartum period? While partner's home? When partner returns to work? When you both return to work? Where will your baby sleep? Who is available to help during the day? Evening? Night? Who could move in for a period to help support you? What are some ideas to help you get enough sleep? __________________________________________________________________________________________________________________________________________________________________________________________________________________________________________ NUTRITIOUS FOOD AND DRINK *Plan for meals before your baby is born so you can have healthy food to eat during the immediate postpartum  period.* Who will look after breakfast? Lunch? Dinner? List names and contact information. Brainstorm quick, healthy ideas for each meal. What can you do before baby is born to prepare meals for the postpartum period? How can others help you with meals? Which grocery stores provide online shopping and delivery? Which restaurants offer take-out or delivery options? ______________________________________________________________________________________________________________________________________________________________________________________________________________________________________________________________________________________________________________________________________________________________________________________________________  CARE FOR MOM *It's important that mom is cared for and pampered in the postpartum period. Remember, the most important ways new mothers need care are: sleep, nutrition, gentle exercise, and time off.* Who can come take care of mom during this period? Make a list of people with their contact information. List some activities that make you feel cared for, rested, and energized? Who can make sure you have opportunities to do these things? Does mom have a space of her very own within your home that's just for her? Make a [facility] where she can be comfortable, rest, and renew herself daily. ______________________________________________________________________________________________________________________________________________________________________________________________________________________________________________________________________________________________________________________________________________________________________________________________________    CARE FOR AND FEEDING BABY *Knowledgeable and encouraging people will offer the best support with regard to feeding your baby.* Educate yourself and choose the best feeding option  for your baby. Make a list of people who will guide, support, and be a resource for you as your care for and feed your baby. (Friends that have breastfed or are currently breastfeeding, lactation consultants, breastfeeding support groups, etc.) Consider a postpartum doula. (These websites can give you information: dona.org & BuyingShow.es) Seek out local breastfeeding resources like the breastfeeding support group at [employer] or [employer]. ______________________________________________________________________________________________________________________________________________________________________________________________________________________________________________________________________________________________________________________________________________________________________________________________________  Verner Chol AND ERRANDS Who can help with a thorough cleaning before baby is born? Make a list of people who will help with housekeeping and chores, like laundry, light cleaning, dishes, bathrooms, etc. Who can run some errands for you? What can you do to make sure you are stocked with basic supplies before baby is born? Who is going to do the shopping? ______________________________________________________________________________________________________________________________________________________________________________________________________________________________________________________________________________________________________________________________________________________________________________________________________     Family Adjustment *Nurture yourselvesit helps parents be more loving and allows for better bonding with their child.* What sorts of things do you and partner enjoy doing together? Which activities help you to connect and strengthen your relationship? Make a list of those things. Make a list of people whom  trust to care for your baby so you  can have some time together as a couple. °What types of things help partner feel connected to Mom? Make a list. °What needs will partner have in order to bond with baby? °Other children? Who will care for them when you go into labor and while you are in the hospital? °Think about what the needs of your older children might be. Who can help you meet those needs? In what ways are you helping them prepare for bringing baby home? List some specific strategies you have for family adjustment. °_______________________________________________________________________________________________________________________________________________________________________________________________________________________________________________________________________________________________________________________________________________ ° °SUPPORT °*Someone who can empathize with experiences normalizes your problems and makes them more bearable.* °Make a list of other friends, neighbors, and/or co-workers you know with infants (and small children, if applicable) with whom you can connect. °Make a list of local or online support groups, mom groups, etc. in which you can be involved. °______________________________________________________________________________________________________________________________________________________________________________________________________________________________________________________________________________________________________________________________________________________________________________________________________ ° °Childcare Plans °Investigate and plan for childcare if mom is returning to work. °Talk about mom's concerns about her transition back to work. °Talk about partner's concerns regarding this transition. ° °Mental Health °*Your mental health is one of the highest priorities for a pregnant or postpartum mom.* °1 in 5 women experience anxiety and/or depression from the time  of conception through the first year after birth. °Postpartum Mood Disorders are the #1 complication of pregnancy and childbirth and the suffering experienced by these mothers is not necessary! These illnesses are temporary and respond well to treatment, which often includes self-care, social support, talk therapy, and medication when needed. °Women experiencing anxiety and depression often say things like: “I'm supposed to be happy…why do I feel so sad?”, “Why can't I snap out of it?”, “I'm having thoughts that scare me.” °There is no need to be embarrassed if you are feeling these symptoms: °Overwhelmed, anxious, angry, sad, guilty, irritable, hopeless, exhausted but can't sleep °You are NOT alone. You are NOT to blame. With help, you WILL be well. °Where can I find help? Medical professionals such as your OB, midwife, gynecologist, family practitioner, primary care provider, pediatrician, or mental health providers; [facility] support groups: Feelings After Birth, Breastfeeding Support Group, Baby and Me Group, and Fit 4 Two exercise classes. °You have permission to ask for help. It will confirm your feelings, validate your experiences, share/learn coping strategies, and gain support and encouragement as you heal. You are important! °BRAINSTORM °Make a list of local resources, including resources for mom and for partner. °Identify support groups. °Identify people to call late at night - include names and contact info. °Talk with partner about perinatal mood and anxiety disorders. °Talk with your OB, midwife, and doula about baby blues and about perinatal mood and anxiety disorders. °Talk with your pediatrician about perinatal mood and anxiety disorders. ° ° °Support & Sanity Savers   °What do you really need? ° °Basics °In preparing for a new baby, many expectant parents spend hours shopping for baby clothes, decorating the nursery, and deciding which car seat to buy. Yet most don't think much about what  the reality of parenting a newborn will be like, and what they need to make it through that. So, here is the advice of experienced parents. We know you'll read this, and think “they're exaggerating, I don't really need that.” Just trust us on these, OK? Plan for all of this, and if it turns out you don't need it, come back and teach us how you did it! ° °Must-Haves (Once baby's survival   needs are met, make sure you attend to your own survival needs!) °Sleep °An average newborn sleeps 16-18 hours per day, over 6-7 sleep periods, rarely more than three hours at a time. It is normal and healthy for a newborn to wake throughout the night... but really hard on parents!! °Naps. Prioritize sleep above any responsibilities like: cleaning house, visiting friends, running errands, etc.  Sleep whenever baby sleeps. If you can't nap, at least have restful times when baby eats. The more rest you get, the more patient you will be, the more emotionally stable, and better at solving problems. ° °Food °You may not have realized it would be difficult to eat when you have a newborn. Yet, when we talk to °countless new parents, they say things like “it may be 2:00 pm when I realize I haven't had breakfast yet.” Or “every time we sit down to dinner, baby needs to eat, and my food gets cold, so I don't bother to eat it.” °Finger food. Before your baby is born, stock up with one months' worth of food that: 1) you can eat with one hand while holding a baby, 2) doesn't need to be prepped, 3) is good hot or cold, 4) doesn't spoil when left out for a few hours, and 5) you like to eat. Think about: nuts, dried fruit, Clif bars, pretzels, jerky, gogurt, baby carrots, apples, bananas, crackers, cheez-n-crackers, string cheese, hot pockets or frozen burritos to microwave, garden burgers and breakfast pastries to put in the toaster, yogurt drinks, etc. °Restaurant Menus. Make lists of your favorite restaurants & menu items. When family/friends  want to help, you can give specific information without much thought. They can either bring you the food or send gift cards for just the right meals. °Freezer Meals.  Take some time to make a few meals to put in the freezer ahead of time.  Easy to freeze meals can be anything such as soup, lasagna, chicken pie, or spaghetti sauce. °Set up a Meal Schedule.  Ask friends and family to sign up to bring you meals during the first few weeks of being home. (It can be passed around at baby showers!) You have no idea how helpful this will be until you are in the throes of parenting.  www.takethemameal.com is a great website to check out. °Emotional Support °Know who to call when you're stressed out. Parenting a newborn is very challenging work. There are times when it totally overwhelms your normal coping abilities. EVERY NEW PARENT NEEDS TO HAVE A PLAN FOR WHO TO CALL WHEN THEY JUST CAN'T COPE ANY MORE. (And it has to be someone other than the baby's other parent!) Before your baby is born, come up with at least one person you can call for support - write their phone number down and post it on the refrigerator. °Anxiety & Sadness. Baby blues are normal after pregnancy; however, there are more severe types of anxiety & sadness which can occur and should not be ignored.  They are always treatable, but you have to take the first step by reaching out for help. [facility] offers a “Mom Talk” group which meets every Tuesday from 10 am - 11 am.  This group is for new moms who need support and connection after their babies are born.  Call [phone].  °Really, Really Helpful (Plan for them! Make sure these happen often!!) °Physical Support with Taking Care of Yourselves °Asking friends and family. Before your baby is born, set up a schedule   schedule of people who can come and visit and help out (or ask a friend to schedule for you). Any time someone says let me know what I can do to help, sign them up for a day. When they get  there, their job is not to take care of the baby (that's your job and your joy). Their job is to take care of you!  Postpartum doulas. If you don't have anyone you can call on for support, look into postpartum doulas:  professionals at helping parents with caring for baby, caring for themselves, getting breastfeeding started, and helping with household tasks. www.padanc.org is a helpful website for learning about doulas in our area. Peer Support / Parent Groups Why: One of the greatest ideas for new parents is to be around other new parents. Parent groups give you a chance to share and listen to others who are going through the same season of life, get a sense of what is normal infant development by watching several babies learn and grow, share your stories of triumph and struggles with empathetic ears, and forgive your own mistakes when you realize all parents are learning by trial and error. Where to find: There are many places you can meet other new parents throughout our community.  [facility] offers the following classes for new moms and their little ones:  Baby and Me (Birth to [place]) and Breastfeeding Support Group. Go to www.conehealthybaby.com or call [phone] for more information. Time for your Relationship It's easy to get so caught up in meeting baby's immediate needs that it's hard to find time to connect with your partner, and meet the needs of your relationship. It's also easy to forget what quality time with your partner actually looks like. If you take your baby on a date, you'd be amazed how much of your couple time is spent feeding the baby, diapering the baby, admiring the baby, and talking about the baby. Dating: Try to take time for just the two of you. Babysitter tip: Sometimes when moms are breastfeeding a newborn, they find it hard to figure out how to schedule outings around baby's unpredictable feeding schedules. Have the babysitter come for a three hour period. When  she comes over, if baby has just eaten, you can leave right away, and come back in two hours. If baby hasn't fed recently, you start the date at home. Once baby gets hungry and gets a good feeding in, you can head out for the rest of your date time. Date Nights at Home: If you can't get out, at least set aside one evening a week to prioritize your relationship: whenever baby dozes off or doesn't have any immediate needs, spend a little time focusing on each other. Potential conflicts: The main relationship conflicts that come up for new parents are: issues related to sexuality, financial stresses, a feeling of an unfair division of household tasks, and conflicts in parenting styles. The more you can work on these issues before baby arrives, the better!  Fun and Frills (Don't forget these and don't feel guilty for indulging in them!) Everyone has something in life that is a fun little treat that they do just for themselves. It may be: reading the morning paper, or going for a daily jog, or having coffee with a friend once a week, or going to a movie on Friday nights, or fine chocolates, or bubble baths, or curling up with a good book. Unless you do fun things for yourself every now and  it's hard to have the energy for fun with your baby. Whatever your “special” treats are, make sure you find a way to continue to indulge in them after your baby is born. These special moments can recharge you, and allow you to return to baby with a new joy ° ° °PERINATAL MOOD DISORDERS: MATERNAL MENTAL HEALTH FROM CONCEPTION THROUGH THE POSTPARTUM PERIOD ° ° °_________________________________________Emergency and Crisis Resources °If you are an imminent risk to self or others, are experiencing intense personal distress, and/or have noticed significant changes in activities of daily living, call:  °911 °[facility]: 336-890-2700 ° [address], [place], [place], [address] °Mobile Crisis:  877-626-1772 °National Suicide Hotline: 988 °Or visit the following crisis centers: °Local Emergency Departments °[facility]: [address], [place]  °336-676-6840. Hours: 8:30AM-5PM. Insurance Accepted: Medicaid, Medicare, and Uninsured.  °[facility]:  [address], [place]  °Mon-Friday 8am-3pm, [phone]   ° °                                                                              ___________ Non-Crisis Resources °To identify specific providers that are covered by your insurance, contact your insurance company or local agencies:  °[facility]: 1-800-256-2452 °[facility]: 888-581-9988 °[employer]: 1-800-939-5911 °Postpartum Support International- Warm-line: 1-800-944-4773  ° °                                                   __Outpatient Therapy and Medication Management  ° °Providers:  °[facility]: [phone] °Hours: 9AM-5PM  Insurance Accepted: AARP, Aetna, BCBS, Cigna, [employer], Humana, Medicare  °[facility] [phone] °Hours: 8AM-5:30PM  nsurance Accepted: All insurances EXCEPT AARP, Aetna, [employer], and Humana °[facility]: [phone] °Hours: 8AM-8PM Insurance Accepted: Aetna, BCBS, Cigna, [employer], Medicaid, Medicare, Uninsured °[facility]: [phone]- [phone] °[facility]: [phone] °Hours: 8:30AM-7PM Insurance Accepted: Aetna, BCBS, Medicaid, Medicare, Tricare, United Healthcare °[facility]:  [phone]  ° Hours:9AM-5PM Insurance Accepted:  Aetna, BCBS, [facility], Medicaid, United Health Care  °[facility]: [phone] °Hours: 9AM-5:30PM Insurance Accepted: AARP, Aetna, BCBS, Cigna, and Medicaid, Medicare, United Health Care °[facility]:  [phone] °Hours: 9am-5pm Insurance Accepted: BCBS; they do not accept Medicaid/Medicare °[facility]: [phone] °Hours: 9am-9pm Insurance Accepted: All major insurance including Medicaid and Medicare °[facility]: [phone] °Hours: 9AM- 5PM Insurance Accepted: All insurances EXCEPT Medicaid and Medicare. °[facility]: [phone] ° ° °____________                                                                     Parenting Support Groups °[facility]: 336-832-6682 °[facility]:  [phone]- [phone] °Family Support Network: (support for children in the NICU   NICU and/or with special needs), 352-781-9267   ___________                                                                 Mental Health Support Groups Mental Health Association: 401-396-2580    _____________                                                                                  Online Resources Postpartum Support International: http://jones-berg.com/  202-542-7CWC 2Moms Supporting Moms:  www.momssupportingmoms.net

## 2022-01-12 ENCOUNTER — Ambulatory Visit: Payer: Medicaid Other | Attending: Obstetrics and Gynecology

## 2022-01-12 ENCOUNTER — Other Ambulatory Visit: Payer: Self-pay

## 2022-01-12 ENCOUNTER — Ambulatory Visit: Payer: Medicaid Other | Admitting: *Deleted

## 2022-01-12 VITALS — BP 107/65 | HR 91

## 2022-01-12 DIAGNOSIS — Z8759 Personal history of other complications of pregnancy, childbirth and the puerperium: Secondary | ICD-10-CM | POA: Diagnosis not present

## 2022-01-12 DIAGNOSIS — O99213 Obesity complicating pregnancy, third trimester: Secondary | ICD-10-CM

## 2022-01-12 DIAGNOSIS — E669 Obesity, unspecified: Secondary | ICD-10-CM | POA: Diagnosis not present

## 2022-01-12 DIAGNOSIS — Z3A31 31 weeks gestation of pregnancy: Secondary | ICD-10-CM

## 2022-01-21 ENCOUNTER — Other Ambulatory Visit: Payer: Self-pay

## 2022-01-21 ENCOUNTER — Encounter: Payer: Medicaid Other | Admitting: Medical

## 2022-01-21 ENCOUNTER — Ambulatory Visit (INDEPENDENT_AMBULATORY_CARE_PROVIDER_SITE_OTHER): Payer: Medicaid Other | Admitting: Medical

## 2022-01-21 ENCOUNTER — Encounter: Payer: Self-pay | Admitting: Medical

## 2022-01-21 VITALS — BP 115/76 | HR 76 | Wt 249.2 lb

## 2022-01-21 DIAGNOSIS — Z3480 Encounter for supervision of other normal pregnancy, unspecified trimester: Secondary | ICD-10-CM

## 2022-01-21 DIAGNOSIS — Z8759 Personal history of other complications of pregnancy, childbirth and the puerperium: Secondary | ICD-10-CM

## 2022-01-21 DIAGNOSIS — Z3A33 33 weeks gestation of pregnancy: Secondary | ICD-10-CM

## 2022-01-21 DIAGNOSIS — Z141 Cystic fibrosis carrier: Secondary | ICD-10-CM

## 2022-01-21 DIAGNOSIS — O99210 Obesity complicating pregnancy, unspecified trimester: Secondary | ICD-10-CM

## 2022-01-21 DIAGNOSIS — Z87440 Personal history of urinary (tract) infections: Secondary | ICD-10-CM

## 2022-01-21 DIAGNOSIS — O09899 Supervision of other high risk pregnancies, unspecified trimester: Secondary | ICD-10-CM

## 2022-01-21 NOTE — Progress Notes (Signed)
Pt reports fetal movement with some pelvic pressure. 

## 2022-01-21 NOTE — Progress Notes (Signed)
° °  PRENATAL VISIT NOTE  Subjective:  Kim Mejia is a 21 y.o. G2P1001 at [redacted]w[redacted]d being seen today for ongoing prenatal care.  She is currently monitored for the following issues for this high-risk pregnancy and has BMI 40.0-44.9, adult ([place]); Supervision of other normal pregnancy, antepartum; Cystic fibrosis carrier, antepartum; Obesity in pregnancy; History of gestational hypertension; and History of GBS (group B streptococcus) UTI, currently pregnant on their problem list.  Patient reports pelvic pressure at night.  Contractions: Not present. Vag. Bleeding: None.  Movement: Present. Denies leaking of fluid.   The following portions of the patient's history were reviewed and updated as appropriate: allergies, current medications, past family history, past medical history, past social history, past surgical history and problem list.   Objective:   Vitals:   01/21/22 1015  BP: 115/76  Pulse: 76  Weight: 249 lb 3.2 oz (113 kg)    Fetal Status: Fetal Heart Rate (bpm): 145   Movement: Present     General:  Alert, oriented and cooperative. Patient is in no acute distress.  Skin: Skin is warm and dry. No rash noted.   Cardiovascular: Normal heart rate noted  Respiratory: Normal respiratory effort, no problems with respiration noted  Abdomen: Soft, gravid, appropriate for gestational age.  Pain/Pressure: Present     Pelvic: Cervical exam deferred        Extremities: Normal range of motion.  Edema: None  Mental Status: Normal mood and affect. Normal behavior. Normal judgment and thought content.   Assessment and Plan:  Pregnancy: G2P1001 at [redacted]w[redacted]d 1. Supervision of other normal pregnancy, antepartum - Doing well - Normal third trimester labs reviewed - Advised pregnancy belt during the day, warm bath at night and Tylenol PRN for pelvic pressure  - Patient is travelling to [place] next week, advised to be sure to get up and walk around every few hours to avoid clots  2. Obesity in  pregnancy - Follow-up [place] scheduled 2/15  3. History of GBS (group B streptococcus) UTI, currently pregnant  4. History of gestational hypertension - BPP scheduled 2/15 - Normotensive today   5. Cystic fibrosis carrier, antepartum  6. [redacted] weeks gestation of pregnancy  Preterm labor symptoms and general obstetric precautions including but not limited to vaginal bleeding, contractions, leaking of fluid and fetal movement were reviewed in detail with the patient. Please refer to After Visit Summary for other counseling recommendations.   Return in about 2 weeks (around 02/04/2022) for LOB, In-Person.  Future Appointments  Date Time Provider [place]  01/27/2022  9:15 AM [facility] NST [facility]  02/03/2022 10:15 AM [facility] NST [facility]  02/10/2022  7:45 AM [facility] NURSE [facility]  02/10/2022  8:00 AM [facility] US1 [facility]    Kerry Hough, PA-C

## 2022-01-27 ENCOUNTER — Other Ambulatory Visit: Payer: Self-pay

## 2022-01-27 ENCOUNTER — Ambulatory Visit (INDEPENDENT_AMBULATORY_CARE_PROVIDER_SITE_OTHER): Payer: Medicaid Other | Admitting: General Practice

## 2022-01-27 ENCOUNTER — Ambulatory Visit (INDEPENDENT_AMBULATORY_CARE_PROVIDER_SITE_OTHER): Payer: Medicaid Other

## 2022-01-27 VITALS — BP 95/60 | HR 78

## 2022-01-27 DIAGNOSIS — O99210 Obesity complicating pregnancy, unspecified trimester: Secondary | ICD-10-CM

## 2022-01-27 NOTE — Progress Notes (Signed)
Pt informed that the ultrasound is considered a limited OB ultrasound and is not intended to be a complete ultrasound exam.  Patient also informed that the ultrasound is not being completed with the intent of assessing for fetal or placental anomalies or any pelvic abnormalities.  Explained that the purpose of today’s ultrasound is to assess for  BPP, presentation, and AFI.  Patient acknowledges the purpose of the exam and the limitations of the study.    ° °Ramses Klecka H RN BSN °01/27/22 ° °

## 2022-02-03 ENCOUNTER — Ambulatory Visit (INDEPENDENT_AMBULATORY_CARE_PROVIDER_SITE_OTHER): Payer: Medicaid Other | Admitting: General Practice

## 2022-02-03 ENCOUNTER — Other Ambulatory Visit: Payer: Self-pay

## 2022-02-03 ENCOUNTER — Ambulatory Visit (INDEPENDENT_AMBULATORY_CARE_PROVIDER_SITE_OTHER): Payer: Medicaid Other

## 2022-02-03 VITALS — BP 106/62 | HR 65

## 2022-02-03 DIAGNOSIS — O99210 Obesity complicating pregnancy, unspecified trimester: Secondary | ICD-10-CM | POA: Diagnosis not present

## 2022-02-03 NOTE — Progress Notes (Signed)
Pt informed that the ultrasound is considered a limited OB ultrasound and is not intended to be a complete ultrasound exam.  Patient also informed that the ultrasound is not being completed with the intent of assessing for fetal or placental anomalies or any pelvic abnormalities.  Explained that the purpose of today’s ultrasound is to assess for  BPP, presentation, and AFI.  Patient acknowledges the purpose of the exam and the limitations of the study.    ° °Tisha Cline H RN BSN °02/03/22 ° °

## 2022-02-04 ENCOUNTER — Ambulatory Visit (INDEPENDENT_AMBULATORY_CARE_PROVIDER_SITE_OTHER): Payer: Medicaid Other

## 2022-02-04 VITALS — BP 116/71 | HR 79 | Wt 257.0 lb

## 2022-02-04 DIAGNOSIS — Z3480 Encounter for supervision of other normal pregnancy, unspecified trimester: Secondary | ICD-10-CM

## 2022-02-04 DIAGNOSIS — Z3A35 35 weeks gestation of pregnancy: Secondary | ICD-10-CM

## 2022-02-04 DIAGNOSIS — Z8759 Personal history of other complications of pregnancy, childbirth and the puerperium: Secondary | ICD-10-CM

## 2022-02-04 DIAGNOSIS — O99210 Obesity complicating pregnancy, unspecified trimester: Secondary | ICD-10-CM

## 2022-02-04 NOTE — Progress Notes (Signed)
° °  HIGH-RISK PREGNANCY OFFICE VISIT  Patient name: Kim Mejia MRN [id number]  Date of birth: [birth year] Chief Complaint:   Routine Prenatal Visit  Subjective:   Kim Mejia is a 21 y.o. G71P1001 female at [redacted]w[redacted]d with an Estimated Date of Delivery: 03/10/22 being seen today for ongoing management of a high-risk pregnancy aeb has BMI 40.0-44.9, adult ([place]); Supervision of other normal pregnancy, antepartum; Cystic fibrosis carrier, antepartum; Obesity in pregnancy; History of gestational hypertension; and History of GBS (group B streptococcus) UTI, currently pregnant on their problem list.  Patient presents today with  intermittent cramping .  She states if feels like RLP, but wants to make sure.  She denies issues with urination, constipation, or diarrhea.  Patient endorses fetal movement.   Patient denies vaginal concerns including abnormal discharge, leaking of fluid, and bleeding.  Contractions: Not present. Vag. Bleeding: None.  Movement: Present.  Reviewed past medical,surgical, social, obstetrical and family history as well as problem list, medications and allergies.  Objective   Vitals:   02/04/22 1133  BP: 116/71  Pulse: 79  Weight: 257 lb (116.6 kg)  Body mass index is 44.11 kg/m.  Total Weight Gain:17 lb (7.711 kg)         Physical Examination:   General appearance: Well appearing, and in no distress  Mental status: Alert, oriented to person, place, and time  Skin: Warm & dry  Cardiovascular: Normal heart rate noted  Respiratory: Normal respiratory effort, no distress  Abdomen: Soft, gravid, nontender, AGA with Fundal Height: 37 cm  Pelvic: Cervical exam deferred           Extremities: Edema: Trace  Fetal Status: Fetal Heart Rate (bpm): 154  Movement: Present   No results found for this or any previous visit (from the past 24 hour(s)).  Assessment & Plan:  High-risk pregnancy of a 21 y.o., G2P1001 at [redacted]w[redacted]d with an Estimated Date of Delivery: 03/10/22   1.  Supervision of other normal pregnancy, antepartum -Anticipatory guidance for upcoming appts. -Patient to schedule next appt in 1 weeks for an in-person visit. -Educated on GBS bacteria including what it is, why we test, and how and when we treat if needed. -Reassured that some pregnancies GBS detected and others negative.  2. History of gestational hypertension -BP normotensive.  -Taking bASA  3. Obesity in pregnancy -TWG 17 lbs -Taking bASA  4. [redacted] weeks gestation of pregnancy -Doing well -Reviewed RLP, when it occurs, and resolution. -Encouraged to monitor c/o cramping and report with any worsening of condition. -Patient verbalizes understanding.    Meds: No orders of the defined types were placed in this encounter.  Labs/procedures today:  Lab Orders  No laboratory test(s) ordered today     Reviewed: Preterm labor symptoms and general obstetric precautions including but not limited to vaginal bleeding, contractions, leaking of fluid and fetal movement were reviewed in detail with the patient.  All questions were answered.  Follow-up: Return in about 1 week (around 02/11/2022) for LROB with GBS.  No orders of the defined types were placed in this encounter.  Maryann Conners MSN, CNM 02/04/2022

## 2022-02-10 ENCOUNTER — Other Ambulatory Visit: Payer: Self-pay

## 2022-02-10 ENCOUNTER — Ambulatory Visit: Payer: Medicaid Other | Admitting: *Deleted

## 2022-02-10 ENCOUNTER — Encounter: Payer: Self-pay | Admitting: *Deleted

## 2022-02-10 ENCOUNTER — Ambulatory Visit: Payer: Medicaid Other | Attending: Obstetrics and Gynecology

## 2022-02-10 VITALS — BP 116/69 | HR 67

## 2022-02-10 DIAGNOSIS — E668 Other obesity: Secondary | ICD-10-CM | POA: Diagnosis not present

## 2022-02-10 DIAGNOSIS — O99213 Obesity complicating pregnancy, third trimester: Secondary | ICD-10-CM | POA: Diagnosis not present

## 2022-02-10 DIAGNOSIS — Z3A36 36 weeks gestation of pregnancy: Secondary | ICD-10-CM | POA: Diagnosis not present

## 2022-02-10 DIAGNOSIS — Z6841 Body Mass Index (BMI) 40.0 and over, adult: Secondary | ICD-10-CM | POA: Insufficient documentation

## 2022-02-10 DIAGNOSIS — Z8759 Personal history of other complications of pregnancy, childbirth and the puerperium: Secondary | ICD-10-CM | POA: Diagnosis not present

## 2022-02-10 DIAGNOSIS — O09293 Supervision of pregnancy with other poor reproductive or obstetric history, third trimester: Secondary | ICD-10-CM | POA: Diagnosis not present

## 2022-02-11 ENCOUNTER — Ambulatory Visit (INDEPENDENT_AMBULATORY_CARE_PROVIDER_SITE_OTHER): Payer: Medicaid Other | Admitting: Nurse Practitioner

## 2022-02-11 ENCOUNTER — Other Ambulatory Visit (HOSPITAL_COMMUNITY)
Admission: RE | Admit: 2022-02-11 | Discharge: 2022-02-11 | Disposition: A | Discharge status: Home / Self Care | Payer: Medicaid Other | Source: Ambulatory Visit | Attending: Nurse Practitioner | Admitting: Nurse Practitioner

## 2022-02-11 ENCOUNTER — Encounter: Payer: Self-pay | Admitting: Nurse Practitioner

## 2022-02-11 VITALS — BP 114/68 | HR 76 | Wt 256.0 lb

## 2022-02-11 DIAGNOSIS — Z3A36 36 weeks gestation of pregnancy: Secondary | ICD-10-CM

## 2022-02-11 DIAGNOSIS — Z3480 Encounter for supervision of other normal pregnancy, unspecified trimester: Secondary | ICD-10-CM

## 2022-02-11 DIAGNOSIS — Z6841 Body Mass Index (BMI) 40.0 and over, adult: Secondary | ICD-10-CM

## 2022-02-11 LAB — OB RESULTS CONSOLE GC/CHLAMYDIA: Gonorrhea: NEGATIVE

## 2022-02-11 NOTE — Progress Notes (Signed)
Pt in office for routine prenatal visit. She has no questions or concerns today.  PHQ9= 1 GAD7=1

## 2022-02-11 NOTE — Progress Notes (Signed)
° ° °  Subjective:  Kim Mejia is a 21 y.o. G2P1001 at [redacted]w[redacted]d being seen today for ongoing prenatal care.  She is currently monitored for the following issues for this low-risk pregnancy and has BMI 40.0-44.9, adult ([place]); Supervision of other normal pregnancy, antepartum; Cystic fibrosis carrier, antepartum; Obesity in pregnancy; History of gestational hypertension; and History of GBS (group B streptococcus) UTI, currently pregnant on their problem list.  Patient reports no complaints.  Contractions: Not present. Vag. Bleeding: None.  Movement: Present. Denies leaking of fluid.   The following portions of the patient's history were reviewed and updated as appropriate: allergies, current medications, past family history, past medical history, past social history, past surgical history and problem list. Problem list updated.  Objective:   Vitals:   02/11/22 1031  BP: 114/68  Pulse: 76  Weight: 256 lb (116.1 kg)    Fetal Status: Fetal Heart Rate (bpm): 148 Fundal Height: 37 cm Movement: Present     General:  Alert, oriented and cooperative. Patient is in no acute distress.  Skin: Skin is warm and dry. No rash noted.   Cardiovascular: Normal heart rate noted  Respiratory: Normal respiratory effort, no problems with respiration noted  Abdomen: Soft, gravid, appropriate for gestational age. Pain/Pressure: Present     Pelvic:  Cervical exam deferred        Extremities: Normal range of motion.  Edema: Trace  Mental Status: Normal mood and affect. Normal behavior. Normal judgment and thought content.   Urinalysis:      Assessment and Plan:  Pregnancy: G2P1001 at [redacted]w[redacted]d  1. Supervision of other normal pregnancy, antepartum Doing well. No edema.  Baby moving well. Vaginal swabs done.  - Cervicovaginal ancillary only( [place]) - Culture, beta strep (group b only)  2. BMI 40.0-44.9, adult ([facility])   3. [redacted] weeks gestation of pregnancy   Preterm labor symptoms and general obstetric  precautions including but not limited to vaginal bleeding, contractions, leaking of fluid and fetal movement were reviewed in detail with the patient. Please refer to After Visit Summary for other counseling recommendations.  Return in about 1 week (around 02/18/2022) for will need weekly appointments x 4 for ROB.  Earlie Server, RN, MSN, NP-BC Nurse Practitioner, [facility] for [employer] 02/11/2022 11:09 AM

## 2022-02-12 LAB — CERVICOVAGINAL ANCILLARY ONLY
Chlamydia: NEGATIVE
Comment: NEGATIVE
Comment: NORMAL
Neisseria Gonorrhea: NEGATIVE

## 2022-02-15 LAB — CULTURE, BETA STREP (GROUP B ONLY): Strep Gp B Culture: NEGATIVE

## 2022-02-17 ENCOUNTER — Other Ambulatory Visit: Payer: Self-pay

## 2022-02-17 ENCOUNTER — Ambulatory Visit (INDEPENDENT_AMBULATORY_CARE_PROVIDER_SITE_OTHER): Payer: Medicaid Other

## 2022-02-17 ENCOUNTER — Ambulatory Visit (INDEPENDENT_AMBULATORY_CARE_PROVIDER_SITE_OTHER): Payer: Medicaid Other | Admitting: General Practice

## 2022-02-17 VITALS — BP 99/60 | HR 74

## 2022-02-17 DIAGNOSIS — O99210 Obesity complicating pregnancy, unspecified trimester: Secondary | ICD-10-CM

## 2022-02-17 NOTE — Progress Notes (Signed)
Pt informed that the ultrasound is considered a limited OB ultrasound and is not intended to be a complete ultrasound exam.  Patient also informed that the ultrasound is not being completed with the intent of assessing for fetal or placental anomalies or any pelvic abnormalities.  Explained that the purpose of today’s ultrasound is to assess for  BPP, presentation, and AFI.  Patient acknowledges the purpose of the exam and the limitations of the study.    ° °Jaivyn Gulla H RN BSN °02/17/22 ° °

## 2022-02-22 ENCOUNTER — Other Ambulatory Visit: Payer: Self-pay

## 2022-02-22 ENCOUNTER — Encounter: Payer: Self-pay | Admitting: Obstetrics and Gynecology

## 2022-02-22 ENCOUNTER — Ambulatory Visit (INDEPENDENT_AMBULATORY_CARE_PROVIDER_SITE_OTHER): Payer: Medicaid Other | Admitting: Obstetrics and Gynecology

## 2022-02-22 VITALS — BP 110/71 | HR 79 | Wt 260.0 lb

## 2022-02-22 DIAGNOSIS — O99210 Obesity complicating pregnancy, unspecified trimester: Secondary | ICD-10-CM

## 2022-02-22 DIAGNOSIS — Z8759 Personal history of other complications of pregnancy, childbirth and the puerperium: Secondary | ICD-10-CM

## 2022-02-22 DIAGNOSIS — Z3480 Encounter for supervision of other normal pregnancy, unspecified trimester: Secondary | ICD-10-CM

## 2022-02-22 NOTE — Progress Notes (Signed)
° °  PRENATAL VISIT NOTE  Subjective:  Kim Mejia is a 21 y.o. G2P1001 at [redacted]w[redacted]d being seen today for ongoing prenatal care.  She is currently monitored for the following issues for this high-risk pregnancy and has BMI 40.0-44.9, adult ([facility]); Supervision of other normal pregnancy, antepartum; Cystic fibrosis carrier, antepartum; Obesity in pregnancy; History of gestational hypertension; and History of GBS (group B streptococcus) UTI, currently pregnant on their problem list.  Patient reports no complaints.  Contractions: Not present. Vag. Bleeding: None.  Movement: Present. Denies leaking of fluid.   The following portions of the patient's history were reviewed and updated as appropriate: allergies, current medications, past family history, past medical history, past social history, past surgical history and problem list.   Objective:   Vitals:   02/22/22 1353  BP: 110/71  Pulse: 79  Weight: 260 lb (117.9 kg)    Fetal Status: Fetal Heart Rate (bpm): 135 Fundal Height: 38 cm Movement: Present     General:  Alert, oriented and cooperative. Patient is in no acute distress.  Skin: Skin is warm and dry. No rash noted.   Cardiovascular: Normal heart rate noted  Respiratory: Normal respiratory effort, no problems with respiration noted  Abdomen: Soft, gravid, appropriate for gestational age.  Pain/Pressure: Present     Pelvic: Cervical exam deferred        Extremities: Normal range of motion.     Mental Status: Normal mood and affect. Normal behavior. Normal judgment and thought content.   Assessment and Plan:  Pregnancy: G2P1001 at [redacted]w[redacted]d 1. Supervision of other normal pregnancy, antepartum Patient is doing well without complaints  2. Obesity in pregnancy NST on 3/1 Continue ASA  3. History of gestational hypertension Normotensive and asymptomatic  Term labor symptoms and general obstetric precautions including but not limited to vaginal bleeding, contractions, leaking of fluid  and fetal movement were reviewed in detail with the patient. Please refer to After Visit Summary for other counseling recommendations.   Return in about 1 week (around 03/01/2022) for in person, ROB, Low risk.  Future Appointments  Date Time Provider Department Center  02/24/2022 10:15 AM [facility]  03/03/2022  1:50 PM Marny Lowenstein, PA-C [facility] None  03/04/2022  9:15 AM [facility] NST [facility]  03/08/2022  8:35 AM Iris Tatsch, Gigi Gin, MD [facility] None    Catalina Antigua, MD

## 2022-02-24 ENCOUNTER — Ambulatory Visit (INDEPENDENT_AMBULATORY_CARE_PROVIDER_SITE_OTHER): Payer: Medicaid Other | Admitting: General Practice

## 2022-02-24 ENCOUNTER — Other Ambulatory Visit: Payer: Self-pay | Admitting: Advanced Practice Midwife

## 2022-02-24 ENCOUNTER — Other Ambulatory Visit: Payer: Self-pay

## 2022-02-24 ENCOUNTER — Ambulatory Visit (INDEPENDENT_AMBULATORY_CARE_PROVIDER_SITE_OTHER): Payer: Medicaid Other

## 2022-02-24 ENCOUNTER — Encounter (HOSPITAL_COMMUNITY): Payer: Self-pay | Admitting: Obstetrics & Gynecology

## 2022-02-24 VITALS — BP 109/64 | HR 69

## 2022-02-24 DIAGNOSIS — O99210 Obesity complicating pregnancy, unspecified trimester: Secondary | ICD-10-CM

## 2022-02-24 NOTE — Addendum Note (Signed)
Addended by: Jaynie Collins A on: 02/24/2022 02:02 PM ? ? Modules accepted: Orders ? ?

## 2022-02-24 NOTE — Progress Notes (Signed)
Pt informed that the ultrasound is considered a limited OB ultrasound and is not intended to be a complete ultrasound exam.  Patient also informed that the ultrasound is not being completed with the intent of assessing for fetal or placental anomalies or any pelvic abnormalities.  Explained that the purpose of today?s ultrasound is to assess for  BPP, presentation, and AFI.  Patient acknowledges the purpose of the exam and the limitations of the study.    ? ?Patient's total AFI was 7 with a less than 2x2 pocket- Dr Macon Large recommends IOL today or within next couple of days. IOL scheduled for tomorrow 3/2. Discussed with patient who verbalized understanding.  ? ?Chase Caller RN BSN ?02/24/22 ? ?

## 2022-02-25 ENCOUNTER — Other Ambulatory Visit: Payer: Self-pay

## 2022-02-25 ENCOUNTER — Encounter (HOSPITAL_COMMUNITY): Payer: Self-pay | Admitting: Obstetrics & Gynecology

## 2022-02-25 ENCOUNTER — Inpatient Hospital Stay (HOSPITAL_COMMUNITY): Payer: Medicaid Other | Admitting: Anesthesiology

## 2022-02-25 ENCOUNTER — Inpatient Hospital Stay (HOSPITAL_COMMUNITY)
Admission: AD | Admit: 2022-02-25 | Discharge: 2022-02-27 | DRG: 807 | Disposition: A | Discharge status: Home / Self Care | Payer: Medicaid Other | Attending: Obstetrics & Gynecology | Admitting: Obstetrics & Gynecology

## 2022-02-25 ENCOUNTER — Inpatient Hospital Stay (HOSPITAL_COMMUNITY): Payer: Medicaid Other

## 2022-02-25 DIAGNOSIS — Z6841 Body Mass Index (BMI) 40.0 and over, adult: Secondary | ICD-10-CM

## 2022-02-25 DIAGNOSIS — O1092 Unspecified pre-existing hypertension complicating childbirth: Secondary | ICD-10-CM | POA: Diagnosis not present

## 2022-02-25 DIAGNOSIS — O4103X0 Oligohydramnios, third trimester, not applicable or unspecified: Principal | ICD-10-CM | POA: Diagnosis present

## 2022-02-25 DIAGNOSIS — O4103X1 Oligohydramnios, third trimester, fetus 1: Secondary | ICD-10-CM | POA: Diagnosis not present

## 2022-02-25 DIAGNOSIS — D649 Anemia, unspecified: Secondary | ICD-10-CM | POA: Diagnosis not present

## 2022-02-25 DIAGNOSIS — Z141 Cystic fibrosis carrier: Secondary | ICD-10-CM

## 2022-02-25 DIAGNOSIS — O99214 Obesity complicating childbirth: Secondary | ICD-10-CM | POA: Diagnosis present

## 2022-02-25 DIAGNOSIS — Z3A38 38 weeks gestation of pregnancy: Secondary | ICD-10-CM

## 2022-02-25 DIAGNOSIS — Z20822 Contact with and (suspected) exposure to covid-19: Secondary | ICD-10-CM | POA: Diagnosis not present

## 2022-02-25 DIAGNOSIS — O9902 Anemia complicating childbirth: Secondary | ICD-10-CM | POA: Diagnosis not present

## 2022-02-25 DIAGNOSIS — Z8759 Personal history of other complications of pregnancy, childbirth and the puerperium: Secondary | ICD-10-CM

## 2022-02-25 DIAGNOSIS — O09899 Supervision of other high risk pregnancies, unspecified trimester: Secondary | ICD-10-CM

## 2022-02-25 DIAGNOSIS — Z3480 Encounter for supervision of other normal pregnancy, unspecified trimester: Secondary | ICD-10-CM

## 2022-02-25 HISTORY — DX: Gestational (pregnancy-induced) hypertension without significant proteinuria, unspecified trimester: O13.9

## 2022-02-25 LAB — RESP PANEL BY RT-PCR (FLU A&B, COVID) ARPGX2
Influenza A by PCR: NEGATIVE
Influenza B by PCR: NEGATIVE
SARS Coronavirus 2 by RT PCR: NEGATIVE

## 2022-02-25 LAB — CBC
HCT: 28.6 % — ABNORMAL LOW (ref 36.0–46.0)
Hemoglobin: 8.7 g/dL — ABNORMAL LOW (ref 12.0–15.0)
MCH: 24.9 pg — ABNORMAL LOW (ref 26.0–34.0)
MCHC: 30.4 g/dL (ref 30.0–36.0)
MCV: 81.7 fL (ref 80.0–100.0)
Platelets: 188 10*3/uL (ref 150–400)
RBC: 3.5 MIL/uL — ABNORMAL LOW (ref 3.87–5.11)
RDW: 14.8 % (ref 11.5–15.5)
WBC: 7.3 10*3/uL (ref 4.0–10.5)
nRBC: 0 % (ref 0.0–0.2)

## 2022-02-25 LAB — TYPE AND SCREEN
ABO/RH(D): O POS
Antibody Screen: NEGATIVE

## 2022-02-25 LAB — RPR: RPR Ser Ql: NONREACTIVE

## 2022-02-25 MED ORDER — LACTATED RINGERS IV SOLN: INTRAVENOUS | Status: DC

## 2022-02-25 MED ORDER — LACTATED RINGERS IV SOLN
500.0000 mL | INTRAVENOUS | Status: DC | PRN
  Administered 2022-02-25: 1000 mL via INTRAVENOUS

## 2022-02-25 MED ORDER — FLEET ENEMA 7-19 GM/118ML RE ENEM: 1.0000 | ENEMA | Freq: Every day | RECTAL | Status: DC | PRN

## 2022-02-25 MED ORDER — MISOPROSTOL 25 MCG QUARTER TABLET: 25.0000 ug | ORAL_TABLET | ORAL | Status: DC | PRN

## 2022-02-25 MED ORDER — ZOLPIDEM TARTRATE 5 MG PO TABS: 5.0000 mg | ORAL_TABLET | Freq: Every evening | ORAL | Status: DC | PRN

## 2022-02-25 MED ORDER — EPHEDRINE 5 MG/ML INJ: 10.0000 mg | INTRAVENOUS | Status: DC | PRN

## 2022-02-25 MED ORDER — MISOPROSTOL 50MCG HALF TABLET
50.0000 ug | ORAL_TABLET | ORAL | Status: DC
  Administered 2022-02-25: 50 ug via BUCCAL
  Filled 2022-02-25: qty 1

## 2022-02-25 MED ORDER — OXYTOCIN BOLUS FROM INFUSION
333.0000 mL | Freq: Once | INTRAVENOUS | Status: AC
  Administered 2022-02-26: 333 mL via INTRAVENOUS

## 2022-02-25 MED ORDER — OXYCODONE-ACETAMINOPHEN 5-325 MG PO TABS: 2.0000 | ORAL_TABLET | ORAL | Status: DC | PRN

## 2022-02-25 MED ORDER — DIPHENHYDRAMINE HCL 50 MG/ML IJ SOLN: 12.5000 mg | INTRAMUSCULAR | Status: DC | PRN

## 2022-02-25 MED ORDER — FENTANYL CITRATE (PF) 100 MCG/2ML IJ SOLN: 50.0000 ug | INTRAMUSCULAR | Status: DC | PRN

## 2022-02-25 MED ORDER — PHENYLEPHRINE 40 MCG/ML (10ML) SYRINGE FOR IV PUSH (FOR BLOOD PRESSURE SUPPORT): 80.0000 ug | PREFILLED_SYRINGE | INTRAVENOUS | Status: DC | PRN

## 2022-02-25 MED ORDER — LIDOCAINE HCL (PF) 1 % IJ SOLN
INTRAMUSCULAR | Status: DC | PRN
  Administered 2022-02-25: 8 mL via EPIDURAL

## 2022-02-25 MED ORDER — TERBUTALINE SULFATE 1 MG/ML IJ SOLN: 0.2500 mg | Freq: Once | INTRAMUSCULAR | Status: DC | PRN

## 2022-02-25 MED ORDER — ONDANSETRON HCL 4 MG/2ML IJ SOLN: 4.0000 mg | Freq: Four times a day (QID) | INTRAMUSCULAR | Status: DC | PRN

## 2022-02-25 MED ORDER — SOD CITRATE-CITRIC ACID 500-334 MG/5ML PO SOLN: 30.0000 mL | ORAL | Status: DC | PRN

## 2022-02-25 MED ORDER — LIDOCAINE HCL (PF) 1 % IJ SOLN: 30.0000 mL | INTRAMUSCULAR | Status: DC | PRN

## 2022-02-25 MED ORDER — OXYTOCIN-SODIUM CHLORIDE 30-0.9 UT/500ML-% IV SOLN
2.5000 [IU]/h | INTRAVENOUS | Status: DC
  Administered 2022-02-26: 2.5 [IU]/h via INTRAVENOUS

## 2022-02-25 MED ORDER — OXYCODONE-ACETAMINOPHEN 5-325 MG PO TABS: 1.0000 | ORAL_TABLET | ORAL | Status: DC | PRN

## 2022-02-25 MED ORDER — ACETAMINOPHEN 325 MG PO TABS: 650.0000 mg | ORAL_TABLET | ORAL | Status: DC | PRN

## 2022-02-25 MED ORDER — LACTATED RINGERS IV SOLN
500.0000 mL | Freq: Once | INTRAVENOUS | Status: AC
  Administered 2022-02-25: 500 mL via INTRAVENOUS

## 2022-02-25 MED ORDER — FENTANYL-BUPIVACAINE-NACL 0.5-0.125-0.9 MG/250ML-% EP SOLN
12.0000 mL/h | EPIDURAL | Status: DC | PRN
  Administered 2022-02-25: 12 mL/h via EPIDURAL
  Filled 2022-02-25: qty 250

## 2022-02-25 MED ORDER — HYDROXYZINE HCL 50 MG PO TABS: 50.0000 mg | ORAL_TABLET | Freq: Four times a day (QID) | ORAL | Status: DC | PRN

## 2022-02-25 MED ORDER — OXYTOCIN-SODIUM CHLORIDE 30-0.9 UT/500ML-% IV SOLN
1.0000 m[IU]/min | INTRAVENOUS | Status: DC
  Administered 2022-02-25: 2 m[IU]/min via INTRAVENOUS
  Filled 2022-02-25: qty 500

## 2022-02-25 MED ORDER — FENTANYL-BUPIVACAINE-NACL 0.5-0.125-0.9 MG/250ML-% EP SOLN: 12.0000 mL/h | EPIDURAL | Status: DC | PRN

## 2022-02-25 NOTE — Anesthesia Procedure Notes (Signed)
Epidural ?Patient location during procedure: OB ?Start time: 02/25/2022 7:50 PM ?End time: 02/25/2022 8:00 PM ? ?Staffing ?Anesthesiologist: Bethena Midget, MD ? ?Preanesthetic Checklist ?Completed: patient identified, IV checked, site marked, risks and benefits discussed, surgical consent, monitors and equipment checked, pre-op evaluation and timeout performed ? ?Epidural ?Patient position: sitting ?Prep: DuraPrep and site prepped and draped ?Patient monitoring: continuous pulse ox and blood pressure ?Approach: midline ?Injection technique: LOR air ? ?Needle:  ?Needle type: Tuohy  ?Needle gauge: 17 G ?Needle length: 9 cm and 9 ?Needle insertion depth: 9 cm ?Catheter type: closed end flexible ?Catheter size: 19 Gauge ?Catheter at skin depth: 14 cm ?Test dose: negative ? ?Assessment ?Events: blood not aspirated, injection not painful, no injection resistance, no paresthesia and negative IV test ? ? ? ?

## 2022-02-25 NOTE — Progress Notes (Signed)
Labor Progress Note ?Shalia Bartko is a 21 y.o. G2P1001 at [redacted]w[redacted]d presented for IOL due to oligohydramnios. ? ?S: Resting comfortably in bed. No concerns at this time. ? ?O:  ?BP 117/64   Pulse 62   Resp 16   Ht 5\' 4"  (1.626 m)   Wt 118.2 kg   LMP 05/24/2021 (Exact Date)   BMI 44.73 kg/m?  ?EFM: Baseline FHR 140 bpm/moderate variability/+accels, no decels ? ?CVE: Dilation: 5 ?Effacement (%): 50 ?Cervical Position: Middle ?Station: -3 ?Presentation: Vertex ?Exam by:: [email or web address], MD ? ? ?A&P: 21 y.o. G2P1001 [redacted]w[redacted]d  ?#Labor: Progressing well. Foley balloon out now. S/p AROM with clear fluids. Tolerated procedure well. Will start pitocin 2x2 for augmentation. Plan to reassess in 4 hours. ?#Pain: PRN ?#FWB: Cat 1 ?#GBS negative ? ? ?[redacted]w[redacted]d, DO PGY-1 ?02/25/2022, 6:19 PM ? ? ?

## 2022-02-25 NOTE — Anesthesia Preprocedure Evaluation (Signed)
Anesthesia Evaluation  ?Patient identified by MRN, date of birth, ID band ?Patient awake ? ? ? ?Reviewed: ?Allergy & Precautions, Patient's Chart, lab work & pertinent test results ? ?Airway ?Mallampati: III ? ?TM Distance: >3 FB ?Neck ROM: Full ? ? ? Dental ?no notable dental hx. ?(+) Teeth Intact ?  ?Pulmonary ?neg pulmonary ROS,  ?  ?Pulmonary exam normal ?breath sounds clear to auscultation ? ? ? ? ? ? Cardiovascular ?hypertension, negative cardio ROS ?Normal cardiovascular exam ?Rhythm:Regular Rate:Normal ? ? ?  ?Neuro/Psych ?negative neurological ROS ? negative psych ROS  ? GI/Hepatic ?Neg liver ROS, GERD  ,  ?Endo/Other  ?Morbid obesity ? Renal/GU ?negative Renal ROS  ?negative genitourinary ?  ?Musculoskeletal ?negative musculoskeletal ROS ?(+)  ? Abdominal ?(+) + obese,   ?Peds ? Hematology ? ?(+) Blood dyscrasia, anemia ,   ?Anesthesia Other Findings ? ? Reproductive/Obstetrics ?(+) Pregnancy ? ?  ? ? ? ? ? ? ? ? ? ? ? ? ? ?  ?  ? ? ? ? ? ? ? ? ?Anesthesia Physical ? ?Anesthesia Plan ? ?ASA: 3 ? ?Anesthesia Plan: Epidural  ? ?Post-op Pain Management: Minimal or no pain anticipated  ? ?Induction:  ? ?PONV Risk Score and Plan: 2 ? ?Airway Management Planned: Natural Airway ? ?Additional Equipment: None ? ?Intra-op Plan:  ? ?Post-operative Plan:  ? ?Informed Consent: I have reviewed the patients History and Physical, chart, labs and discussed the procedure including the risks, benefits and alternatives for the proposed anesthesia with the patient or authorized representative who has indicated his/her understanding and acceptance.  ? ? ? ? ? ?Plan Discussed with: Anesthesiologist and CRNA ? ?Anesthesia Plan Comments:   ? ? ? ? ? ? ?Anesthesia Quick Evaluation ? ?

## 2022-02-25 NOTE — H&P (Addendum)
OBSTETRIC ADMISSION HISTORY AND PHYSICAL ? ?Kim Mejia is a 21 y.o. female G2P1001 with IUP at [redacted]w[redacted]d by 8-wk [place] presenting for IOL due to oligohydramnios (AFI 7.1 on 3/1 with less than 2x2 pocket). She reports +FMs, no LOF, no VB, no blurry vision, headaches, peripheral edema, or RUQ pain.  She plans on breast and bottle feeding. She requests a hormonal IUD postpartum for birth control. ? ?She received her prenatal care at [facility]. ? ?Dating: By 8-wk [place] --->  Estimated Date of Delivery: 03/10/22 ? ?Sono:   ?@[redacted]w[redacted]d , normal anatomy, transverse (head to maternal left) presentation, anterior placental lie, 3071 g, 76% EFW ? ?Prenatal History/Complications:  ?-Oligohydramnios ?-H/o gHTN ?-CF carrier ?-Elevated BMI (44) ? ?Past Medical History: ?Past Medical History:  ?Diagnosis Date  ? 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 Was evaluated by cardiology,  To have further testing, possible popliteal entrapment, consult with vascular surgery, or ortho if results neg   ? Closed fracture of distal fibula 03/10/2017  ? Right - broken when kicked during soccer game  ? Obesity   ? Pregnancy induced hypertension   ? ? ?Past Surgical History: ?Past Surgical History:  ?Procedure Laterality Date  ? ORIF FIBULA FRACTURE Right 03/14/2017  ? Procedure: RIGHT OPEN REDUCTION INTERNAL FIXATION LATERAL MALLEOLUS;  Surgeon: Eldred Manges, MD;  Location: [facility] OR;  Service: Orthopedics;  Laterality: Right;  ? ? ?Obstetrical History: ?OB History   ? ? Gravida  ?2  ? Para  ?1  ? Term  ?1  ? Preterm  ?   ? AB  ?   ? Living  ?1  ?  ? ? SAB  ?   ? IAB  ?   ? Ectopic  ?   ? Multiple  ?0  ? Live Births  ?1  ?   ?  ?  ? ? ?Social History ?Social History  ? ?Socioeconomic History  ? Marital status: Single  ?  Spouse name: Not on file  ? Number of children: Not on file  ? Years of education: Not on file  ? Highest education level: Not on file  ?Occupational History  ? Occupation: unemployed  ?Tobacco Use  ? Smoking status: Never  ? Smokeless  tobacco: Never  ? Tobacco comments:  ?  father smokes, visits on weekends  ?Vaping Use  ? Vaping Use: Never used  ?Substance and Sexual Activity  ? Alcohol use: No  ?  Alcohol/week: 0.0 standard drinks  ? Drug use: No  ? Sexual activity: Not Currently  ?  Partners: Male  ?  Birth control/protection: None  ?  Comment: currently pregnant  ?Other Topics Concern  ? Not on file  ?Social History Narrative  ? Not on file  ? ?Social Determinants of Health  ? ?Financial Resource Strain: Not on file  ?Food Insecurity: Not on file  ?Transportation Needs: Not on file  ?Physical Activity: Not on file  ?Stress: Not on file  ?Social Connections: Not on file  ? ? ?Family History: ?Family History  ?Problem Relation Age of Onset  ? Hyperlipidemia Mother   ? ? ?Allergies: ?No Known Allergies ? ?Medications Prior to Admission  ?Medication Sig Dispense Refill Last Dose  ? aspirin EC 81 MG tablet Take 1 tablet (81 mg total) by mouth daily. Take after 12 weeks for prevention of preeclampsia later in pregnancy 300 tablet 2 02/24/2022  ? Prenatal Vit-Fe Fumarate-FA (MULTIVITAMIN-PRENATAL) 27-0.8 MG TABS tablet Take 1 tablet by mouth daily at 12 noon.  02/25/2022  ? ? ? ?Review of Systems  ?All systems reviewed and negative except as stated in HPI ? ?Blood pressure 122/83, pulse 87, height 5\' 4"  (1.626 m), weight 118.2 kg, last menstrual period 05/24/2021, unknown if currently breastfeeding. ? ?General appearance: alert, cooperative, and no distress ?Lungs: normal work of breathing on room air ?Heart: regular rate, warm and well-perfused ?Abdomen: soft, non-tender; gravid ?Extremities: no LE edema, no calf tenderness to palpation ? ?Presentation: Cephalic ?Fetal monitoring: Baseline FHR 150 bpm/moderate variability/+accels, no decels ?Uterine activity: None ?Dilation: 1 ?Effacement (%): Thick ?Station: -3 ?Exam by:: Dr. [email or web address] ? ? ?Prenatal labs: ?ABO, Rh: --/--/O POS (03/02 0935) ?Antibody: NEG (03/02 0935) ?Rubella: 1.81 (08/19 1040) ?RPR:  Non Reactive (12/27 1120)  ?HBsAg: Negative (08/19 1040)  ?HIV: Non Reactive (12/27 1120)  ?GBS: Negative/-- (02/16 1113)  ?2 hr Glucola: Normal ?Genetic screening: LR NIPS, Negative Horizon, CF carrier ?Anatomy 10-26-1991: Normal ? ?Prenatal Transfer Tool  ?Maternal Diabetes: No ?Genetic Screening: Abnormal:  Results: Other: CF carrier ?Maternal Ultrasounds/Referrals: Normal ?Fetal Ultrasounds or other Referrals:  None ?Maternal Substance Abuse:  No ?Significant Maternal Medications:  None ?Significant Maternal Lab Results: Group B Strep negative ? ?Results for orders placed or performed during the hospital encounter of 02/25/22 (from the past 24 hour(s))  ?CBC  ? Collection Time: 02/25/22  9:25 AM  ?Result Value Ref Range  ? WBC 7.3 4.0 - 10.5 K/uL  ? RBC 3.50 (L) 3.87 - 5.11 MIL/uL  ? Hemoglobin 8.7 (L) 12.0 - 15.0 g/dL  ? HCT 28.6 (L) 36.0 - 46.0 %  ? MCV 81.7 80.0 - 100.0 fL  ? MCH 24.9 (L) 26.0 - 34.0 pg  ? MCHC 30.4 30.0 - 36.0 g/dL  ? RDW 14.8 11.5 - 15.5 %  ? Platelets 188 150 - 400 K/uL  ? nRBC 0.0 0.0 - 0.2 %  ?Type and screen  ? Collection Time: 02/25/22  9:35 AM  ?Result Value Ref Range  ? ABO/RH(D) PENDING   ? Antibody Screen PENDING   ? Sample Expiration    ?  02/28/2022,2359 ?Performed at [facility], [address], [place], [place] ?  ? ? ?Patient Active Problem List  ? Diagnosis Date Noted  ? History of gestational hypertension 08/14/2021  ? History of GBS (group B streptococcus) UTI, currently pregnant 08/14/2021  ? Obesity in pregnancy 12/04/2019  ? Cystic fibrosis carrier, 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  ? Supervision of other normal pregnancy, antepartum 10/02/2019  ? BMI 40.0-44.9, adult ([facility]) 03/21/2015  ? ? ?Assessment/Plan:  ?[name] is a 21 y.o. G2P1001 at [redacted]w[redacted]d here for IOL due to oligohydramnios. ? ?#Labor: Will initiate induction with buccal cytotec and Cooks balloon for cervical ripening. Cooks catheter placed without complication. Will reassess in 4 hours. ?#Pain: PRN,  considering epidural ?#FWB: Cat 1 ?#ID: GBS negative ?#MOF: Both ?#MOC: IUD postpartum - outpatient  ?#Circ: Yes, inpatient ? ?#Oligohydramnios: AFI 7.1 cm on 02/24/22 with less than 2x2 pocket. Will monitor fetal well being in labor closely.  ? ?#H/o gHTN: BP's have been well controlled throughout prenatal course. Will continue to monitor.  ? ?04/26/22, DO PGY-1 ?02/25/2022, 10:26 AM ? ?GME ATTESTATION:  ?I saw and evaluated the patient. I agree with the findings and the plan of care as documented in the resident?s note. I have made changes to documentation as necessary. ? ?IOL for oligohydramnios. Cook's catheter placed upon admission. Will also start buccal Cytotec. Cat 1 tracing. Will reassess in 4 hours.  ? ?  Evalina Field, MD ?OB Fellow, Faculty Practice ?[place], Center for [facility] ?02/25/2022 12:18 PM ? ? ?

## 2022-02-25 NOTE — Progress Notes (Signed)
FHR Cat 1. No contractions.  Cx 1/thick/-3/vtx. Cooks catheter inserted and inflated w/80cc H20.  Dr. Mathis Fare to assume care after she gets done w/a C section.  ?

## 2022-02-25 NOTE — Progress Notes (Signed)
Labor Progress Note ?Kim Mejia is a 21 y.o. G2P1001 at [redacted]w[redacted]d presented for IOL d/t oligohydramnios ?S: Patient is resting comfortably s/p Epidural. ? ?O:  ?BP 136/81   Pulse (!) 59   Temp 98.7 ?F (37.1 ?C) (Oral)   Resp 18   Ht 5\' 4"  (1.626 m)   Wt 118.2 kg   LMP 05/24/2021 (Exact Date)   BMI 44.73 kg/m?  ?EFM: 120s to 130s baseline/+accels/moderate variability ? ?CVE: Dilation: 6.5 ?Effacement (%): 70 ?Cervical Position: Middle ?Station: -3 ?Presentation: Vertex ?Exam by:: [email or web address], RN ? ?A&P: 21 y.o. G2P1001 108w1d  ?#Labor: Progressing well. Dilated to 6.5 currently. Pitocin titrating up as tolerated  ?#Pain: Epidural in place ?#FWB: Cat I ?#GBS negative ? ?[redacted]w[redacted]d, MD ?Center for [place] [facility], [facility] ?9:32 PM  ?

## 2022-02-26 ENCOUNTER — Encounter (HOSPITAL_COMMUNITY): Payer: Self-pay | Admitting: Obstetrics & Gynecology

## 2022-02-26 DIAGNOSIS — Z3A38 38 weeks gestation of pregnancy: Secondary | ICD-10-CM | POA: Diagnosis not present

## 2022-02-26 DIAGNOSIS — O4103X1 Oligohydramnios, third trimester, fetus 1: Secondary | ICD-10-CM | POA: Diagnosis not present

## 2022-02-26 LAB — BIRTH TISSUE RECOVERY COLLECTION (PLACENTA DONATION)

## 2022-02-26 MED ORDER — DIPHENHYDRAMINE HCL 25 MG PO CAPS: 25.0000 mg | ORAL_CAPSULE | Freq: Four times a day (QID) | ORAL | Status: DC | PRN

## 2022-02-26 MED ORDER — PRENATAL MULTIVITAMIN CH
1.0000 | ORAL_TABLET | Freq: Every day | ORAL | Status: DC
  Administered 2022-02-26 – 2022-02-27 (×2): 1 via ORAL
  Filled 2022-02-26 (×2): qty 1

## 2022-02-26 MED ORDER — DIBUCAINE (PERIANAL) 1 % EX OINT: 1.0000 "application " | TOPICAL_OINTMENT | CUTANEOUS | Status: DC | PRN

## 2022-02-26 MED ORDER — BENZOCAINE-MENTHOL 20-0.5 % EX AERO
1.0000 "application " | INHALATION_SPRAY | CUTANEOUS | Status: DC | PRN
  Administered 2022-02-26: 1 via TOPICAL
  Filled 2022-02-26: qty 56

## 2022-02-26 MED ORDER — ZOLPIDEM TARTRATE 5 MG PO TABS: 5.0000 mg | ORAL_TABLET | Freq: Every evening | ORAL | Status: DC | PRN

## 2022-02-26 MED ORDER — ONDANSETRON HCL 4 MG/2ML IJ SOLN: 4.0000 mg | INTRAMUSCULAR | Status: DC | PRN

## 2022-02-26 MED ORDER — TETANUS-DIPHTH-ACELL PERTUSSIS 5-2.5-18.5 LF-MCG/0.5 IM SUSY: 0.5000 mL | PREFILLED_SYRINGE | Freq: Once | INTRAMUSCULAR | Status: DC

## 2022-02-26 MED ORDER — IBUPROFEN 600 MG PO TABS
600.0000 mg | ORAL_TABLET | Freq: Four times a day (QID) | ORAL | Status: DC
  Administered 2022-02-26 – 2022-02-27 (×7): 600 mg via ORAL
  Filled 2022-02-26 (×7): qty 1

## 2022-02-26 MED ORDER — SIMETHICONE 80 MG PO CHEW: 80.0000 mg | CHEWABLE_TABLET | ORAL | Status: DC | PRN

## 2022-02-26 MED ORDER — SENNOSIDES-DOCUSATE SODIUM 8.6-50 MG PO TABS
2.0000 | ORAL_TABLET | Freq: Every day | ORAL | Status: DC
  Administered 2022-02-27: 2 via ORAL
  Filled 2022-02-26: qty 2

## 2022-02-26 MED ORDER — ACETAMINOPHEN 325 MG PO TABS
650.0000 mg | ORAL_TABLET | ORAL | Status: DC | PRN
  Administered 2022-02-26: 650 mg via ORAL
  Filled 2022-02-26: qty 2

## 2022-02-26 MED ORDER — MEDROXYPROGESTERONE ACETATE 150 MG/ML IM SUSP: 150.0000 mg | INTRAMUSCULAR | Status: DC | PRN

## 2022-02-26 MED ORDER — WITCH HAZEL-GLYCERIN EX PADS: 1.0000 "application " | MEDICATED_PAD | CUTANEOUS | Status: DC | PRN

## 2022-02-26 MED ORDER — COCONUT OIL OIL: 1.0000 "application " | TOPICAL_OIL | Status: DC | PRN

## 2022-02-26 MED ORDER — ONDANSETRON HCL 4 MG PO TABS: 4.0000 mg | ORAL_TABLET | ORAL | Status: DC | PRN

## 2022-02-26 NOTE — Anesthesia Postprocedure Evaluation (Signed)
Anesthesia Post Note ? ?Patient: Kim Mejia ? ?Procedure(s) Performed: AN AD HOC LABOR EPIDURAL ? ?  ? ?Patient location during evaluation: Mother Baby ?Anesthesia Type: Epidural ?Level of consciousness: awake and alert ?Pain management: pain level controlled ?Vital Signs Assessment: post-procedure vital signs reviewed and stable ?Respiratory status: spontaneous breathing, nonlabored ventilation and respiratory function stable ?Cardiovascular status: stable ?Postop Assessment: no headache, no backache and epidural receding ?Anesthetic complications: no ? ? ?No notable events documented. ? ?Last Vitals:  ?Vitals:  ? 02/26/22 0231 02/26/22 0247  ?BP: 120/73 135/84  ?Pulse: 81 73  ?Resp:    ?Temp:    ?  ?Last Pain:  ?Vitals:  ? 02/25/22 2300  ?TempSrc: Oral  ?PainSc:   ? ?Pain Goal:   ? ?  ?  ?  ?  ?  ?  ?  ? ?Kim Mejia ? ? ? ? ?

## 2022-02-26 NOTE — Progress Notes (Signed)
Labor Progress Note ?Kim Mejia is a 21 y.o. G2P1001 at [redacted]w[redacted]d presented for IOL d/t oligohydramnios ?S: Patient is resting comfortably s/p Epidural. Not feeling urge to push or pressure currently ? ?O:  ?BP 129/81   Pulse 79   Temp 98.6 ?F (37 ?C) (Oral)   Resp 18   Ht 5\' 4"  (1.626 m)   Wt 118.2 kg   LMP 05/24/2021 (Exact Date)   BMI 44.73 kg/m?  ?EFM: 125-130s baseline/+accels, some variables overall reassuring ? ?CVE: Dilation: 8.5 ?Effacement (%): 80 ?Cervical Position: Middle ?Station: -2 ?Presentation: Vertex ?Exam by:: [email or web address], RN ? ?A&P: 21 y.o. G2P1001 [redacted]w[redacted]d  ?#Labor: Progressing well. Dilated to 8.5 currently. Pitocin on  ?#Pain: Epidural in place ?#FWB: some variables overall reassuring ?#GBS negative ? ?[redacted]w[redacted]d, MD ?Center for [place] [facility], [facility] ?1:05 AM  ?

## 2022-02-26 NOTE — Lactation Note (Signed)
This note was copied from a baby's chart. ?Lactation Consultation Note ? ?Patient Name: Kim Mejia ?Today's Date: 02/26/2022 ?Reason for consult: Initial assessment;Early term 37-38.6wks ?Age:23 hours ? ?Mom states baby hasn't eaten since 7am.  Mom would like assistance with latching baby deeper and without pain.   ?LC suggested STS.  Hand expression reviewed.  Infant was too sleepy to attempt to latch.   ? ?Mom was comfortable in recliner and LC reviewed football position, pillow support, and brining baby to the breast when his mouth is wide.. ? ?LC was unable wake baby so "julian" was placed STS.   ? ?LC suggested feeding with cues, 8-12 times in 24 hours, hand expressing prior to latching, and STS often. ? ?Mom is aware of OP services, support group, and phone line.  She is knows to call out when assistance is needed. ? ?Maternal Data ?Does the patient have breastfeeding experience prior to this delivery?: Yes ?How long did the patient breastfeed?: 3 months with previous son; stopped due to low milk supply ? ?Feeding ?Mother's Current Feeding Choice: Breast Milk ? ?LATCH Score ?Latch: Too sleepy or reluctant, no latch achieved, no sucking elicited. ? ?Audible Swallowing: None ? ?Type of Nipple: Everted at rest and after stimulation ? ?Comfort (Breast/Nipple): Soft / non-tender ? ?Hold (Positioning): Assistance needed to correctly position infant at breast and maintain latch. ? ?LATCH Score: 5 ? ? ?Lactation Tools Discussed/Used ?  ? ?Interventions ?Interventions: Breast feeding basics reviewed;Skin to skin;Hand express;Support pillows;Position options;Education ? ?Discharge ?Pump: Personal (medela) ? ?Consult Status ?Consult Status: Follow-up ?Date: 02/27/22 ?Follow-up type: In-patient ? ? ? ?Maryruth Hancock Zuri Lascala ?02/26/2022, 11:01 AM ? ? ? ?

## 2022-02-26 NOTE — Discharge Summary (Addendum)
?  Postpartum Discharge Summary ? ? ?   ?Patient Name: Kim Mejia ?DOB: 2001/10/13 ?MRN: [id number] ? ?Date of admission: 02/25/2022 ?Delivery date:02/26/2022  ?Delivering provider: Renard Matter  ?Date of discharge: 02/27/2022 ? ?Admitting diagnosis: Oligohydramnios antepartum, third trimester, other fetus [O41.03X9] ?Intrauterine pregnancy: [redacted]w[redacted]d    ?Secondary diagnosis:  Active Problems: ?  BMI 40.0-44.9, adult ([place] ?  Supervision of other normal pregnancy, antepartum ?  Cystic fibrosis carrier, antepartum ?  History of gestational hypertension ?  Vaginal delivery ? ?Additional problems: None    ?Discharge diagnosis: Term Pregnancy Delivered                                              ?Post partum procedures: None ?Augmentation: AROM, Cytotec, and IP Foley ?Complications: None ? ?Hospital course: Induction of Labor With Vaginal Delivery   ?21y.o. yo G2P1001 at 346w2das admitted to the hospital 02/25/2022 for induction of labor.  Indication for induction:  oligohydramnios .  Patient had an uncomplicated labor course as follows: ?Membrane Rupture Time/Date: 6:08 PM ,02/25/2022   ?Delivery Method:Vaginal, Spontaneous  ?Episiotomy: None  ?Lacerations:  1st degree  ?Details of delivery can be found in separate delivery note.  Patient had a routine postpartum course. Patient is discharged home 02/27/22. ? ?Newborn Data: ?Birth date:02/26/2022  ?Birth time:2:14 AM  ?Gender:Female  ?Living status:Living  ?Apgars:8 ,9  ?Weight:3210 g  ? ?Magnesium Sulfate received: No ?BMZ received: No ?Rhophylac:N/A ?MMR:N/A ?T-DaPL declined ?Flu: declined ?Transfusion:No ? ?Physical exam  ?Vitals:  ? 02/26/22 1000 02/26/22 1423 02/26/22 2224 02/27/22 0511  ?BP: 120/67 111/70 103/63 (!) 126/58  ?Pulse: 74 73 73 (!) 56  ?Resp: '16 18 16 16  ' ?Temp: 98.7 ?F (37.1 ?C) 98.3 ?F (36.8 ?C) 97.7 ?F (36.5 ?C) 97.6 ?F (36.4 ?C)  ?TempSrc: Oral Oral Oral Oral  ?SpO2: 96%     ?Weight:      ?Height:      ? ?General: alert, cooperative, and no distress ?Lochia:  appropriate ?Uterine Fundus: firm ?Incision: N/A ?DVT Evaluation: No evidence of DVT seen on physical exam. ?No significant calf/ankle edema. ?Labs: ?Lab Results  ?Component Value Date  ? WBC 7.3 02/25/2022  ? HGB 8.7 (L) 02/25/2022  ? HCT 28.6 (L) 02/25/2022  ? MCV 81.7 02/25/2022  ? PLT 188 02/25/2022  ? ?CMP Latest Ref Rng & Units 08/14/2021  ?Glucose 65 - 99 mg/dL 89  ?BUN 6 - 20 mg/dL 5(L)  ?Creatinine 0.57 - 1.00 mg/dL 0.58  ?Sodium 134 - 144 mmol/L 138  ?Potassium 3.5 - 5.2 mmol/L 3.8  ?Chloride 96 - 106 mmol/L 102  ?CO2 20 - 29 mmol/L 21  ?Calcium 8.7 - 10.2 mg/dL 8.9  ?Total Protein 6.0 - 8.5 g/dL 6.6  ?Total Bilirubin 0.0 - 1.2 mg/dL 0.3  ?Alkaline Phos 44 - 121 IU/L 98  ?AST 0 - 40 IU/L 11  ?ALT 0 - 32 IU/L 8  ? ?Edinburgh Score: ?Edinburgh Postnatal Depression Scale Screening Tool 02/26/2022  ?I have been able to laugh and see the funny side of things. 0  ?I have looked forward with enjoyment to things. 0  ?I have blamed myself unnecessarily when things went wrong. 2  ?I have been anxious or worried for no good reason. 0  ?I have felt scared or panicky for no good reason. 1  ?Things have been getting on top of  me. 1  ?I have been so unhappy that I have had difficulty sleeping. 0  ?I have felt sad or miserable. 0  ?I have been so unhappy that I have been crying. 0  ?The thought of harming myself has occurred to me. 0  ?Edinburgh Postnatal Depression Scale Total 4  ? ? ? ?After visit meds:  ?Allergies as of 02/27/2022   ?No Known Allergies ?  ? ?  ?Medication List  ?  ? ?STOP taking these medications   ? ?aspirin EC 81 MG tablet ?  ? ?  ? ?TAKE these medications   ? ?acetaminophen 325 MG tablet ?Commonly known as: Tylenol ?Take 2 tablets (650 mg total) by mouth every 4 (four) hours as needed (for pain scale < 4). ?  ?ibuprofen 600 MG tablet ?Commonly known as: ADVIL ?Take 1 tablet (600 mg total) by mouth every 6 (six) hours. ?  ?multivitamin-prenatal 27-0.8 MG Tabs tablet ?Take 1 tablet by mouth daily at 12  noon. ?  ? ?  ? ? ? ?Discharge home in stable condition ?Infant Feeding: Breast ?Infant Disposition:home with mother ?Discharge instruction: per After Visit Summary and Postpartum booklet. ?Activity: Advance as tolerated. Pelvic rest for 6 weeks.  ?Diet: routine diet ?Future Appointments: ?Future Appointments  ?Date Time Provider [place]  ?04/12/2022 10:55 AM Constant, Vickii Chafe, MD [facility] None  ? ?Follow up Visit: ?Message sent to [facility] by Dr. Cy Blamer on 3/3 ? ?Please schedule this patient for a In person postpartum visit in 6 weeks with the following provider: Any provider. ?Additional Postpartum F/U: None   ?High risk pregnancy complicated by:  Oligohydramnios ?Delivery mode:  Vaginal, Spontaneous  ?Anticipated Birth Control:   outpt IUD ? ? ?02/27/2022 ?Gerrit Heck, MD ? ? ? ?GME ATTESTATION:  ?I saw and evaluated the patient. I agree with the findings and the plan of care as documented in the resident?s note and have made all necessary edits. ? ?Renard Matter, MD, MPH ?OB Fellow, Faculty Practice ?[place] for [facility] ?02/27/2022 9:29 AM ? ? ?

## 2022-02-27 MED ORDER — ACETAMINOPHEN 325 MG PO TABS: 650.0000 mg | ORAL_TABLET | ORAL | 0 refills | Status: DC | PRN

## 2022-02-27 MED ORDER — IBUPROFEN 600 MG PO TABS: 600.0000 mg | ORAL_TABLET | Freq: Four times a day (QID) | ORAL | 0 refills | Status: DC

## 2022-02-27 NOTE — Lactation Note (Signed)
This note was copied from a baby's chart. ?Lactation Consultation Note ? ?Patient Name: Kim Mejia ?Today's Date: 02/27/2022 ?Reason for consult: Follow-up assessment;Breastfeeding assistance;Infant weight loss;Early term 37-38.6wks ?Age:23 hours ? ? ?Concern of 7 % wt. Loss in 30 hours.   Baby has  ?7 voids ?3 stools ? ?LC observed latch.  Infant does not open wide and was latched only to the nipple but swallowing.  LC worked with mom and dad to latch deeper, lining nipple and nose up prior to latching. ? ?Mom used compression and multiple swallowing were easily heard and rhythmic sucking noted and deep jaw excursions.   ?Mom denied pain.  Baby fed on both breasts and had arms relaxed while feeding on the second side.  ? ?LC encouraged to keep baby stimulated throughout feeding and make sure swallows are being heard. ? ?Mom has an electric pump, hand pump, and knows how to spoon feed.  LC encouraged feeding with cues then supplementing with her EBM if needed after BF.   ? ?Maternal Data ?Has patient been taught Hand Expression?: Yes ? ?Feeding ?Mother's Current Feeding Choice: Breast Milk ? ?LATCH Score ?Latch: Grasps breast easily, tongue down, lips flanged, rhythmical sucking. ? ?Audible Swallowing: Spontaneous and intermittent ? ?Type of Nipple: Everted at rest and after stimulation ? ?Comfort (Breast/Nipple): Filling, red/small blisters or bruises, mild/mod discomfort (slight discomfort at times) ? ?Hold (Positioning): Assistance needed to correctly position infant at breast and maintain latch. ? ?LATCH Score: 8 ? ? ?Lactation Tools Discussed/Used ?  ? ?Interventions ?Interventions: Breast compression;Breast feeding basics reviewed;Education;Support pillows;Position options;Adjust position ? ?Discharge ?Discharge Education: Engorgement and breast care;Warning signs for feeding baby ?Pump: Personal;Manual (RN to give manual, mom has electric) ? ?Consult Status ?Consult Status: Complete ?Date:  02/27/22 ?Follow-up type: In-patient ? ? ? ?Maryruth Hancock Delyle Weider ?02/27/2022, 9:57 AM ? ? ? ?

## 2022-03-01 ENCOUNTER — Telehealth: Payer: Self-pay

## 2022-03-01 NOTE — Telephone Encounter (Signed)
Transition Care Management Unsuccessful Follow-up Telephone Call ? ?Date of discharge and from where:  02/27/2022 from [facility] ? ?Attempts:  1st Attempt ? ?Reason for unsuccessful TCM follow-up call:  Left voice message ? ? ? ?

## 2022-03-02 NOTE — Telephone Encounter (Signed)
Transition Care Management Unsuccessful Follow-up Telephone Call ? ?Date of discharge and from where:  02/27/2022 from [facility] ? ?Attempts:  2nd Attempt ? ?Reason for unsuccessful TCM follow-up call:  Left voice message ? ? ? ?

## 2022-03-03 ENCOUNTER — Encounter: Payer: Medicaid Other | Admitting: Medical

## 2022-03-03 NOTE — Telephone Encounter (Signed)
Transition Care Management Unsuccessful Follow-up Telephone Call ? ?Date of discharge and from where:  02/27/2022 from [facility] ? ?Attempts:  3rd Attempt ? ?Reason for unsuccessful TCM follow-up call:  Unable to reach patient ? ?  ?

## 2022-03-04 ENCOUNTER — Other Ambulatory Visit: Payer: Medicaid Other

## 2022-03-08 ENCOUNTER — Encounter: Payer: Medicaid Other | Admitting: Obstetrics and Gynecology

## 2022-03-09 ENCOUNTER — Telehealth (HOSPITAL_COMMUNITY): Payer: Self-pay | Admitting: *Deleted

## 2022-03-09 NOTE — Telephone Encounter (Signed)
Phone voicemail message left to return nurse call. ? ?Duffy Rhody, RN 03-09-2022 at 2:26pm ?

## 2022-04-12 ENCOUNTER — Ambulatory Visit (INDEPENDENT_AMBULATORY_CARE_PROVIDER_SITE_OTHER): Payer: Medicaid Other | Admitting: Obstetrics and Gynecology

## 2022-04-12 ENCOUNTER — Encounter: Payer: Self-pay | Admitting: Obstetrics and Gynecology

## 2022-04-12 NOTE — Progress Notes (Signed)
? ? ?Post Partum Visit Note ? ?Kim Mejia is a 21 y.o. G85P2002 female who presents for a postpartum visit. She is 6 weeks postpartum following a normal spontaneous vaginal delivery.  I have fully reviewed the prenatal and intrapartum course. The delivery was at 38.2 gestational weeks.  Anesthesia: epidural. Postpartum course has been uncomplicated. Baby is doing well. Baby is feeding by bottle - Gerber Gentle . Bleeding no bleeding. Bowel function is normal. Bladder function is normal. Patient is not sexually active. Contraception method is abstinence. Pt is unsure about method, will discuss.  ? ?Postpartum depression screening: negative, score 0. ? ? ?The pregnancy intention screening data noted above was reviewed. Potential methods of contraception were discussed. The patient elected to proceed with No data recorded. ? ? Edinburgh Postnatal Depression Scale - 04/12/22 1110   ? ?  ? Edinburgh Postnatal Depression Scale:  In the Past 7 Days  ? I have been able to laugh and see the funny side of things. 0   ? I have looked forward with enjoyment to things. 0   ? I have blamed myself unnecessarily when things went wrong. 0   ? I have been anxious or worried for no good reason. 0   ? I have felt scared or panicky for no good reason. 0   ? Things have been getting on top of me. 0   ? I have been so unhappy that I have had difficulty sleeping. 0   ? I have felt sad or miserable. 0   ? I have been so unhappy that I have been crying. 0   ? The thought of harming myself has occurred to me. 0   ? Edinburgh Postnatal Depression Scale Total 0   ? ?  ?  ? ?  ? ? ?Health Maintenance Due  ?Topic Date Due  ? COVID-19 Vaccine (1) Never done  ? HPV VACCINES (1 - 2-dose series) Never done  ? ? ?  ? ?Review of Systems ?Pertinent items noted in HPI and remainder of comprehensive ROS otherwise negative. ? ?Objective:  ?BP 111/75   Pulse 71   Wt 237 lb (107.5 kg)   LMP 04/05/2022 (Exact Date)   BMI 40.68 kg/m?   ? ?General:   alert, cooperative, and no distress  ? Breasts:  normal  ?Lungs: clear to auscultation bilaterally  ?Heart:  regular rate and rhythm  ?Abdomen: soft, non-tender; bowel sounds normal; no masses,  no organomegaly   ?Wound   ?GU exam:  normal  ?     ?Assessment:  ? ? There are no diagnoses linked to this encounter. ? ?Normal postpartum exam.  ? ?Plan:  ? ?Essential components of care per ACOG recommendations: ? ?1.  Mood and well being: Patient with negative depression screening today. Reviewed local resources for support.  ?- Patient tobacco use? No.   ?- hx of drug use? No.   ? ?2. Infant care and feeding:  ?-Patient currently breastmilk feeding? No.  ?-Social determinants of health (SDOH) reviewed in EPIC. No concerns ? ?3. Sexuality, contraception and birth spacing ?- Patient does not want a pregnancy in the next year.  Desired family size is 2 children.  ?- Reviewed reproductive life planning. Reviewed contraceptive methods based on pt preferences and effectiveness.  Patient desired IUD or IUS at a later visit   ?- Discussed birth spacing of 18 months ? ?4. Sleep and fatigue ?-Encouraged family/partner/community support of 4 hrs of uninterrupted sleep to help with mood  and fatigue ? ?5. Physical Recovery  ?- Discussed patients delivery and complications. She describes her labor as good. ?- Patient had a Vaginal, no problems at delivery. Patient had a 1st degree laceration. Perineal healing reviewed. Patient expressed understanding ?- Patient has urinary incontinence? No. ?- Patient is safe to resume physical and sexual activity ? ?6.  Health Maintenance ?- HM due items addressed Yes ?- Last pap smear  ?Diagnosis  ?Date Value Ref Range Status  ?08/14/2021   Final  ? - Negative for intraepithelial lesion or malignancy (NILM)  ? Pap smear not done at today's visit.  ?-Breast Cancer screening indicated? No.  ? ?7. Chronic Disease/Pregnancy Condition follow up: None ? ?- PCP follow up ? ?Mora Bellman, MD ?Center for  [place] ? ?

## 2022-04-13 ENCOUNTER — Ambulatory Visit: Payer: Medicaid Other

## 2022-04-13 VITALS — BP 117/77 | HR 72 | Ht 62.0 in | Wt 237.4 lb

## 2022-04-13 DIAGNOSIS — Z01812 Encounter for preprocedural laboratory examination: Secondary | ICD-10-CM | POA: Diagnosis not present

## 2022-04-13 DIAGNOSIS — Z30430 Encounter for insertion of intrauterine contraceptive device: Secondary | ICD-10-CM

## 2022-04-13 LAB — POCT URINE PREGNANCY: Preg Test, Ur: NEGATIVE

## 2022-04-13 MED ORDER — LEVONORGESTREL 20.1 MCG/DAY IU IUD
1.0000 | INTRAUTERINE_SYSTEM | Freq: Once | INTRAUTERINE | Status: AC
  Administered 2022-04-13: 1 via INTRAUTERINE

## 2022-04-13 NOTE — Progress Notes (Signed)
Patient presents for IUD insertion. Patient denies having intercourse since yesterday PP visit. Patient has no concerns today. ?

## 2022-04-13 NOTE — Progress Notes (Signed)
? ? ?  GYNECOLOGY OFFICE PROCEDURE NOTE ? ?Kim Mejia is a 21 y.o. [id number] here for IUD insertion. No GYN concerns.  Last pap smear was on Aug 14, 2021 and was normal. ? ?IUD Insertion Procedure Note ?IUD: Liletta  Exp: 03/2023  Lot: [id number] ?SN: [id number] ? ?Patient identified, informed consent performed, consent signed.   Discussed risks of irregular bleeding, cramping, infection, malpositioning or misplacement of the IUD outside the uterus which may require further procedure such as laparoscopy. Time out was performed.  Urine pregnancy test negative. ? ?Bimanual exam performed and uterus of normal size, non-tender, and anteverted position. Speculum placed in the vagina and cervix visualized.  Cervix and vaginal walls cleaned x 4 with betadine solution. Anterior aspect grasped with a single tooth tenaculum.  Uterus sounded to 8 cm.  Liletta IUD placed per manufacturer's recommendations.  Strings trimmed to ~3 cm. Tenaculum was removed, good hemostasis noted.  Patient tolerated procedure well.  ? ?Patient was given post-procedure instructions.  She was advised to have backup contraception for one week.  Patient was instructed to check IUD strings after menses or every 2 months in the absence of menses. Patient also instructed to follow up in 4 weeks for IUD check and call and/or report any issues prior to next visit. ? ?Cherre Robins, CNM ?04/13/2022  ? ?

## 2022-04-13 NOTE — Progress Notes (Signed)
Administrations This Visit   ? ? levonorgestrel (LILETTA) 20.1 MCG/DAY IUD 1 each   ? ? Admin Date ?04/13/2022 Action ?Given Dose ?1 each Route ?Intrauterine Administered By ?Maretta Bees, RMA  ? ?  ?  ? ?  ?  ?UPT Today is Negative ?

## 2022-05-10 NOTE — Progress Notes (Signed)
? ?  GYNECOLOGY OFFICE VISIT NOTE ? ?History:  ?21 y.o. [id number] here today for liletta IUD string check. Placed on 4/18. She denies any abnormal vaginal discharge, bleeding, pelvic pain or other concerns.  ? ?The following portions of the patient's history were reviewed and updated as appropriate: allergies, current medications, past family history, past medical history, past social history, past surgical history and problem list.  ? ? ? ?Review of Systems:  ?Pertinent items noted in HPI ?Review of Systems  ?All other systems reviewed and are negative. ? ?Objective:  ?Physical Exam ?BP 110/81   Pulse 67   Ht 5\' 2"  (1.575 m)   Wt 240 lb 6.4 oz (109 kg)   LMP 05/06/2022 (Approximate)   BMI 43.97 kg/m?  ?Physical Exam ?Vitals and nursing note reviewed. Exam conducted with a chaperone present.  ?Cardiovascular:  ?   Rate and Rhythm: Normal rate.  ?   Pulses: Normal pulses.  ?Pulmonary:  ?   Effort: Pulmonary effort is normal.  ?Genitourinary: ?   General: Normal vulva.  ?   Comments: Small amount of blood at introitus (patient on period). Purple IUD strings visualized on exam 3-4 cm from os. ?Neurological:  ?   Mental Status: She is alert.  ? ? ?Labs and Imaging ?No results found for this or any previous visit (from the past 168 hour(s)). ?No results found. ? ?Assessment & Plan:  ?IUD string check ?Strings confirmed to be in place. Not too long and not bothering patient so no trimming done. Follow up PRN ? ?Routine preventative health maintenance measures emphasized. ?Please refer to After Visit Summary for other counseling recommendations.  ? ?Return if symptoms worsen or fail to improve. ? ? ? ?07/06/2022, MD, MPH ?OB Fellow, Faculty Practice ?Center for [facility], [facility] ? ?

## 2022-05-11 ENCOUNTER — Encounter: Payer: Self-pay | Admitting: Family Medicine

## 2022-05-11 ENCOUNTER — Ambulatory Visit: Payer: Medicaid Other | Admitting: Family Medicine

## 2022-05-11 VITALS — BP 110/81 | HR 67 | Ht 62.0 in | Wt 240.4 lb

## 2022-05-11 DIAGNOSIS — Z30431 Encounter for routine checking of intrauterine contraceptive device: Secondary | ICD-10-CM | POA: Diagnosis not present

## 2022-05-20 ENCOUNTER — Ambulatory Visit: Payer: Medicaid Other | Admitting: Obstetrics

## 2022-11-24 ENCOUNTER — Other Ambulatory Visit: Payer: Self-pay

## 2022-11-24 ENCOUNTER — Emergency Department (HOSPITAL_COMMUNITY)
Admission: EM | Admit: 2022-11-24 | Discharge: 2022-11-24 | Disposition: A | Discharge status: Home / Self Care | Payer: Medicaid Other | Attending: Emergency Medicine | Admitting: Emergency Medicine

## 2022-11-24 ENCOUNTER — Encounter (HOSPITAL_COMMUNITY): Payer: Self-pay

## 2022-11-24 ENCOUNTER — Emergency Department (HOSPITAL_COMMUNITY): Payer: Medicaid Other

## 2022-11-24 DIAGNOSIS — Z975 Presence of (intrauterine) contraceptive device: Secondary | ICD-10-CM | POA: Diagnosis not present

## 2022-11-24 DIAGNOSIS — R1084 Generalized abdominal pain: Secondary | ICD-10-CM | POA: Diagnosis not present

## 2022-11-24 DIAGNOSIS — R188 Other ascites: Secondary | ICD-10-CM | POA: Diagnosis not present

## 2022-11-24 DIAGNOSIS — R102 Pelvic and perineal pain: Secondary | ICD-10-CM | POA: Diagnosis not present

## 2022-11-24 LAB — COMPREHENSIVE METABOLIC PANEL
ALT: 15 U/L (ref 0–44)
AST: 17 U/L (ref 15–41)
Albumin: 3.6 g/dL (ref 3.5–5.0)
Alkaline Phosphatase: 84 U/L (ref 38–126)
Anion gap: 7 (ref 5–15)
BUN: 8 mg/dL (ref 6–20)
CO2: 22 mmol/L (ref 22–32)
Calcium: 8.7 mg/dL — ABNORMAL LOW (ref 8.9–10.3)
Chloride: 106 mmol/L (ref 98–111)
Creatinine, Ser: 0.59 mg/dL (ref 0.44–1.00)
GFR, Estimated: >60 mL/min (ref >60)
Glucose, Bld: 104 mg/dL — ABNORMAL HIGH (ref 70–99)
Potassium: 3.6 mmol/L (ref 3.5–5.1)
Sodium: 135 mmol/L (ref 135–145)
Total Bilirubin: 0.4 mg/dL (ref 0.3–1.2)
Total Protein: 6.7 g/dL (ref 6.5–8.1)

## 2022-11-24 LAB — CBC WITH DIFFERENTIAL/PLATELET
Abs Immature Granulocytes: 0.03 10*3/uL (ref 0.00–0.07)
Basophils Absolute: 0 10*3/uL (ref 0.0–0.1)
Basophils Relative: 0 %
Eosinophils Absolute: 0.1 10*3/uL (ref 0.0–0.5)
Eosinophils Relative: 1 %
HCT: 39 % (ref 36.0–46.0)
Hemoglobin: 12.7 g/dL (ref 12.0–15.0)
Immature Granulocytes: 0 %
Lymphocytes Relative: 17 %
Lymphs Abs: 1.7 10*3/uL (ref 0.7–4.0)
MCH: 29.5 pg (ref 26.0–34.0)
MCHC: 32.6 g/dL (ref 30.0–36.0)
MCV: 90.7 fL (ref 80.0–100.0)
Monocytes Absolute: 0.6 10*3/uL (ref 0.1–1.0)
Monocytes Relative: 7 %
Neutro Abs: 7.1 10*3/uL (ref 1.7–7.7)
Neutrophils Relative %: 75 %
Platelets: 235 10*3/uL (ref 150–400)
RBC: 4.3 MIL/uL (ref 3.87–5.11)
RDW: 13.6 % (ref 11.5–15.5)
WBC: 9.6 10*3/uL (ref 4.0–10.5)
nRBC: 0 % (ref 0.0–0.2)

## 2022-11-24 LAB — URINALYSIS, ROUTINE W REFLEX MICROSCOPIC
Bilirubin Urine: NEGATIVE
Glucose, UA: NEGATIVE mg/dL
Hgb urine dipstick: NEGATIVE
Ketones, ur: NEGATIVE mg/dL
Leukocytes,Ua: NEGATIVE
Nitrite: NEGATIVE
Protein, ur: NEGATIVE mg/dL
Specific Gravity, Urine: 1.029 (ref 1.005–1.030)
pH: 5 (ref 5.0–8.0)

## 2022-11-24 LAB — PREGNANCY, URINE: Preg Test, Ur: NEGATIVE

## 2022-11-24 LAB — I-STAT BETA HCG BLOOD, ED (MC, WL, AP ONLY): I-stat hCG, quantitative: <5 m[IU]/mL (ref <5)

## 2022-11-24 LAB — LIPASE, BLOOD: Lipase: 31 U/L (ref 11–51)

## 2022-11-24 MED ORDER — ONDANSETRON HCL 4 MG/2ML IJ SOLN
4.0000 mg | Freq: Once | INTRAMUSCULAR | Status: AC
  Administered 2022-11-24: 4 mg via INTRAVENOUS
  Filled 2022-11-24: qty 2

## 2022-11-24 MED ORDER — KETOROLAC TROMETHAMINE 15 MG/ML IJ SOLN
15.0000 mg | Freq: Once | INTRAMUSCULAR | Status: AC
Start: 2022-11-24 — End: 2022-11-24
  Administered 2022-11-24: 15 mg via INTRAVENOUS
  Filled 2022-11-24: qty 1

## 2022-11-24 MED ORDER — ONDANSETRON HCL 4 MG PO TABS: 4.0000 mg | ORAL_TABLET | Freq: Four times a day (QID) | ORAL | 0 refills | Status: DC

## 2022-11-24 NOTE — ED Notes (Signed)
Pt to US.

## 2022-11-24 NOTE — Discharge Instructions (Addendum)
You were seen today for abdominal pain. We did not identify any emergent cause for your symptoms. Your evaluation is most consistent with nonspecific cramping versus possibly very early appendicitis.   Plan and next steps:   The following may be helpful in managing your symptoms:   Pain- Lidocaine Patches  Apply to affected area for up to 12 hours at a time.   Pain/Fever- Adult Tylenol dosing:  650 mg orally every 4 to 6 hours as needed, MAX: 3250 mg/24 hours   (Extra-strength) 1000 mg orally every 6 hours as needed; MAX: 3000 mg/24 hours   Do not use if you have liver disease. Read the label on the bottle.   Pain/Fever- Adult Ibuprofen Dosing  200 to 400 mg orally every 4 to 6 hours as needed; MAX 1200 mg/day; do not take longer than 10 days   Do not use if you have kidney disease. Read the label on the bottle   Nausea- You have been prescribed Zofran Take 4mg  by mouth as needed every 6 hours approximately 30 minutes before eating.  Findings:  You may see all of your lab and imaging results utilizing our online portal! Look in this document or ask a team member for your mychart* access information. The most notable results have additionally been verbally communicated with you and your bedside family.    Follow-up Plan:   Follow up with the patient's normal primary care provider for monitoring of this condition within 48 hours.   Signs/Symptoms that would warrant return to the ED:  Please return to the ED if you experience worsening of symptoms or any abrupt changes in your health. Standard of care precautions for your chief complaint have already been verbally communicated with you. Always be on alert for fevers, chills, shortness of breath, chest pains, or sudden changes that warrant immediate evaluation.    Thank you for allowing to be a part of you and your families' care.   [place] MD

## 2022-11-24 NOTE — ED Notes (Signed)
Back from [place], alert, NAD, calm, family at [facility].

## 2022-11-24 NOTE — ED Notes (Signed)
Alert, NAD, calm, interactive, EDP in to see

## 2022-11-24 NOTE — ED Notes (Signed)
Into room, steady gait, alert, NAD, calm, interactive.

## 2022-11-24 NOTE — ED Triage Notes (Signed)
Pt arrived complaining of sudden onset of pelvic pain that started this morning upon waking up.   6/10 and a constant cramping Denies any vaginal blood or discharge   Pt states that she is concerned that her IUD may not be in place

## 2022-11-24 NOTE — ED Provider Notes (Signed)
[facility] EMERGENCY DEPARTMENT Provider Note   CSN: [id number] Arrival date & time: 11/24/22  6568     History Chief Complaint  Patient presents with   Abdominal Pain    HPI Kim Mejia is a 21 y.o. female presenting for abdominal pain that onset this morning.  She states that she was woken up from sleep at 530 this morning with onset of bilateral lower abdominal pain.  She denies fevers or chills nausea vomiting, syncope shortness of breath.  She is otherwise ambulatory tolerating p.o. intake.  She states that he feels like when her IUD was placed. No known sick contacts.  Patient otherwise healthy on no medication at this time..   Patient's recorded medical, surgical, social, medication list and allergies were reviewed in the Snapshot window as part of the initial history.   Review of Systems   Review of Systems  Constitutional:  Negative for chills and fever.  HENT:  Negative for ear pain and sore throat.   Eyes:  Negative for pain and visual disturbance.  Respiratory:  Negative for cough and shortness of breath.   Cardiovascular:  Negative for chest pain and palpitations.  Gastrointestinal:  Positive for abdominal pain. Negative for vomiting.  Genitourinary:  Negative for dysuria and hematuria.  Musculoskeletal:  Negative for arthralgias and back pain.  Skin:  Negative for color change and rash.  Neurological:  Negative for seizures and syncope.  All other systems reviewed and are negative.   Physical Exam Updated Vital Signs BP 96/62   Pulse 64   Temp 97.6 F (36.4 C)   Resp 16   Ht 5\' 2"  (1.575 m)   Wt 108.9 kg   SpO2 98%   BMI 43.90 kg/m  Physical Exam Vitals and nursing note reviewed.  Constitutional:      General: She is not in acute distress.    Appearance: She is well-developed.  HENT:     Head: Normocephalic and atraumatic.  Eyes:     Conjunctiva/sclera: Conjunctivae normal.  Cardiovascular:     Rate and Rhythm: Normal rate  and regular rhythm.     Heart sounds: No murmur heard. Pulmonary:     Effort: Pulmonary effort is normal. No respiratory distress.     Breath sounds: Normal breath sounds.  Abdominal:     Palpations: Abdomen is soft.     Tenderness: There is abdominal tenderness in the right lower quadrant, suprapubic area and left lower quadrant. There is no right CVA tenderness, left CVA tenderness or guarding.  Musculoskeletal:        General: No swelling.     Cervical back: Neck supple.  Skin:    General: Skin is warm and dry.     Capillary Refill: Capillary refill takes less than 2 seconds.  Neurological:     Mental Status: She is alert.  Psychiatric:        Mood and Affect: Mood normal.      ED Course/ Medical Decision Making/ A&P    Procedures Procedures   Medications Ordered in ED Medications  ketorolac (TORADOL) 15 MG/ML injection 15 mg (has no administration in time range)  ondansetron (ZOFRAN) injection 4 mg (has no administration in time range)   Medical Decision Making:   Kim Mejia is a 21 y.o. female who presented to the ED today with abdominal pain, detailed above.    Complete initial physical exam performed, notably the patient  was HDS in NAD.  Mild lower abdominal tenderness without guarding  Reviewed and confirmed nursing documentation for past medical history, family history, social history.    Initial Assessment:   With the patient's presentation of abdominal pain, most likely diagnosis is musculoskeletal versus nonspecific etiology. Other diagnoses were considered including (but not limited to) gastroenteritis, colitis, small bowel obstruction, appendicitis, cholecystitis, pancreatitis, nephrolithiasis, UTI, pyleonephritis, ruptured ectopic pregnancy, PID, ovarian torsion. These are considered less likely due to history of present illness and physical exam findings.   This is most consistent with an acute life/limb threatening illness complicated by underlying  chronic conditions.   Initial Plan:  CBC/CMP to evaluate for underlying infectious/metabolic etiology for patient's abdominal pain  Lipase to evaluate for pancreatitis  Pelvic US to evaluate for torsion or other obstetric/gynecologic etiology of her symptoms Urinalysis and repeat physical assessment to evaluate for UTI/Pyelonpehritis  Empiric management of symptoms with escalating pain control and antiemetics as needed.   Initial Study Results:   Laboratory  All laboratory results reviewed without evidence of clinically relevant pathology.    Radiology All images reviewed independently. Agree with radiology report at this time.   US PELVIC COMPLETE WITH TRANSVAGINAL  Result Date: 11/24/2022 CLINICAL DATA:  Pelvic pain. EXAM: TRANSABDOMINAL AND TRANSVAGINAL ULTRASOUND OF PELVIS TECHNIQUE: Both transabdominal and transvaginal ultrasound examinations of the pelvis were performed. Transabdominal technique was performed for global imaging of the pelvis including uterus, ovaries, adnexal regions, and pelvic cul-de-sac. It was necessary to proceed with endovaginal exam following the transabdominal exam to visualize the uterus, endometrium, ovaries and adnexal regions. COMPARISON:  None Available. FINDINGS: Uterus Measurements: 8.7 x 5.0 x 6.6 cm = volume: 150 mL. No fibroids or other mass visualized. Endometrium Thickness: 6 mm, within normal limits. Intrauterine contraceptive device in appropriate position. Right ovary Measurements: 2.6 x 1.7 x 2.1 cm = volume: 5.0 mL. Normal appearance/no adnexal mass. Left ovary Measurements: 5.9 x 4.9 x 4.9 cm = volume: 74.5 mL. Normal appearance/no adnexal mass. Other findings Moderate free fluid. IMPRESSION: 1. Moderate free fluid. 2. Intrauterine contraceptive device in appropriate position. Electronically Signed   By: Leanna Battles M.D.   On: 11/24/2022 09:51     Final Reassessment and Plan:   I evaluated the patient after 4 and half hours in the emergency  department.  Her symptoms are grossly resolved.  She has some residual right lower quadrant pain that is intermittent in nature.  She denies any nausea and otherwise feels well.  I had a prolonged conversation with the patient regarding her current presentation, clinical overlap with alternative etiologies such as appendicitis.  She may be suffering from mild gastroenteritis, however there is diagnostic uncertainty at this time.  Offered immediate CT scan with the understanding that it may not be diagnostic at this time, however patient would prefer to elect for observation with plan to return to the emergency department should her symptoms decompensate in the outpatient setting.  She would like to trial anti-inflammatories and antinausea medicines and will plan to follow-up with PCP for reassessment.  If her symptoms become acutely worse again if she has right lower quadrant pain, she will return for cross-sectional imaging at that time.  I believe this is reasonable given her well appearance.  Disposition:  I have considered need for hospitalization, however, considering all of the above, I believe this patient is stable for discharge at this time.  Patient/family educated about specific return precautions for given chief complaint and symptoms.  Patient/family educated about follow-up with PCP.     Patient/family expressed understanding of return  precautions and need for follow-up. Patient spoken to regarding all imaging and laboratory results and appropriate follow up for these results. All education provided in verbal form with additional information in written form. Time was allowed for answering of patient questions. Patient discharged.    Emergency Department Medication Summary:   Medications  ketorolac (TORADOL) 15 MG/ML injection 15 mg (has no administration in time range)  ondansetron (ZOFRAN) injection 4 mg (has no administration in time range)            Clinical Impression:  1.  Generalized abdominal pain      Discharge   Final Clinical Impression(s) / ED Diagnoses Final diagnoses:  Generalized abdominal pain    Rx / DC Orders ED Discharge Orders          Ordered    ondansetron (ZOFRAN) 4 MG tablet  Every 6 hours        11/24/22 1045              Glyn Ade, MD 11/24/22 1046

## 2022-11-24 NOTE — ED Notes (Signed)
Pt aware we need a urine sample. Unable to provide at this time

## 2022-11-24 NOTE — ED Provider Triage Note (Signed)
Emergency Medicine Provider Triage Evaluation Note  My Rinke , a 21 y.o. female  was evaluated in triage.  Pt complains of pelvic pain.  She states she turned over quickly in bed this morning and feels like her IUD may have shifted.  She reports pain in her pelvic region.  Denies dysuria, urinary frequency, discharge, or bleeding.  Review of Systems  Positive: Pelvic pain, IUD check Negative: fever  Physical Exam  BP 104/72 (BP Location: Right Arm)   Pulse 74   Temp 97.6 F (36.4 C)   Resp 18   SpO2 97%  Gen:   Awake, no distress   Resp:  Normal effort  MSK:   Moves extremities without difficulty  Other:    Medical Decision Making  Medically screening exam initiated at 6:23 AM.  Appropriate orders placed.  Cecilia Ingber was informed that the remainder of the evaluation will be completed by another provider, this initial triage assessment does not replace that evaluation, and the importance of remaining in the ED until their evaluation is complete.  Pelvic pain, IUD check.  Will check UA, will need pelvic exam.   Garlon Hatchet, PA-C 11/24/22 [id number]

## 2022-11-24 NOTE — ED Notes (Signed)
Delay d/t critical influx

## 2022-11-24 NOTE — ED Notes (Signed)
Not in room

## 2023-01-02 ENCOUNTER — Other Ambulatory Visit: Payer: Self-pay

## 2023-01-02 ENCOUNTER — Emergency Department (HOSPITAL_BASED_OUTPATIENT_CLINIC_OR_DEPARTMENT_OTHER)
Admission: EM | Admit: 2023-01-02 | Discharge: 2023-01-02 | Disposition: A | Discharge status: Home / Self Care | Payer: Medicaid Other | Attending: Emergency Medicine | Admitting: Emergency Medicine

## 2023-01-02 ENCOUNTER — Encounter (HOSPITAL_BASED_OUTPATIENT_CLINIC_OR_DEPARTMENT_OTHER): Payer: Self-pay

## 2023-01-02 DIAGNOSIS — H9202 Otalgia, left ear: Secondary | ICD-10-CM | POA: Diagnosis not present

## 2023-01-02 MED ORDER — PSEUDOEPHEDRINE HCL ER 120 MG PO TB12: 120.0000 mg | ORAL_TABLET | Freq: Two times a day (BID) | ORAL | 0 refills | Status: DC

## 2023-01-02 NOTE — ED Triage Notes (Signed)
Pt c/o left ear "fullness". Pt states she feels like her left ear is full and pops when she opens/closes jaw. Pt denies pain.

## 2023-01-02 NOTE — ED Provider Notes (Signed)
[facility] EMERGENCY [facility] Emergency Department Provider Note MRN:  [id number]  Arrival date & time: 01/02/23     Chief Complaint   Ear Fullness   History of Present Illness   Kim Mejia is a 22 y.o. year-old female with no pertinent past medical history presenting to the ED with chief complaint of ear fullness.  Some discomfort in the ear over the past few days.  Getting over a viral illness and had some nasal congestion.  Feels like her ears are popping when she moves her jaw.  Review of Systems  A thorough review of systems was obtained and all systems are negative except as noted in the HPI and PMH.   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   Was evaluated by cardiology,  To have further testing, possible popliteal entrapment, consult with vascular surgery, or ortho if results neg    Closed fracture of distal fibula 03/10/2017   Right - broken when kicked during soccer game   Obesity    Pregnancy induced hypertension     Past Surgical History:  Procedure Laterality Date   ORIF FIBULA FRACTURE Right 03/14/2017   Procedure: RIGHT OPEN REDUCTION INTERNAL FIXATION LATERAL MALLEOLUS;  Surgeon: Eldred Manges, MD;  Location: [facility] OR;  Service: Orthopedics;  Laterality: Right;    Family History  Problem Relation Age of Onset   Hyperlipidemia Mother     Social History   Socioeconomic History   Marital status: Single    Spouse name: Not on file   Number of children: Not on file   Years of education: Not on file   Highest education level: Not on file  Occupational History   Occupation: unemployed  Tobacco Use   Smoking status: Never   Smokeless tobacco: Never   Tobacco comments:    father smokes, visits on weekends  Vaping Use   Vaping Use: Never used  Substance and Sexual Activity   Alcohol use: No    Alcohol/week: 0.0 standard drinks of alcohol   Drug use: No   Sexual activity: Yes     Partners: Male    Birth control/protection: I.U.D.  Other Topics Concern   Not on file  Social History Narrative   Not on file   Social Determinants of Health   Financial Resource Strain: Not on file  Food Insecurity: Not on file  Transportation Needs: Not on file  Physical Activity: Not on file  Stress: Not on file  Social Connections: Not on file  Intimate Partner Violence: Not on file     Physical Exam   Vitals:   01/02/23 0357  BP: 110/74  Pulse: 72  Resp: 16  Temp: 98.5 F (36.9 C)  SpO2: 96%    CONSTITUTIONAL: Well-appearing, NAD NEURO/PSYCH:  Alert and oriented x 3, no focal deficits EYES:  eyes equal and reactive ENT/NECK:  no LAD, no JVD CARDIO: Regular rate, well-perfused, normal S1 and S2 PULM:  CTAB no wheezing or rhonchi GI/GU:  non-distended, non-tender MSK/SPINE:  No gross deformities, no edema SKIN:  no rash, atraumatic   *Additional and/or pertinent findings included in MDM below  Diagnostic and Interventional Summary    EKG Interpretation  Date/Time:    Ventricular Rate:    PR Interval:    QRS Duration:   QT Interval:    QTC Calculation:   R Axis:     Text Interpretation:  Labs Reviewed - No data to display  No orders to display    Medications - No data to display   Procedures  /  Critical Care Procedures  ED Course and Medical Decision Making  Initial Impression and Ddx Well-appearing on exam, no tenderness to the mastoids, no pain with movement of the pinna.  The TMs appear normal bilaterally, no cerumen impaction.  Given the history and the recent nasal congestion, suspect eustachian tube dysfunction, appropriate for discharge on decongestions.  Past medical/surgical history that increases complexity of ED encounter: None  Interpretation of Diagnostics Laboratory and/or imaging options to aid in the diagnosis/care of the patient were considered.  After careful history and physical examination, it was determined  that there was no indication for diagnostics at this time.  Patient Reassessment and Ultimate Disposition/Management     Discharge  Patient management required discussion with the following services or consulting groups:  None  Complexity of Problems Addressed Acute complicated illness or Injury  Additional Data Reviewed and Analyzed Further history obtained from: Further history from spouse/family member  Additional Factors Impacting ED Encounter Risk Prescriptions  Barth Kirks. Sedonia Small, [place] [email or web address]  Final Clinical Impressions(s) / ED Diagnoses     ICD-10-CM   1. Discomfort of left ear  H92.02       ED Discharge Orders          Ordered    pseudoephedrine (SUDAFED 12 HOUR) 120 MG 12 hr tablet  2 times daily        01/02/23 0429             Discharge Instructions Discussed with and Provided to Patient:    Discharge Instructions      You were evaluated in the Emergency Department and after careful evaluation, we did not find any emergent condition requiring admission or further testing in the hospital.  Your exam/testing today is overall reassuring.  Suspect symptoms are due to eustachian tube dysfunction.  You did not have any signs of an ear infection today.  Recommend taking the Sudafed medication twice daily as needed for discomfort.  Would also recommend Tylenol or Motrin for pain.  Please return to the Emergency Department if you experience any worsening of your condition.   Thank you for allowing [place] to be a part of your care.      Maudie Flakes, MD 01/02/23 [phone]

## 2023-01-02 NOTE — Discharge Instructions (Signed)
You were evaluated in the Emergency Department and after careful evaluation, we did not find any emergent condition requiring admission or further testing in the hospital.  Your exam/testing today is overall reassuring.  Suspect symptoms are due to eustachian tube dysfunction.  You did not have any signs of an ear infection today.  Recommend taking the Sudafed medication twice daily as needed for discomfort.  Would also recommend Tylenol or Motrin for pain.  Please return to the Emergency Department if you experience any worsening of your condition.   Thank you for allowing [place] to be a part of your care.

## 2023-06-10 ENCOUNTER — Ambulatory Visit (HOSPITAL_COMMUNITY)
Admission: EM | Admit: 2023-06-10 | Discharge: 2023-06-10 | Disposition: A | Discharge status: Home / Self Care | Payer: Medicaid Other | Attending: Physician Assistant | Admitting: Physician Assistant

## 2023-06-10 ENCOUNTER — Encounter (HOSPITAL_COMMUNITY): Payer: Self-pay

## 2023-06-10 DIAGNOSIS — M654 Radial styloid tenosynovitis [de Quervain]: Secondary | ICD-10-CM | POA: Diagnosis not present

## 2023-06-10 MED ORDER — IBUPROFEN 800 MG PO TABS: 800.0000 mg | ORAL_TABLET | Freq: Three times a day (TID) | ORAL | 0 refills | Status: DC

## 2023-06-10 NOTE — Discharge Instructions (Signed)
I am concerned that you have inflamed the soft tissue in your wrist.  Please use the brace for comfort and support.  Take ibuprofen for pain.  Do not take NSAIDs with this medication including aspirin, ibuprofen/Advil, naproxen/Aleve.  You can use acetaminophen/Tylenol for breakthrough pain.  Keep it elevated and use ice a few times per day.  If your symptoms or not improving please follow-up with sports medicine; call to schedule an appointment.  If you have any worsening symptoms including increasing pain, numbness, tingling, weakness in the hand you should be seen immediately.

## 2023-06-10 NOTE — ED Provider Notes (Signed)
[facility]    CSN: [id number] Arrival date & time: 06/10/23  9147      History   Chief Complaint Chief Complaint  Patient presents with   Wrist Pain    HPI Kim Mejia is a 22 y.o. female.   Patient presents today with a 1 week history of right wrist and thumb pain.  She denies any known injury increase activity prior to symptom onset but does report that she is often lifting up her young children and fats this exacerbates his symptoms.  She denies previous injury or surgery involving her right wrist or hand.  She is left-handed.  Denies any associated numbness or paresthesias.  She has tried over-the-counter medications with minimal improvement of symptoms.  She reports that pain is worse with certain movements of her thumb and travels into her wrist/forearm.  At rest pain is rated 2 on a 0-10 pain scale but increases to 6 with palpation or manipulation, no alleviating factors identified.  She is confident that she is not pregnant.  She is not breast-feeding.    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   Supervision of other normal pregnancy, antepartum 10/02/2019   BMI 40.0-44.9, adult (HCC) 03/21/2015    Past Surgical History:  Procedure Laterality Date   ORIF FIBULA FRACTURE Right 03/14/2017   Procedure: RIGHT OPEN REDUCTION INTERNAL FIXATION LATERAL MALLEOLUS;  Surgeon: Eldred Manges, MD;  Location: [facility];  Service:  Orthopedics;  Laterality: Right;    OB History     Gravida  2   Para  2   Term  2   Preterm      AB      Living  2      SAB      IAB      Ectopic      Multiple  0   Live Births  2            Home Medications    Prior to Admission medications   Medication Sig Start Date End Date Taking? Authorizing Provider  ibuprofen (ADVIL) 800 MG tablet Take 1 tablet (800 mg total) by mouth 3 (three) times daily. 06/10/23  Yes Riniyah Speich, Noberto Retort, PA-C  acetaminophen (TYLENOL) 325 MG tablet Take 2 tablets (650 mg total) by mouth every 4 (four) hours as needed (for pain scale < 4). Patient not taking: Reported on 05/11/2022 02/27/22   Levin Erp, MD  ondansetron (ZOFRAN) 4 MG tablet Take 1 tablet (4 mg total) by mouth every 6 (six) hours. 11/24/22   Glyn Ade, MD  Prenatal Vit-Fe Fumarate-FA (MULTIVITAMIN-PRENATAL) 27-0.8 MG TABS tablet Take 1 tablet by mouth daily at 12 noon. Patient not taking: Reported on 04/13/2022    [provider]  pseudoephedrine (SUDAFED 12 HOUR) 120 MG 12 hr tablet Take 1 tablet (120 mg total) by mouth 2 (two) times daily. 01/02/23   Sabas Sous, MD  ferrous sulfate 325 (  65 FE) MG tablet Take 1 tablet (325 mg total) by mouth daily with breakfast. Patient not taking: Reported on 05/27/2020 05/14/20 06/06/20  Meccariello, Solmon Ice, MD    Family History Family History  Problem Relation Age of Onset   Hyperlipidemia Mother     Social History Social History   Tobacco Use   Smoking status: Never   Smokeless tobacco: Never   Tobacco comments:    father smokes, visits on weekends  Vaping Use   Vaping Use: Never used  Substance Use Topics   Alcohol use: No    Alcohol/week: 0.0 standard drinks of alcohol   Drug use: No     Allergies   Patient has no known allergies.   Review of Systems Review of Systems  Constitutional:  Positive for activity change. Negative for appetite change, fatigue and fever.  Musculoskeletal:  Positive  for arthralgias. Negative for myalgias.  Skin:  Negative for color change and wound.  Neurological:  Negative for weakness and numbness.     Physical Exam Triage Vital Signs ED Triage Vitals  Enc Vitals Group     BP 06/10/23 1027 106/76     Pulse Rate 06/10/23 1027 68     Resp 06/10/23 1027 20     Temp 06/10/23 1027 98 F (36.7 C)     Temp src --      SpO2 06/10/23 1027 99 %     Weight --      Height --      Head Circumference --      Peak Flow --      Pain Score 06/10/23 1026 6     Pain Loc --      Pain Edu? --      Excl. in GC? --    No data found.  Updated Vital Signs BP 106/76   Pulse 68   Temp 98 F (36.7 C)   Resp 20   LMP 05/12/2023 (Approximate)   SpO2 99%   Visual Acuity Right Eye Distance:   Left Eye Distance:   Bilateral Distance:    Right Eye Near:   Left Eye Near:    Bilateral Near:     Physical Exam Vitals reviewed.  Constitutional:      General: She is awake. She is not in acute distress.    Appearance: Normal appearance. She is well-developed. She is not ill-appearing.     Comments: Very pleasant female appears stated age in no acute distress sitting comfortably in exam room  HENT:     Head: Normocephalic and atraumatic.  Cardiovascular:     Rate and Rhythm: Normal rate and regular rhythm.     Pulses:          Radial pulses are 2+ on the right side and 2+ on the left side.     Heart sounds: Normal heart sounds, S1 normal and S2 normal. No murmur heard.    Comments: Capillary fill within 2 seconds fingers Pulmonary:     Effort: Pulmonary effort is normal.     Breath sounds: Normal breath sounds. No wheezing, rhonchi or rales.     Comments: Clear to auscultation bilaterally Abdominal:     Palpations: Abdomen is soft.     Tenderness: There is no abdominal tenderness.  Musculoskeletal:     Right wrist: Tenderness present. No swelling, bony tenderness or snuff box tenderness. Normal range of motion.     Right hand: No swelling or bony  tenderness. Normal sensation. There is no  disruption of two-point discrimination. Normal capillary refill.     Comments: Right wrist/hand: Tenderness palpation over radial wrist without focal bony tenderness.  No snuffbox tenderness..  Hand neurovascularly intact.  Normal pincer and grip strength.  Normal 2 point discrimination.  Positive Gwenlyn Fudge.  Psychiatric:        Behavior: Behavior is cooperative.      UC Treatments / Results  Labs (all labs ordered are listed, but only abnormal results are displayed) Labs Reviewed - No data to display  EKG   Radiology No results found.  Procedures Procedures (including critical care time)  Medications Ordered in UC Medications - No data to display  Initial Impression / Assessment and Plan / UC Course  I have reviewed the triage vital signs and the nursing notes.  Pertinent labs & imaging results that were available during my care of the patient were reviewed by me and considered in my medical decision making (see chart for details).     Patient is well-appearing, afebrile, nontoxic, nontachycardic.  Vital signs and physical exam are reassuring.  Plain films were deferred as she denies any recent trauma and has no focal bony tenderness on exam.  Concern for de Quervain's tenosynovitis given clinical presentation.  She was placed in a brace for comfort and support.  Recommend she avoid strenuous activity including heavy lifting.  She is to use RICE protocol for symptom relief as well as prescription ibuprofen which was sent to the pharmacy.  Discussed that she is not to take NSAIDs with ibuprofen due to risk of GI bleeding but can use acetaminophen/Tylenol for breakthrough pain.  Discussed that if her symptoms or not improving she should follow-up with sports medicine was given contact information for local provider with instruction to call to schedule an appointment.  If she has any worsening symptoms she needs to be seen immediately including  increasing pain, numbness, paresthesias, weakness.  Strict return precautions given.  Final Clinical Impressions(s) / UC Diagnoses   Final diagnoses:  De Quervain's tenosynovitis, right     Discharge Instructions      I am concerned that you have inflamed the soft tissue in your wrist.  Please use the brace for comfort and support.  Take ibuprofen for pain.  Do not take NSAIDs with this medication including aspirin, ibuprofen/Advil, naproxen/Aleve.  You can use acetaminophen/Tylenol for breakthrough pain.  Keep it elevated and use ice a few times per day.  If your symptoms or not improving please follow-up with sports medicine; call to schedule an appointment.  If you have any worsening symptoms including increasing pain, numbness, tingling, weakness in the hand you should be seen immediately.    ED Prescriptions     Medication Sig Dispense Auth. Provider   ibuprofen (ADVIL) 800 MG tablet Take 1 tablet (800 mg total) by mouth 3 (three) times daily. 21 tablet Aubriauna Riner, Noberto Retort, PA-C      PDMP not reviewed this encounter.   Jeani Hawking, PA-C 06/10/23 1049

## 2023-06-10 NOTE — ED Triage Notes (Signed)
Pt presents to [facility] with co of right wrist pain/ thumb pain for one week. Pt reports no injury but she thinks she may have hurt it picking up her children. Pt has been using motrin.

## 2023-07-06 ENCOUNTER — Emergency Department (HOSPITAL_BASED_OUTPATIENT_CLINIC_OR_DEPARTMENT_OTHER): Payer: Medicaid Other | Admitting: Radiology

## 2023-07-06 ENCOUNTER — Emergency Department (HOSPITAL_BASED_OUTPATIENT_CLINIC_OR_DEPARTMENT_OTHER): Payer: Medicaid Other

## 2023-07-06 ENCOUNTER — Other Ambulatory Visit: Payer: Self-pay

## 2023-07-06 ENCOUNTER — Emergency Department (HOSPITAL_BASED_OUTPATIENT_CLINIC_OR_DEPARTMENT_OTHER)
Admission: EM | Admit: 2023-07-06 | Discharge: 2023-07-06 | Disposition: A | Discharge status: Home / Self Care | Payer: Medicaid Other | Attending: Emergency Medicine | Admitting: Emergency Medicine

## 2023-07-06 ENCOUNTER — Encounter (HOSPITAL_BASED_OUTPATIENT_CLINIC_OR_DEPARTMENT_OTHER): Payer: Self-pay | Admitting: Emergency Medicine

## 2023-07-06 DIAGNOSIS — M25571 Pain in right ankle and joints of right foot: Secondary | ICD-10-CM | POA: Diagnosis not present

## 2023-07-06 DIAGNOSIS — M79671 Pain in right foot: Secondary | ICD-10-CM | POA: Diagnosis not present

## 2023-07-06 DIAGNOSIS — M7989 Other specified soft tissue disorders: Secondary | ICD-10-CM | POA: Diagnosis not present

## 2023-07-06 DIAGNOSIS — M25471 Effusion, right ankle: Secondary | ICD-10-CM | POA: Diagnosis not present

## 2023-07-06 NOTE — ED Triage Notes (Addendum)
Patient arrives ambulatory by POV states she had an ankle surgery year ago and is now having pain at that sight. Denies any new injury. Reports a knot to top of the incision site.

## 2023-07-06 NOTE — Discharge Instructions (Signed)
Contact a health care provider if: You have a fever. The swelling or discoloration gets worse. You develop more hematomas. Your pain is worse or your pain is not controlled with medicine. Your skin over the hematoma breaks or starts bleeding. Get help right away if: Your hematoma is in your chest or abdomen and you have weakness, shortness of breath, or a change in consciousness. You have a hematoma on your scalp that is caused by a fall or injury, and you also have: A headache that gets worse. Trouble speaking or understanding speech. Weakness. A change in alertness or consciousness. These symptoms may be an emergency. Get help right away. Call 911. Do not wait to see if the symptoms will go away. Do not drive yourself to the hospital. 

## 2023-07-07 NOTE — ED Provider Notes (Signed)
[facility] Provider Note   CSN: [id number] Arrival date & time: 07/06/23  1343     History  Chief Complaint  Patient presents with   Ankle Pain    Kim Mejia is a 22 y.o. female w no sig pmh who c/o spontaneous swelling of the R ankle.  Previous ORIF. Pt reports 3 days of focal swelling on the lateral side. No known injury. No pain except to palpation. Denies cp/sob. No OCPs.  Ankle Pain      Home Medications Prior to Admission medications   Medication Sig Start Date End Date Taking? Authorizing Provider  acetaminophen (TYLENOL) 325 MG tablet Take 2 tablets (650 mg total) by mouth every 4 (four) hours as needed (for pain scale < 4). Patient not taking: Reported on 05/11/2022 02/27/22   Levin Erp, MD  ibuprofen (ADVIL) 800 MG tablet Take 1 tablet (800 mg total) by mouth 3 (three) times daily. 06/10/23   Raspet, Erin K, PA-C  ondansetron (ZOFRAN) 4 MG tablet Take 1 tablet (4 mg total) by mouth every 6 (six) hours. 11/24/22   Glyn Ade, MD  Prenatal Vit-Fe Fumarate-FA (MULTIVITAMIN-PRENATAL) 27-0.8 MG TABS tablet Take 1 tablet by mouth daily at 12 noon. Patient not taking: Reported on 04/13/2022    [provider]  pseudoephedrine (SUDAFED 12 HOUR) 120 MG 12 hr tablet Take 1 tablet (120 mg total) by mouth 2 (two) times daily. 01/02/23   Sabas Sous, MD  ferrous sulfate 325 (65 FE) MG tablet Take 1 tablet (325 mg total) by mouth daily with breakfast. Patient not taking: Reported on 05/27/2020 05/14/20 06/06/20  Meccariello, Solmon Ice, MD      Allergies    Patient has no known allergies.    Review of Systems   Review of Systems  Physical Exam Updated Vital Signs BP 112/78 (BP Location: Right Arm)   Pulse 74   Temp 98.7 F (37.1 C) (Oral)   Resp 16   Ht 5\' 2"  (1.575 m)   Wt 117.9 kg   LMP 05/12/2023 (Approximate)   SpO2 100%   BMI 47.55 kg/m  Physical Exam Vitals and nursing note reviewed.   Constitutional:      General: She is not in acute distress.    Appearance: She is well-developed. She is not diaphoretic.  HENT:     Head: Normocephalic and atraumatic.     Right Ear: External ear normal.     Left Ear: External ear normal.     Nose: Nose normal.     Mouth/Throat:     Mouth: Mucous membranes are moist.  Eyes:     General: No scleral icterus.    Conjunctiva/sclera: Conjunctivae normal.  Cardiovascular:     Rate and Rhythm: Normal rate and regular rhythm.     Heart sounds: Normal heart sounds. No murmur heard.    No friction rub. No gallop.  Pulmonary:     Effort: Pulmonary effort is normal. No respiratory distress.     Breath sounds: Normal breath sounds.  Abdominal:     General: Bowel sounds are normal. There is no distension.     Palpations: Abdomen is soft. There is no mass.     Tenderness: There is no abdominal tenderness. There is no guarding.  Musculoskeletal:     Cervical back: Normal range of motion.     Comments: R ankle w/ well-healed lateral surgical scar, well circumscribed area of tenderness and swelling. it is not warm or erythematous. Mild  fluctuance. No induration. Full ROM and strength, no pain with movement  Skin:    General: Skin is warm and dry.  Neurological:     Mental Status: She is alert and oriented to person, place, and time.  Psychiatric:        Behavior: Behavior normal.     ED Results / Procedures / Treatments   Labs (all labs ordered are listed, but only abnormal results are displayed) Labs Reviewed - No data to display  EKG None  Radiology US Venous Img Lower Unilateral Right  Result Date: 07/06/2023 CLINICAL DATA:  Right lower extremity swelling. EXAM: Right LOWER EXTREMITY VENOUS DOPPLER ULTRASOUND TECHNIQUE: Gray-scale sonography with compression, as well as color and duplex ultrasound, were performed to evaluate the deep venous system(s) from the level of the common femoral vein through the popliteal and proximal calf  veins. COMPARISON:  None Available. FINDINGS: VENOUS Normal compressibility of the common femoral, superficial femoral, and popliteal veins, as well as the visualized calf veins. Visualized portions of profunda femoral vein and great saphenous vein unremarkable. No filling defects to suggest DVT on grayscale or color Doppler imaging. Doppler waveforms show normal direction of venous flow, normal respiratory plasticity and response to augmentation. Limited views of the contralateral common femoral vein are unremarkable. OTHER There is a 1.8 x 0.6 x 1.3 cm hypoechoic area deep to the surgical scar in the right lateral ankle. There is some flow in the periphery of this hypoechoic area. This may represent postsurgical granulation or scar formation. Other etiologies are not excluded. Correlation with clinical exam recommended. MRI may provide better evaluation if clinically indicated. No drainable fluid collection. Limitations: none IMPRESSION: 1. No evidence of DVT in the right lower extremity. 2. Hypoechoic area in the lateral ankle deep to the surgical scar may represent granulation tissue. Clinical correlation is recommended. Electronically Signed   By: Elgie Collard M.D.   On: 07/06/2023 18:33   DG Foot Complete Right  Result Date: 07/06/2023 CLINICAL DATA:  Pain after fall EXAM: RIGHT ANKLE - COMPLETE 3 VIEW; RIGHT FOOT COMPLETE - 3 VIEW COMPARISON:  None Available. FINDINGS: There is no evidence of fracture, dislocation, or joint effusion. Intact plate and screw fixation hardware of the distal fibula. There is no evidence of arthropathy or other focal bone abnormality. Soft tissues are unremarkable. IMPRESSION: Negative. Electronically Signed   By: Jacob Moores M.D.   On: 07/06/2023 14:56   DG Ankle Complete Right  Result Date: 07/06/2023 CLINICAL DATA:  Pain after fall EXAM: RIGHT ANKLE - COMPLETE 3 VIEW; RIGHT FOOT COMPLETE - 3 VIEW COMPARISON:  None Available. FINDINGS: There is no evidence of  fracture, dislocation, or joint effusion. Intact plate and screw fixation hardware of the distal fibula. There is no evidence of arthropathy or other focal bone abnormality. Soft tissues are unremarkable. IMPRESSION: Negative. Electronically Signed   By: Jacob Moores M.D.   On: 07/06/2023 14:56    Procedures Procedures    Medications Ordered in ED Medications - No data to display  ED Course/ Medical Decision Making/ A&P                             Medical Decision Making Patient with spontaneous swelling. Ddx hematoma, dvt. Dougbt abscess or tumor. I personally visualized and interpreted the images using our PACS system. Acute findings include:  Foot and ankle xray negative, Dvt study negative. Suspect hematoma - conservativetx and pcp f/u if sxs  worsen   Amount and/or Complexity of Data Reviewed Radiology: ordered and independent interpretation performed.           Final Clinical Impression(s) / ED Diagnoses Final diagnoses:  Right ankle swelling    Rx / DC Orders ED Discharge Orders     None         Arthor Captain, PA-C 07/07/23 1154    Arby Barrette, MD 07/07/23 1933

## 2023-07-08 ENCOUNTER — Ambulatory Visit: Payer: Medicaid Other | Admitting: Family Medicine

## 2023-09-29 ENCOUNTER — Ambulatory Visit: Payer: Medicaid Other | Admitting: Family Medicine

## 2023-09-29 ENCOUNTER — Encounter: Payer: Self-pay | Admitting: Family Medicine

## 2023-09-29 DIAGNOSIS — K219 Gastro-esophageal reflux disease without esophagitis: Secondary | ICD-10-CM | POA: Diagnosis not present

## 2023-09-29 DIAGNOSIS — Z0000 Encounter for general adult medical examination without abnormal findings: Secondary | ICD-10-CM

## 2023-09-29 DIAGNOSIS — Z0001 Encounter for general adult medical examination with abnormal findings: Secondary | ICD-10-CM

## 2023-09-29 DIAGNOSIS — Z7689 Persons encountering health services in other specified circumstances: Secondary | ICD-10-CM

## 2023-09-29 DIAGNOSIS — Z6841 Body Mass Index (BMI) 40.0 and over, adult: Secondary | ICD-10-CM | POA: Diagnosis not present

## 2023-09-29 LAB — LIPID PANEL
Cholesterol: 193 mg/dL (ref 0–200)
HDL: 45.5 mg/dL (ref >39.00)
LDL Cholesterol: 114 mg/dL — ABNORMAL HIGH (ref 0–99)
NonHDL: 147.14
Total CHOL/HDL Ratio: 4
Triglycerides: 165 mg/dL — ABNORMAL HIGH (ref 0.0–149.0)
VLDL: 33 mg/dL (ref 0.0–40.0)

## 2023-09-29 LAB — COMPREHENSIVE METABOLIC PANEL
ALT: 17 U/L (ref 0–35)
AST: 16 U/L (ref 0–37)
Albumin: 4.2 g/dL (ref 3.5–5.2)
Alkaline Phosphatase: 102 U/L (ref 39–117)
BUN: 8 mg/dL (ref 6–23)
CO2: 27 meq/L (ref 19–32)
Calcium: 9.1 mg/dL (ref 8.4–10.5)
Chloride: 103 meq/L (ref 96–112)
Creatinine, Ser: 0.64 mg/dL (ref 0.40–1.20)
GFR: 124.18 mL/min (ref >60.00)
Glucose, Bld: 103 mg/dL — ABNORMAL HIGH (ref 70–99)
Potassium: 3.9 meq/L (ref 3.5–5.1)
Sodium: 138 meq/L (ref 135–145)
Total Bilirubin: 0.4 mg/dL (ref 0.2–1.2)
Total Protein: 7.2 g/dL (ref 6.0–8.3)

## 2023-09-29 LAB — CBC WITH DIFFERENTIAL/PLATELET
Basophils Absolute: 0 10*3/uL (ref 0.0–0.1)
Basophils Relative: 0.6 % (ref 0.0–3.0)
Eosinophils Absolute: 0.1 10*3/uL (ref 0.0–0.7)
Eosinophils Relative: 2.3 % (ref 0.0–5.0)
HCT: 40.9 % (ref 36.0–46.0)
Hemoglobin: 13.5 g/dL (ref 12.0–15.0)
Lymphocytes Relative: 27.4 % (ref 12.0–46.0)
Lymphs Abs: 1.7 10*3/uL (ref 0.7–4.0)
MCHC: 33 g/dL (ref 30.0–36.0)
MCV: 91.7 fL (ref 78.0–100.0)
Monocytes Absolute: 0.5 10*3/uL (ref 0.1–1.0)
Monocytes Relative: 7.9 % (ref 3.0–12.0)
Neutro Abs: 3.9 10*3/uL (ref 1.4–7.7)
Neutrophils Relative %: 61.8 % (ref 43.0–77.0)
Platelets: 305 10*3/uL (ref 150.0–400.0)
RBC: 4.45 Mil/uL (ref 3.87–5.11)
RDW: 12.6 % (ref 11.5–15.5)
WBC: 6.3 10*3/uL (ref 4.0–10.5)

## 2023-09-29 LAB — T4, FREE: Free T4: 0.76 ng/dL (ref 0.60–1.60)

## 2023-09-29 LAB — TSH: TSH: 0.8 u[IU]/mL (ref 0.35–5.50)

## 2023-09-29 LAB — HEMOGLOBIN A1C: Hgb A1c MFr Bld: 5.1 % (ref 4.6–6.5)

## 2023-09-29 NOTE — Progress Notes (Signed)
New Patient Office Visit  Subjective    Patient ID: Kim Mejia, female    DOB: 25-Jan-2001  Age: 22 y.o. MRN: [id number]  CC:  Chief Complaint  Patient presents with   Establish Care    No concerns, no PCP    HPI [name] presents to establish care and for a fasting CPE.  OB/GYN- last year  Has IUD  Takes Tums for acid reflux.   Concerned about weight.  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 Is not breastfeeding.  Ages 1 and 1  Outpatient Encounter Medications as of 09/29/2023  Medication Sig   [DISCONTINUED] acetaminophen (TYLENOL) 325 MG tablet Take 2 tablets (650 mg total) by mouth every 4 (four) hours as needed (for pain scale < 4). (Patient not taking: Reported on 05/11/2022)   [DISCONTINUED] ferrous sulfate 325 (65 FE) MG tablet Take 1 tablet (325 mg total) by mouth daily with breakfast. (Patient not taking: Reported on 05/27/2020)   [DISCONTINUED] ibuprofen (ADVIL) 800 MG tablet Take 1 tablet (800 mg total) by mouth 3 (three) times daily.   [DISCONTINUED] ondansetron (ZOFRAN) 4 MG tablet Take 1 tablet (4 mg total) by mouth every 6 (six) hours.   [DISCONTINUED] Prenatal Vit-Fe Fumarate-FA (MULTIVITAMIN-PRENATAL) 27-0.8 MG TABS tablet Take 1 tablet by mouth daily at 12 noon. (Patient not taking: Reported on 04/13/2022)   [DISCONTINUED] pseudoephedrine (SUDAFED 12 HOUR) 120 MG 12 hr tablet Take 1 tablet (120 mg total) by mouth 2 (two) times daily.   No facility-administered encounter medications on file as of 09/29/2023.    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   Was evaluated by cardiology,  To have further testing, possible popliteal entrapment, consult with vascular surgery, or ortho if results neg    Closed fracture of distal fibula 03/10/2017   Right - broken when kicked during soccer game   Obesity    Pregnancy induced hypertension     Past  Surgical History:  Procedure Laterality Date   FRACTURE SURGERY  2018   ORIF FIBULA FRACTURE Right 03/14/2017   Procedure: RIGHT OPEN REDUCTION INTERNAL FIXATION LATERAL MALLEOLUS;  Surgeon: Eldred Manges, MD;  Location: [facility];  Service: Orthopedics;  Laterality: Right;    Family History  Problem Relation Age of Onset   Hyperlipidemia Mother     Social History   Socioeconomic History   Marital status: Single    Spouse name: Not on file   Number of children: Not on file   Years of education: Not on file   Highest education level: Not on file  Occupational History   Occupation: unemployed  Tobacco Use   Smoking status: Never   Smokeless tobacco: Never   Tobacco comments:    father smokes, visits on weekends  Vaping Use   Vaping status: Never Used  Substance and Sexual Activity   Alcohol use: No   Drug use: No   Sexual activity: Yes    Partners: Male    Birth control/protection: I.U.D.  Other Topics Concern   Not on file  Social History Narrative   Not on file   Social Determinants of Health   Financial Resource Strain: Not on file  Food Insecurity: Not on file  Transportation Needs: Not on file  Physical Activity: Not on file  Stress: Not on file  Social Connections: Not on file  Intimate Partner  Violence: Not on file    Review of Systems  Constitutional:  Negative for chills, fever, malaise/fatigue and weight loss.  HENT:  Negative for congestion, ear pain, sinus pain and sore throat.   Eyes:  Negative for blurred vision, double vision and pain.  Respiratory:  Negative for cough, shortness of breath and wheezing.   Cardiovascular:  Negative for chest pain, palpitations and leg swelling.  Gastrointestinal:  Positive for heartburn and nausea. Negative for abdominal pain, constipation, diarrhea and vomiting.  Genitourinary:  Negative for dysuria, frequency and urgency.  Musculoskeletal:  Negative for back pain, joint pain and myalgias.  Skin:  Negative for rash.   Neurological:  Negative for dizziness, tingling, focal weakness and headaches.  Psychiatric/Behavioral:  Negative for depression and suicidal ideas. The patient is not nervous/anxious.         Objective    BP 118/82 (BP Location: Left Arm, Patient Position: Sitting, Cuff Size: Large)   Pulse 62   Temp 97.6 F (36.4 C) (Temporal)   Ht 5\' 2"  (1.575 m)   Wt 242 lb (109.8 kg)   SpO2 98%   BMI 44.26 kg/m   Physical Exam Constitutional:      General: She is not in acute distress.    Appearance: She is obese. She is not ill-appearing.  HENT:     Right Ear: Tympanic membrane, ear canal and external ear normal.     Left Ear: Tympanic membrane, ear canal and external ear normal.     Nose: Nose normal.     Mouth/Throat:     Mouth: Mucous membranes are moist.     Pharynx: Oropharynx is clear.  Eyes:     Extraocular Movements: Extraocular movements intact.     Conjunctiva/sclera: Conjunctivae normal.     Pupils: Pupils are equal, round, and reactive to light.  Neck:     Thyroid: No thyroid mass, thyromegaly or thyroid tenderness.     Comments: Enlarged thyroid gland, non tender, no palpable masses  Cardiovascular:     Rate and Rhythm: Normal rate and regular rhythm.     Pulses: Normal pulses.     Heart sounds: Normal heart sounds.  Pulmonary:     Effort: Pulmonary effort is normal.     Breath sounds: Normal breath sounds.  Abdominal:     General: Bowel sounds are normal.     Palpations: Abdomen is soft.     Tenderness: There is no abdominal tenderness. There is no right CVA tenderness, left CVA tenderness, guarding or rebound.  Musculoskeletal:        General: Normal range of motion.     Cervical back: Normal range of motion and neck supple. No tenderness.     Right lower leg: No edema.     Left lower leg: No edema.  Lymphadenopathy:     Cervical: No cervical adenopathy.  Skin:    General: Skin is warm and dry.     Findings: No lesion or rash.  Neurological:      General: No focal deficit present.     Mental Status: She is alert and oriented to person, place, and time.     Cranial Nerves: No cranial nerve deficit.     Sensory: No sensory deficit.     Motor: No weakness.     Gait: Gait normal.  Psychiatric:        Mood and Affect: Mood normal.        Behavior: Behavior normal.  Thought Content: Thought content normal.         Assessment & Plan:   Problem List Items Addressed This Visit   None Visit Diagnoses     Severe obesity (BMI >= 40) (HCC)    -  Primary   Relevant Orders   CBC with Differential/Platelet   Comprehensive metabolic panel   Hemoglobin A1c   Lipid panel   TSH   T4, free   Encounter to establish care       Gastroesophageal reflux disease, unspecified whether esophagitis present       Encounter for general adult medical examination with abnormal findings          Preventive health care reviewed. Sees OB/GYN. Has IUD.  Counseling on healthy lifestyle including diet and exercise.  Recommend regular dental and eye exams.  Immunizations reviewed.  Discussed safety. Counseling on lifestyle modifications for GERD. Handout provided.  Discussed weight loss. Consider [phone] if covered. Consider referral to [facility].    Return for pending labs.   Hetty Blend, NP-C

## 2023-09-29 NOTE — Patient Instructions (Signed)

## 2023-10-05 ENCOUNTER — Ambulatory Visit: Payer: Medicaid Other | Admitting: Family Medicine

## 2023-10-05 ENCOUNTER — Encounter: Payer: Self-pay | Admitting: Family Medicine

## 2023-10-05 ENCOUNTER — Other Ambulatory Visit: Payer: Self-pay

## 2023-10-05 DIAGNOSIS — Z7689 Persons encountering health services in other specified circumstances: Secondary | ICD-10-CM

## 2023-10-05 MED ORDER — WEGOVY 0.5 MG/0.5ML ~~LOC~~ SOAJ
0.5000 mg | SUBCUTANEOUS | 2 refills | Status: DC
  Filled 2023-10-05 – 2023-10-13 (×3): qty 2, 28d supply, fill #0
  Filled 2023-11-06: qty 2, 28d supply, fill #1
  Filled 2023-12-04: qty 2, 28d supply, fill #2

## 2023-10-05 NOTE — Patient Instructions (Signed)
Stay hydrated, eat small frequent meals, and let us know if you have any concerns.

## 2023-10-05 NOTE — Progress Notes (Signed)
Subjective:     Patient ID: Kim Mejia, female    DOB: 09/06/2001, 22 y.o.   MRN: [id number]  Chief Complaint  Patient presents with   Obesity    Discuss weight loss injections    HPI   History of Present Illness         Here to discuss Wegovy for weight loss.   She has an IUD and is not breastfeeding.   Denies hx of pancreatitis. No personal or family hx of thyroid cancer.   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   Was evaluated by cardiology,  To have further testing, possible popliteal entrapment, consult with vascular surgery, or ortho if results neg    Closed fracture of distal fibula 03/10/2017   Right - broken when kicked during soccer game   Obesity    Pregnancy induced hypertension     Past Surgical History:  Procedure Laterality Date   FRACTURE SURGERY  2018   ORIF FIBULA FRACTURE Right 03/14/2017   Procedure: RIGHT OPEN REDUCTION INTERNAL FIXATION LATERAL MALLEOLUS;  Surgeon: Eldred Manges, MD;  Location: [facility] OR;  Service: Orthopedics;  Laterality: Right;    Family History  Problem Relation Age of Onset   Hyperlipidemia Mother     Social History   Socioeconomic History   Marital status: Single    Spouse name: Not on file   Number of children: Not on file   Years of education: Not on file   Highest education level: Not on file  Occupational History   Occupation: unemployed  Tobacco Use   Smoking status: Never   Smokeless tobacco: Never   Tobacco comments:    father smokes, visits on weekends  Vaping Use   Vaping status: Never Used  Substance and Sexual Activity   Alcohol use: No   Drug use: No   Sexual activity: Yes    Partners: Male    Birth control/protection: I.U.D.  Other Topics Concern   Not on file  Social History Narrative   Not on file   Social  Determinants of Health   Financial Resource Strain: Not on file  Food Insecurity: Not on file  Transportation Needs: Not on file  Physical Activity: Not on file  Stress: Not on file  Social Connections: Not on file  Intimate Partner Violence: Not on file    No outpatient medications prior to visit.   No facility-administered medications prior to visit.    No Known Allergies  Review of Systems  Constitutional:  Negative for chills and fever.  Respiratory:  Negative for shortness of breath.   Cardiovascular:  Negative for chest pain, palpitations and leg swelling.  Gastrointestinal:  Negative for abdominal pain, constipation, diarrhea, nausea and vomiting.  Genitourinary:  Negative for dysuria, frequency and urgency.  Neurological:  Negative for dizziness and focal weakness.       Objective:    Physical Exam Constitutional:      General: She is not in acute distress.    Appearance: She is obese. She is not ill-appearing.  Eyes:     Extraocular Movements: Extraocular movements intact.     Conjunctiva/sclera: Conjunctivae normal.  Cardiovascular:     Rate and Rhythm: Normal rate.  Pulmonary:     Effort: Pulmonary effort is normal.  Musculoskeletal:  Cervical back: Normal range of motion and neck supple.  Skin:    General: Skin is warm and dry.  Neurological:     General: No focal deficit present.     Mental Status: She is alert and oriented to person, place, and time.  Psychiatric:        Mood and Affect: Mood normal.        Behavior: Behavior normal.        Thought Content: Thought content normal.      BP 118/76 (BP Location: Left Arm, Patient Position: Sitting, Cuff Size: Large)   Pulse 64   Temp 97.8 F (36.6 C) (Temporal)   Ht 5\' 2"  (1.575 m)   Wt 242 lb (109.8 kg)   SpO2 99%   Breastfeeding No   BMI 44.26 kg/m  Wt Readings from Last 3 Encounters:  10/05/23 242 lb (109.8 kg)  09/29/23 242 lb (109.8 kg)  07/06/23 260 lb (117.9 kg)        Assessment & Plan:   Problem List Items Addressed This Visit   None Visit Diagnoses     Severe obesity (BMI >= 40) (HCC)    -  Primary   Relevant Medications   Semaglutide-Weight Management (WEGOVY) 0.5 MG/0.5ML SOAJ   Encounter prior to initiation of medication          She is here requesting to start a GLP-1.  Counseling on how to administer the medication and potential side effects.  Counseling on staying hydrated, eating small frequent meals, low-fat, and keeping bowel movements regular.  She will follow-up in 3 months or sooner if needed.  We may also consider referral to [facility]  I am having [name] start on Wegovy.  Meds ordered this encounter  Medications   Semaglutide-Weight Management (WEGOVY) 0.5 MG/0.5ML SOAJ    Sig: Inject 0.5 mg into the skin once a week.    Dispense:  2 mL    Refill:  2    Order Specific Question:   Supervising Provider    Answer:   Hillard Danker A [[id number]]

## 2023-10-12 ENCOUNTER — Other Ambulatory Visit: Payer: Self-pay

## 2023-10-12 ENCOUNTER — Other Ambulatory Visit (HOSPITAL_COMMUNITY): Payer: Self-pay

## 2023-10-12 ENCOUNTER — Telehealth: Payer: Self-pay | Admitting: Pharmacist

## 2023-10-12 NOTE — Telephone Encounter (Signed)
Pharmacy Patient Advocate Encounter   Received notification from CoverMyMeds that prior authorization for [facility] 0.5MG /0.5ML auto-injectors is required/requested.   Insurance verification completed.   The patient is insured through [facility] MEDICAID .   Per test claim: PA required; PA submitted to [place] MEDICAID via CoverMyMeds Key/confirmation #/EOC [id number] Status is pending

## 2023-10-13 ENCOUNTER — Other Ambulatory Visit: Payer: Self-pay

## 2023-10-13 ENCOUNTER — Other Ambulatory Visit (HOSPITAL_COMMUNITY): Payer: Self-pay

## 2023-10-13 NOTE — Telephone Encounter (Signed)
Pharmacy Patient Advocate Encounter  Received notification from [facility] that Prior Authorization for Wegovy 0.5MG /0.5ML auto-injectors has been APPROVED from 10/11/2024 to 10/09/2024. Ran test claim, Copay is $4.00. This test claim was processed through [facility]- copay amounts may vary at other pharmacies due to pharmacy/plan contracts, or as the patient moves through the different stages of their insurance plan.   PA #/Case ID/Reference #: [id number]

## 2023-10-14 ENCOUNTER — Other Ambulatory Visit: Payer: Self-pay

## 2023-10-27 ENCOUNTER — Encounter: Payer: Self-pay | Admitting: Family Medicine

## 2023-11-07 ENCOUNTER — Other Ambulatory Visit: Payer: Self-pay

## 2023-12-05 ENCOUNTER — Other Ambulatory Visit: Payer: Self-pay

## 2024-01-04 ENCOUNTER — Other Ambulatory Visit: Payer: Self-pay

## 2024-01-04 ENCOUNTER — Ambulatory Visit: Payer: Medicaid Other | Admitting: Family Medicine

## 2024-01-04 ENCOUNTER — Encounter: Payer: Self-pay | Admitting: Family Medicine

## 2024-01-04 DIAGNOSIS — Z8759 Personal history of other complications of pregnancy, childbirth and the puerperium: Secondary | ICD-10-CM

## 2024-01-04 DIAGNOSIS — E7800 Pure hypercholesterolemia, unspecified: Secondary | ICD-10-CM | POA: Diagnosis not present

## 2024-01-04 MED ORDER — WEGOVY 1 MG/0.5ML ~~LOC~~ SOAJ
1.0000 mg | SUBCUTANEOUS | 3 refills | Status: DC
Start: 2024-01-04 — End: 2024-04-26
  Filled 2024-01-04: qty 2, 28d supply, fill #0
  Filled 2024-01-27: qty 2, 28d supply, fill #1
  Filled 2024-02-27: qty 2, 28d supply, fill #2
  Filled 2024-03-28: qty 2, 28d supply, fill #3

## 2024-01-04 NOTE — Progress Notes (Signed)
 Subjective:     Patient ID: Kim Mejia, female    DOB: [birth year], 23 y.o.   MRN: [id number]  Chief Complaint  Patient presents with   Follow-up    F/u on wegovy , may want to increase as hunger has started to come back    HPI   History of Present Illness          Here to follow up on obesity and weight loss medication. Reports doing well. She is eating a low fat diet and limiting fatty foods.  Staying hydrated.   Lost 21 lbs since October 2024.   States more recently she has been feeling hungrier and feels like the medication is not working as well.   Has IUD.     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   Was evaluated by cardiology,  To have further testing, possible popliteal entrapment, consult with vascular surgery, or ortho if results neg    Closed fracture of distal fibula 03/10/2017   Right - broken when kicked during soccer game   Obesity    Pregnancy induced hypertension     Past Surgical History:  Procedure Laterality Date   FRACTURE SURGERY  2018   ORIF FIBULA FRACTURE Right 03/14/2017   Procedure: RIGHT OPEN REDUCTION INTERNAL FIXATION LATERAL MALLEOLUS;  Surgeon: Oneil JAYSON Herald, MD;  Location: [facility] OR;  Service: Orthopedics;  Laterality: Right;    Family History  Problem Relation Age of Onset   Hyperlipidemia Mother     Social History   Socioeconomic History   Marital status: Single    Spouse name: Not on file   Number of children: Not on file   Years of education: Not on file   Highest education level: Not on file  Occupational History   Occupation: unemployed  Tobacco Use   Smoking status: Never   Smokeless tobacco: Never   Tobacco comments:    father smokes, visits on weekends  Vaping Use   Vaping status: Never Used  Substance and Sexual Activity   Alcohol  use: No   Drug use: No   Sexual activity: Yes    Partners: Male    Birth control/protection: I.U.D.  Other Topics Concern   Not on file  Social History Narrative   Not on file   Social Drivers of Health   Financial Resource Strain: Not on file  Food Insecurity: Not on file  Transportation Needs: Not on file  Physical Activity: Not on file  Stress: Not on file  Social Connections: Not on file  Intimate Partner Violence: Not on file    Outpatient Medications Prior to Visit  Medication Sig Dispense Refill   Semaglutide -Weight Management (WEGOVY ) 0.5 MG/0.5ML SOAJ Inject 0.5 mg into the skin once a week. 2 mL 2   No facility-administered medications prior to visit.    No Known Allergies  Review of Systems  Constitutional:  Positive for weight loss. Negative for chills and fever.       Intentional   Respiratory:  Negative for shortness of breath.   Cardiovascular:  Negative for chest pain, palpitations and leg swelling.  Gastrointestinal:  Negative for abdominal pain, constipation, diarrhea, nausea and vomiting.  Genitourinary:  Negative for dysuria, frequency and urgency.  Neurological:  Negative for dizziness and focal weakness.  Psychiatric/Behavioral:  Negative  for depression and suicidal ideas. The patient is not nervous/anxious.        Objective:    Physical Exam Constitutional:      General: She is not in acute distress.    Appearance: She is not ill-appearing.  Eyes:     Extraocular Movements: Extraocular movements intact.     Conjunctiva/sclera: Conjunctivae normal.  Cardiovascular:     Rate and Rhythm: Normal rate.  Pulmonary:     Effort: Pulmonary effort is normal.  Musculoskeletal:     Cervical back: Normal range of motion and neck supple.  Skin:    General: Skin is warm and dry.  Neurological:     General: No focal deficit present.     Mental Status: She is alert and oriented to person, place, and time.  Psychiatric:        Mood and Affect: Mood  normal.        Behavior: Behavior normal.        Thought Content: Thought content normal.      BP 110/80 (BP Location: Left Arm, Patient Position: Sitting, Cuff Size: Large)   Pulse 70   Temp 97.6 F (36.4 C) (Temporal)   Ht 5' 2 (1.575 m)   Wt 221 lb (100.2 kg)   SpO2 96%   BMI 40.42 kg/m  Wt Readings from Last 3 Encounters:  01/04/24 221 lb (100.2 kg)  10/05/23 242 lb (109.8 kg)  09/29/23 242 lb (109.8 kg)       Assessment & Plan:   Problem List Items Addressed This Visit     History of gestational hypertension   Pure hypercholesterolemia   Severe obesity (BMI >= 40) (HCC) - Primary   Relevant Medications   Semaglutide -Weight Management (WEGOVY ) 1 MG/0.5ML SOAJ   She has been successful and lost 21 lbs since being on Wegovy  but her current dose is not as effective at controlling her appetite as of the past few weeks.  Increase Wegovy  to 1 mg weekly. She is doing well on the medication, staying hydrated and eating a low fat diet. Eating small meals.  Has IUD and aware the medication is not safe if she desires pregnancy.  Follow up and recheck labs in 3 months.   I have discontinued Bryssa Detert's Wegovy . I am also having her start on Wegovy .  Meds ordered this encounter  Medications   Semaglutide -Weight Management (WEGOVY ) 1 MG/0.5ML SOAJ    Sig: Inject 1 mg into the skin once a week.    Dispense:  2 mL    Refill:  3    Supervising Provider:   ROLLENE NORRIS A [[id number]]

## 2024-01-04 NOTE — Patient Instructions (Signed)
 Increase your dose to 1 mg of Wegovy weekly. Continue to stay hydrated, eat small frequent meals and avoid foods high in fat.   Follow up in 3 months or sooner if needed.

## 2024-01-24 ENCOUNTER — Other Ambulatory Visit: Payer: Self-pay

## 2024-01-24 ENCOUNTER — Emergency Department (HOSPITAL_BASED_OUTPATIENT_CLINIC_OR_DEPARTMENT_OTHER)
Admission: EM | Admit: 2024-01-24 | Discharge: 2024-01-24 | Disposition: A | Discharge status: Home / Self Care | Payer: Medicaid Other | Attending: Emergency Medicine | Admitting: Emergency Medicine

## 2024-01-24 ENCOUNTER — Encounter (HOSPITAL_BASED_OUTPATIENT_CLINIC_OR_DEPARTMENT_OTHER): Payer: Self-pay

## 2024-01-24 DIAGNOSIS — M545 Low back pain, unspecified: Secondary | ICD-10-CM | POA: Insufficient documentation

## 2024-01-24 DIAGNOSIS — M549 Dorsalgia, unspecified: Secondary | ICD-10-CM | POA: Diagnosis present

## 2024-01-24 DIAGNOSIS — M5459 Other low back pain: Secondary | ICD-10-CM | POA: Diagnosis not present

## 2024-01-24 MED ORDER — ACETAMINOPHEN 500 MG PO TABS
1000.0000 mg | ORAL_TABLET | Freq: Once | ORAL | Status: AC
  Administered 2024-01-24: 1000 mg via ORAL
  Filled 2024-01-24: qty 2

## 2024-01-24 MED ORDER — DIAZEPAM 2 MG PO TABS
2.0000 mg | ORAL_TABLET | Freq: Once | ORAL | Status: AC
  Administered 2024-01-24: 2 mg via ORAL
  Filled 2024-01-24: qty 1

## 2024-01-24 MED ORDER — KETOROLAC TROMETHAMINE 15 MG/ML IJ SOLN
15.0000 mg | Freq: Once | INTRAMUSCULAR | Status: AC
  Administered 2024-01-24: 15 mg via INTRAMUSCULAR
  Filled 2024-01-24: qty 1

## 2024-01-24 MED ORDER — OXYCODONE HCL 5 MG PO TABS
5.0000 mg | ORAL_TABLET | Freq: Once | ORAL | Status: AC
  Administered 2024-01-24: 5 mg via ORAL
  Filled 2024-01-24: qty 1

## 2024-01-24 NOTE — ED Provider Notes (Signed)
[place] EMERGENCY DEPARTMENT AT [facility] Provider Note   CSN: [id number] Arrival date & time: 01/24/24  1110     History  Chief Complaint  Patient presents with   Back Pain    Kim Mejia is a 23 y.o. female.  23 yo F with a cc of right lower back pain.  This been going since earlier today.  Bent to do something and felt a sudden sharp pain.  Worse with bending twisting standing ambulation.  She did recently move some things around her bedroom a couple days ago.  Had some upper back pain after that but denied any specific injury.  She denies loss of bowel or bladder denies loss of rectal sensation he does feel like the leg is a little bit weird when she stands on its otherwise denies numbness or weakness.   Back Pain      Home Medications Prior to Admission medications   Medication Sig Start Date End Date Taking? Authorizing Provider  Semaglutide-Weight Management (WEGOVY) 1 MG/0.5ML SOAJ Inject 1 mg into the skin once a week. 01/04/24  Yes Henson, Vickie L, NP-C  ferrous sulfate 325 (65 FE) MG tablet Take 1 tablet (325 mg total) by mouth daily with breakfast. Patient not taking: Reported on 05/27/2020 05/14/20 06/06/20  Meccariello, Solmon Ice, MD      Allergies    Patient has no known allergies.    Review of Systems   Review of Systems  Musculoskeletal:  Positive for back pain.    Physical Exam Updated Vital Signs BP 125/79 (BP Location: Right Arm)   Pulse 81   Temp 98.3 F (36.8 C)   Resp 17   Ht 5\' 2"  (1.575 m)   Wt 98.4 kg   LMP 12/28/2023   SpO2 98%   Breastfeeding No   BMI 39.69 kg/m  Physical Exam Vitals and nursing note reviewed.  Constitutional:      General: She is not in acute distress.    Appearance: She is well-developed. She is not diaphoretic.  HENT:     Head: Normocephalic and atraumatic.  Eyes:     Pupils: Pupils are equal, round, and reactive to light.  Cardiovascular:     Rate and Rhythm: Normal rate and regular rhythm.      Heart sounds: No murmur heard.    No friction rub. No gallop.  Pulmonary:     Effort: Pulmonary effort is normal.     Breath sounds: No wheezing or rales.  Abdominal:     General: There is no distension.     Palpations: Abdomen is soft.     Tenderness: There is no abdominal tenderness.  Musculoskeletal:        General: No tenderness.     Cervical back: Normal range of motion and neck supple.     Comments: Pulse motor and sensation intact in the right lower extremity.  Reflexes are 2+ and equal.  There is no clonus.  Skin:    General: Skin is warm and dry.  Neurological:     Mental Status: She is alert and oriented to person, place, and time.  Psychiatric:        Behavior: Behavior normal.     ED Results / Procedures / Treatments   Labs (all labs ordered are listed, but only abnormal results are displayed) Labs Reviewed - No data to display  EKG None  Radiology No results found.  Procedures Procedures    Medications Ordered in ED Medications  acetaminophen (TYLENOL)  tablet 1,000 mg (has no administration in time range)  ketorolac (TORADOL) 15 MG/ML injection 15 mg (has no administration in time range)  oxyCODONE (Oxy IR/ROXICODONE) immediate release tablet 5 mg (has no administration in time range)  diazepam (VALIUM) tablet 2 mg (has no administration in time range)    ED Course/ Medical Decision Making/ A&P                                 Medical Decision Making Risk OTC drugs. Prescription drug management.   23 yo F with a chief complaints of right lower back pain.  Sounds musculoskeletal by history and physical.  There is no red flags.  Benign exam.  Will treat supportively.  PCP follow-up.  1:38 PM:  I have discussed the diagnosis/risks/treatment options with the patient and family.  Evaluation and diagnostic testing in the emergency department does not suggest an emergent condition requiring admission or immediate intervention beyond what has been  performed at this time.  They will follow up with PCP. We also discussed returning to the ED immediately if new or worsening sx occur. We discussed the sx which are most concerning (e.g., sudden worsening pain, fever, inability to tolerate by mouth, cauda equina s/sx) that necessitate immediate return. Medications administered to the patient during their visit and any new prescriptions provided to the patient are listed below.  Medications given during this visit Medications  acetaminophen (TYLENOL) tablet 1,000 mg (has no administration in time range)  ketorolac (TORADOL) 15 MG/ML injection 15 mg (has no administration in time range)  oxyCODONE (Oxy IR/ROXICODONE) immediate release tablet 5 mg (has no administration in time range)  diazepam (VALIUM) tablet 2 mg (has no administration in time range)     The patient appears reasonably screen and/or stabilized for discharge and I doubt any other medical condition or other [facility] requiring further screening, evaluation, or treatment in the ED at this time prior to discharge.          Final Clinical Impression(s) / ED Diagnoses Final diagnoses:  Acute right-sided low back pain without sciatica    Rx / DC Orders ED Discharge Orders     None         Melene Plan, DO 01/24/24 1338

## 2024-01-24 NOTE — ED Triage Notes (Signed)
In for eval of lower back pain onset this am. Was bending down then got a shocking pain.

## 2024-01-24 NOTE — Discharge Instructions (Signed)
Your back pain is most likely due to a muscular strain.  There is been a lot of research on back pain, unfortunately the only thing that seems to really help is Tylenol and ibuprofen.  Relative rest is also important to not lift greater than 10 pounds bending or twisting at the waist.  Please follow-up with your family physician.  The other thing that really seems to benefit patients is physical therapy which your doctor may send you for.  Please return to the emergency department for new numbness or weakness to your arms or legs. Difficulty with urinating or urinating or pooping on yourself.  Also if you cannot feel toilet paper when you wipe or get a fever.   Take 4 over the counter ibuprofen tablets 3 times a day or 2 over-the-counter naproxen tablets twice a day for pain. Also take tylenol 1000mg (2 extra strength) four times a day.  Stretches can also help. [email or web address]

## 2024-01-27 ENCOUNTER — Other Ambulatory Visit: Payer: Self-pay

## 2024-02-27 ENCOUNTER — Other Ambulatory Visit: Payer: Self-pay

## 2024-03-02 ENCOUNTER — Other Ambulatory Visit: Payer: Self-pay

## 2024-03-07 ENCOUNTER — Other Ambulatory Visit: Payer: Self-pay

## 2024-03-29 ENCOUNTER — Other Ambulatory Visit: Payer: Self-pay

## 2024-04-18 ENCOUNTER — Ambulatory Visit: Payer: Medicaid Other | Admitting: Family Medicine

## 2024-04-18 ENCOUNTER — Ambulatory Visit: Admitting: Family Medicine

## 2024-04-26 ENCOUNTER — Telehealth: Payer: Self-pay | Admitting: Pharmacy Technician

## 2024-04-26 ENCOUNTER — Encounter: Payer: Self-pay | Admitting: Family Medicine

## 2024-04-26 ENCOUNTER — Encounter (HOSPITAL_BASED_OUTPATIENT_CLINIC_OR_DEPARTMENT_OTHER): Payer: Self-pay

## 2024-04-26 ENCOUNTER — Ambulatory Visit: Admitting: Family Medicine

## 2024-04-26 ENCOUNTER — Other Ambulatory Visit (HOSPITAL_COMMUNITY): Payer: Self-pay

## 2024-04-26 ENCOUNTER — Other Ambulatory Visit (HOSPITAL_BASED_OUTPATIENT_CLINIC_OR_DEPARTMENT_OTHER): Payer: Self-pay

## 2024-04-26 VITALS — BP 104/76 | HR 70 | Temp 97.9°F | Ht 62.0 in | Wt 193.0 lb

## 2024-04-26 DIAGNOSIS — E781 Pure hyperglyceridemia: Secondary | ICD-10-CM | POA: Diagnosis not present

## 2024-04-26 DIAGNOSIS — E7800 Pure hypercholesterolemia, unspecified: Secondary | ICD-10-CM | POA: Diagnosis not present

## 2024-04-26 DIAGNOSIS — E669 Obesity, unspecified: Secondary | ICD-10-CM

## 2024-04-26 DIAGNOSIS — Z6835 Body mass index (BMI) 35.0-35.9, adult: Secondary | ICD-10-CM | POA: Diagnosis not present

## 2024-04-26 DIAGNOSIS — Z79899 Other long term (current) drug therapy: Secondary | ICD-10-CM

## 2024-04-26 DIAGNOSIS — Z8759 Personal history of other complications of pregnancy, childbirth and the puerperium: Secondary | ICD-10-CM | POA: Diagnosis not present

## 2024-04-26 HISTORY — DX: Other long term (current) drug therapy: Z79.899

## 2024-04-26 HISTORY — DX: Pure hyperglyceridemia: E78.1

## 2024-04-26 LAB — COMPREHENSIVE METABOLIC PANEL WITH GFR
ALT: 12 U/L (ref 0–35)
AST: 16 U/L (ref 0–37)
Albumin: 4.5 g/dL (ref 3.5–5.2)
Alkaline Phosphatase: 75 U/L (ref 39–117)
BUN: 10 mg/dL (ref 6–23)
CO2: 27 meq/L (ref 19–32)
Calcium: 9.4 mg/dL (ref 8.4–10.5)
Chloride: 102 meq/L (ref 96–112)
Creatinine, Ser: 0.55 mg/dL (ref 0.40–1.20)
GFR: 128.28 mL/min (ref >60.00)
Glucose, Bld: 86 mg/dL (ref 70–99)
Potassium: 4.3 meq/L (ref 3.5–5.1)
Sodium: 136 meq/L (ref 135–145)
Total Bilirubin: 0.4 mg/dL (ref 0.2–1.2)
Total Protein: 7.5 g/dL (ref 6.0–8.3)

## 2024-04-26 LAB — CBC WITH DIFFERENTIAL/PLATELET
Basophils Absolute: 0 10*3/uL (ref 0.0–0.1)
Basophils Relative: 0.4 % (ref 0.0–3.0)
Eosinophils Absolute: 0.1 10*3/uL (ref 0.0–0.7)
Eosinophils Relative: 1 % (ref 0.0–5.0)
HCT: 39.9 % (ref 36.0–46.0)
Hemoglobin: 13.2 g/dL (ref 12.0–15.0)
Lymphocytes Relative: 23.8 % (ref 12.0–46.0)
Lymphs Abs: 2.1 10*3/uL (ref 0.7–4.0)
MCHC: 33.2 g/dL (ref 30.0–36.0)
MCV: 93.4 fl (ref 78.0–100.0)
Monocytes Absolute: 0.6 10*3/uL (ref 0.1–1.0)
Monocytes Relative: 6.6 % (ref 3.0–12.0)
Neutro Abs: 6 10*3/uL (ref 1.4–7.7)
Neutrophils Relative %: 68.2 % (ref 43.0–77.0)
Platelets: 277 10*3/uL (ref 150.0–400.0)
RBC: 4.26 Mil/uL (ref 3.87–5.11)
RDW: 13 % (ref 11.5–15.5)
WBC: 8.7 10*3/uL (ref 4.0–10.5)

## 2024-04-26 LAB — LIPID PANEL
Cholesterol: 142 mg/dL (ref 0–200)
HDL: 38.5 mg/dL — ABNORMAL LOW (ref >39.00)
LDL Cholesterol: 86 mg/dL (ref 0–99)
NonHDL: 103.34
Total CHOL/HDL Ratio: 4
Triglycerides: 88 mg/dL (ref 0.0–149.0)
VLDL: 17.6 mg/dL (ref 0.0–40.0)

## 2024-04-26 LAB — TSH: TSH: 0.48 u[IU]/mL (ref 0.35–5.50)

## 2024-04-26 MED ORDER — WEGOVY 1.7 MG/0.75ML ~~LOC~~ SOAJ
1.7000 mg | SUBCUTANEOUS | 4 refills | Status: DC
  Filled 2024-04-26 (×2): qty 3, 28d supply, fill #0
  Filled 2024-05-22: qty 3, 28d supply, fill #1
  Filled 2024-05-24: qty 3, 28d supply, fill #0
  Filled 2024-06-19: qty 3, 28d supply, fill #1
  Filled 2024-07-26: qty 3, 28d supply, fill #2
  Filled 2024-08-20: qty 3, 28d supply, fill #3

## 2024-04-26 NOTE — Telephone Encounter (Signed)
 Pharmacy Patient Advocate Encounter   Received notification from CoverMyMeds that prior authorization for Wegovy  1.7MG /0.75ML auto-injectors is required/requested.   Insurance verification completed.   The patient is insured through [facility] .   Per test claim: PA required; PA submitted to above mentioned insurance via CoverMyMeds Key/confirmation #/EOC [id number] Status is pending

## 2024-04-26 NOTE — Progress Notes (Signed)
 Subjective:     Patient ID: Kim Mejia, female    DOB: 16-Sep-2001, 23 y.o.   MRN: [id number]  Chief Complaint  Patient presents with   Medical Management of Chronic Issues    3 month f/u, fasting    HPI   History of Present Illness         She is here to follow-up on elevated lipids for follow up on weight loss.  She is fasting.  She is on Wegovy  and doing well.  Successful weight loss.  Her diet is healthy and she is more active.  She feels better overall.  She is having cravings return and is ready to increase her dose.  No new concerns.  She has an IUD and is not breastfeeding.    Denies hx of pancreatitis. No personal or family hx of thyroid cancer.     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   Was evaluated by cardiology,  To have further testing, possible popliteal entrapment, consult with vascular surgery, or ortho if results neg    Closed fracture of distal fibula 03/10/2017   Right - broken when kicked during soccer game   Hypertriglyceridemia 04/26/2024   Medication management 04/26/2024   Obesity    Pregnancy induced hypertension     Past Surgical History:  Procedure Laterality Date   FRACTURE SURGERY  2018   ORIF FIBULA FRACTURE Right 03/14/2017   Procedure: RIGHT OPEN REDUCTION INTERNAL FIXATION LATERAL MALLEOLUS;  Surgeon: Adah Acron, MD;  Location: [facility] OR;  Service: Orthopedics;  Laterality: Right;    Family History  Problem Relation Age of Onset   Hyperlipidemia Mother     Social History   Socioeconomic History   Marital status: Single    Spouse name: Not on file   Number of children: Not on file   Years of education: Not on file   Highest education level: Not on file  Occupational History   Occupation: unemployed  Tobacco Use   Smoking status: Never   Smokeless  tobacco: Never   Tobacco comments:    father smokes, visits on weekends  Vaping Use   Vaping status: Never Used  Substance and Sexual Activity   Alcohol use: No   Drug use: No   Sexual activity: Yes    Partners: Male    Birth control/protection: I.U.D.  Other Topics Concern   Not on file  Social History Narrative   Not on file   Social Drivers of Health   Financial Resource Strain: Not on file  Food Insecurity: Not on file  Transportation Needs: Not on file  Physical Activity: Not on file  Stress: Not on file  Social Connections: Not on file  Intimate Partner Violence: Not on file    Outpatient Medications Prior to Visit  Medication Sig Dispense Refill   Semaglutide -Weight Management (WEGOVY ) 1 MG/0.5ML SOAJ Inject 1 mg into the skin once a week. 2 mL 3   No facility-administered medications prior to visit.    No Known Allergies  Review of Systems  Constitutional:  Positive for weight loss. Negative for chills, fever and malaise/fatigue.       Intentional   Respiratory:  Negative for shortness of breath.   Cardiovascular:  Negative for chest pain, palpitations and leg swelling.  Gastrointestinal:  Negative  for abdominal pain, constipation, diarrhea, nausea and vomiting.  Genitourinary:  Negative for dysuria, frequency and urgency.  Neurological:  Negative for dizziness, focal weakness and headaches.  Psychiatric/Behavioral:  Negative for depression. The patient is not nervous/anxious.        Objective:    Physical Exam Constitutional:      General: She is not in acute distress.    Appearance: She is not ill-appearing.  Eyes:     Extraocular Movements: Extraocular movements intact.     Conjunctiva/sclera: Conjunctivae normal.  Cardiovascular:     Rate and Rhythm: Normal rate and regular rhythm.  Pulmonary:     Effort: Pulmonary effort is normal.     Breath sounds: Normal breath sounds.  Musculoskeletal:     Cervical back: Normal range of motion and neck  supple.     Right lower leg: No edema.     Left lower leg: No edema.  Skin:    General: Skin is warm and dry.  Neurological:     General: No focal deficit present.     Mental Status: She is alert and oriented to person, place, and time.     Motor: No weakness.     Coordination: Coordination normal.     Gait: Gait normal.  Psychiatric:        Mood and Affect: Mood normal.        Behavior: Behavior normal.        Thought Content: Thought content normal.      BP 104/76 (BP Location: Left Arm, Patient Position: Sitting)   Pulse 70   Temp 97.9 F (36.6 C) (Temporal)   Ht 5\' 2"  (1.575 m)   Wt 193 lb (87.5 kg)   SpO2 100%   BMI 35.30 kg/m  Wt Readings from Last 3 Encounters:  04/26/24 193 lb (87.5 kg)  01/24/24 217 lb (98.4 kg)  01/04/24 221 lb (100.2 kg)       Assessment & Plan:   Problem List Items Addressed This Visit     History of gestational hypertension   Hypertriglyceridemia   Relevant Orders   CBC with Differential/Platelet   Comprehensive metabolic panel with GFR   Lipid panel   Medication management   Relevant Orders   CBC with Differential/Platelet   Comprehensive metabolic panel with GFR   Lipid panel   TSH   Obesity (BMI 30-39.9)   Relevant Medications   Semaglutide -Weight Management (WEGOVY ) 1.7 MG/0.75ML SOAJ   Other Relevant Orders   CBC with Differential/Platelet   Comprehensive metabolic panel with GFR   Lipid panel   TSH   Pure hypercholesterolemia - Primary   Relevant Orders   Lipid panel    Congratulated her on weight loss.  She will continue eating a healthy, low carbohydrate, low-fat diet and getting adequate exercise.  Increase Wegovy  to 1.4 mg weekly.  She will let me know if she has any side effects. Check fasting lipids today and other labs. Follow-up in 4 months or sooner pending lab results.  I have discontinued Lameshia Weidemann's Wegovy . I am also having her start on Wegovy .  Meds ordered this encounter  Medications    Semaglutide -Weight Management (WEGOVY ) 1.7 MG/0.75ML SOAJ    Sig: Inject 1.7 mg into the skin once a week.    Dispense:  3 mL    Refill:  4    Supervising Provider:   Bambi Lever A [[id number]]

## 2024-04-26 NOTE — Telephone Encounter (Signed)
 Pharmacy Patient Advocate Encounter  Received notification from [facility] that Prior Authorization for Wegovy  1.7MG /0.75ML auto-injectors has been APPROVED from 04/26/2024 to 04/26/2025. Ran test claim, Copay is $4.00. This test claim was processed through [facility]- copay amounts may vary at other pharmacies due to pharmacy/plan contracts, or as the patient moves through the different stages of their insurance plan.   PA #/Case ID/Reference #: [id number]

## 2024-04-26 NOTE — Patient Instructions (Signed)
 Please go downstairs for labs before you leave.  Continue a healthy diet and getting adequate exercise.  I increased your dose of Wegovy  today.  Will be in touch with your results.  Follow-up in 4 months or sooner if needed.

## 2024-04-27 ENCOUNTER — Other Ambulatory Visit (HOSPITAL_BASED_OUTPATIENT_CLINIC_OR_DEPARTMENT_OTHER): Payer: Self-pay

## 2024-04-27 ENCOUNTER — Other Ambulatory Visit (HOSPITAL_COMMUNITY): Payer: Self-pay

## 2024-05-24 ENCOUNTER — Other Ambulatory Visit: Payer: Self-pay

## 2024-05-24 ENCOUNTER — Other Ambulatory Visit (HOSPITAL_BASED_OUTPATIENT_CLINIC_OR_DEPARTMENT_OTHER): Payer: Self-pay

## 2024-05-24 ENCOUNTER — Other Ambulatory Visit (HOSPITAL_COMMUNITY): Payer: Self-pay

## 2024-06-22 ENCOUNTER — Other Ambulatory Visit: Payer: Self-pay

## 2024-07-27 ENCOUNTER — Other Ambulatory Visit: Payer: Self-pay

## 2024-08-08 ENCOUNTER — Ambulatory Visit: Admitting: Family Medicine

## 2024-08-14 ENCOUNTER — Ambulatory Visit: Admitting: Family Medicine

## 2024-08-21 ENCOUNTER — Other Ambulatory Visit: Payer: Self-pay

## 2024-08-21 MED ORDER — IBUPROFEN 800 MG PO TABS
800.0000 mg | ORAL_TABLET | Freq: Three times a day (TID) | ORAL | 0 refills | Status: AC | PRN
  Filled 2024-08-21: qty 20, 7d supply, fill #0

## 2024-08-21 MED ORDER — AMOXICILLIN 500 MG PO CAPS
500.0000 mg | ORAL_CAPSULE | Freq: Three times a day (TID) | ORAL | 0 refills | Status: DC
  Filled 2024-08-21: qty 21, 7d supply, fill #0

## 2024-08-21 MED ORDER — CHLORHEXIDINE GLUCONATE 0.12 % MT SOLN
OROMUCOSAL | 0 refills | Status: AC
  Filled 2024-08-21: qty 473, 16d supply, fill #0

## 2024-08-22 ENCOUNTER — Ambulatory Visit: Admitting: Family Medicine

## 2024-08-23 ENCOUNTER — Other Ambulatory Visit: Payer: Self-pay

## 2024-08-23 ENCOUNTER — Ambulatory Visit: Admitting: Family Medicine

## 2024-08-23 ENCOUNTER — Encounter: Payer: Self-pay | Admitting: Family Medicine

## 2024-08-23 VITALS — BP 102/64 | HR 79 | Temp 97.6°F | Ht 62.0 in | Wt 179.0 lb

## 2024-08-23 DIAGNOSIS — L659 Nonscarring hair loss, unspecified: Secondary | ICD-10-CM

## 2024-08-23 DIAGNOSIS — E669 Obesity, unspecified: Secondary | ICD-10-CM | POA: Diagnosis not present

## 2024-08-23 MED ORDER — WEGOVY 1.7 MG/0.75ML ~~LOC~~ SOAJ
1.7000 mg | SUBCUTANEOUS | 2 refills | Status: DC
  Filled 2024-09-20: qty 3, 28d supply, fill #0
  Filled 2024-10-22 – 2024-11-02 (×3): qty 3, 28d supply, fill #1

## 2024-08-23 NOTE — Progress Notes (Signed)
 Subjective:     Patient ID: Kim Mejia, female    DOB: [birth year], 23 y.o.   MRN: [id number]  Chief Complaint  Patient presents with   Medical Management of Chronic Issues    4 month f/u, doing ok on new wegovy  dose    HPI  Discussed the use of AI scribe software for clinical note transcription with the patient, who gave verbal consent to proceed.  History of Present Illness Kim Mejia is a 23 year old female who presents for a follow-up visit regarding obesity management.  Obesity management - Weight loss of approximately 14 pounds since Apr 26, 2024 - Currently receiving Wegovy  1.4 mg weekly - Increased hunger, particularly during the last two to three days of the week - No weight gain over the past two to four weeks  Dietary habits - Consuming two to three meals per day with smaller portions - Increased protein intake - Significantly reduced carbohydrate consumption, avoiding rice and beans - Drinking five to six bottles of water daily  Physical activity - Recent decrease in physical activity - Previously walking 40 minutes daily  Alopecia - Increased hair loss over the past couple of weeks, especially during showering - Not currently taking a multivitamin  Gynecologic history - Regular monthly menstrual cycles - Intrauterine device (IUD) in place     Health Maintenance Due  Topic Date Due   COVID-19 Vaccine (1) Never done   HPV VACCINES (1 - 3-dose series) Never done   CHLAMYDIA SCREENING  02/11/2023   Cervical Cancer Screening (Pap smear)  08/14/2024   INFLUENZA VACCINE  07/27/2024    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   Was evaluated by cardiology,  To have further testing, possible popliteal entrapment, consult with vascular surgery, or ortho if results neg    Closed fracture of distal fibula 03/10/2017   Right - broken when kicked during soccer game   Hypertriglyceridemia 04/26/2024    Medication management 04/26/2024   Obesity    Pregnancy induced hypertension     Past Surgical History:  Procedure Laterality Date   FRACTURE SURGERY  2018   ORIF FIBULA FRACTURE Right 03/14/2017   Procedure: RIGHT OPEN REDUCTION INTERNAL FIXATION LATERAL MALLEOLUS;  Surgeon: Oneil JAYSON Herald, MD;  Location: [facility] OR;  Service: Orthopedics;  Laterality: Right;    Family History  Problem Relation Age of Onset   Hyperlipidemia Mother     Social History   Socioeconomic History   Marital status: Single    Spouse name: Not on file   Number of children: Not on file   Years of education: Not on file   Highest education level: Not on file  Occupational History   Occupation: unemployed  Tobacco Use   Smoking status: Never   Smokeless tobacco: Never   Tobacco comments:    father smokes, visits on weekends  Vaping Use   Vaping status: Never Used  Substance and Sexual Activity   Alcohol use: No   Drug use: No   Sexual activity: Yes    Partners: Male    Birth control/protection: I.U.D.  Other Topics Concern   Not on file  Social History Narrative   Not on file   Social Drivers of Health   Financial Resource Strain: Not on file  Food Insecurity: Not on file  Transportation Needs: Not on file  Physical Activity: Not on file  Stress: Not on file  Social Connections: Not on file  Intimate Partner Violence: Not on file    Outpatient Medications Prior to Visit  Medication Sig Dispense Refill   amoxicillin  (AMOXIL ) 500 MG capsule Take 1 capsule (500 mg total) by mouth every 8 (eight) hours. 21 capsule 0   chlorhexidine  (PERIDEX ) 0.12 % solution SWISH AND SPIT 1 CAPFULL BY MOUTH TWICE A DAY FOR 10 DAYS 473 mL 0   ibuprofen  (ADVIL ) 800 MG tablet Take 1 tablet (800 mg total) by mouth every 8 (eight) hours as needed. 20 tablet 0   Semaglutide -Weight Management (WEGOVY ) 1.7 MG/0.75ML SOAJ Inject 1.7 mg into the skin once a week. 3 mL 4   No facility-administered medications prior to  visit.    No Known Allergies  Review of Systems  Constitutional:  Positive for weight loss. Negative for chills, fever and malaise/fatigue.       On Wegovy   HENT:         Hair thinning x 2 wks  Respiratory:  Negative for shortness of breath.   Cardiovascular:  Negative for chest pain, palpitations and leg swelling.  Gastrointestinal:  Negative for abdominal pain, constipation, diarrhea, nausea and vomiting.  Genitourinary:  Negative for dysuria, frequency and urgency.  Neurological:  Negative for dizziness, focal weakness and headaches.  Psychiatric/Behavioral:  Negative for depression. The patient is not nervous/anxious.        Objective:    Physical Exam Constitutional:      General: She is not in acute distress.    Appearance: She is not ill-appearing.  HENT:     Mouth/Throat:     Mouth: Mucous membranes are moist.     Pharynx: Oropharynx is clear.  Eyes:     Extraocular Movements: Extraocular movements intact.     Conjunctiva/sclera: Conjunctivae normal.  Cardiovascular:     Rate and Rhythm: Normal rate.  Pulmonary:     Effort: Pulmonary effort is normal.  Musculoskeletal:     Cervical back: Normal range of motion and neck supple.  Skin:    General: Skin is warm and dry.  Neurological:     General: No focal deficit present.     Mental Status: She is alert and oriented to person, place, and time.     Motor: No weakness.     Coordination: Coordination normal.     Gait: Gait normal.  Psychiatric:        Mood and Affect: Mood normal.        Behavior: Behavior normal.        Thought Content: Thought content normal.      BP 102/64   Pulse 79   Temp 97.6 F (36.4 C) (Temporal)   Ht 5' 2 (1.575 m)   Wt 179 lb (81.2 kg)   SpO2 97%   BMI 32.74 kg/m  Wt Readings from Last 3 Encounters:  08/23/24 179 lb (81.2 kg)  04/26/24 193 lb (87.5 kg)  01/24/24 217 lb (98.4 kg)       Assessment & Plan:   Problem List Items Addressed This Visit     Obesity (BMI  30-39.9) - Primary   Relevant Medications   semaglutide -weight management (WEGOVY ) 1.7 MG/0.75ML SOAJ SQ injection   Other Visit Diagnoses       Hair thinning           Assessment and Plan Assessment & Plan Obesity Obesity management with Wegovy  1.4 mg is effective, with a 14-pound weight loss since May 2025. Reports some return of hunger towards the end of the week, typical as  the medication wears off. Considering maintaining the current dose for a couple more months before deciding on increasing the dose. Diet includes reduced carbohydrates and increased protein intake. Hydration is adequate, but exercise has decreased recently. Hair thinning noted, potentially related to significant weight loss or nutritional deficiencies. - Continue Wegovy  1.4 mg weekly for 2-3 more months - Encourage continuation of current dietary habits with reduced carbohydrates and increased protein - Encourage regular hydration - Discuss potential increase in Wegovy  dose if weight gain occurs or current dose becomes ineffective  Hair thinning Hair thinning noted over the past few weeks, potentially related to significant weight loss or nutritional deficiencies. No current multivitamin use. Labs were recently checked and were normal, including thyroid  function. Advised that low iron or B12 could contribute to hair thinning. - Start prenatal multivitamin to address potential nutritional deficiencies - Consider lab evaluation for deficiencies if hair thinning persists or worsens     I have changed Trenise Greenlaw's Wegovy . I am also having her maintain her amoxicillin , chlorhexidine , and ibuprofen .  Meds ordered this encounter  Medications   semaglutide -weight management (WEGOVY ) 1.7 MG/0.75ML SOAJ SQ injection    Sig: Inject 1.7 mg into the skin once a week.    Dispense:  3 mL    Refill:  2    Supervising Provider:   ROLLENE NORRIS A [[id number]]

## 2024-08-23 NOTE — Patient Instructions (Signed)
 Start an over the counter multivitamin or prenatal vitamin.   I will see you back in 3 months or sooner if needed.

## 2024-08-28 ENCOUNTER — Ambulatory Visit: Admitting: Family Medicine

## 2024-08-30 ENCOUNTER — Other Ambulatory Visit: Payer: Self-pay

## 2024-09-20 ENCOUNTER — Other Ambulatory Visit: Payer: Self-pay

## 2024-09-24 ENCOUNTER — Other Ambulatory Visit: Payer: Self-pay

## 2024-10-01 ENCOUNTER — Other Ambulatory Visit: Payer: Self-pay

## 2024-10-22 ENCOUNTER — Other Ambulatory Visit: Payer: Self-pay

## 2024-10-29 ENCOUNTER — Other Ambulatory Visit: Payer: Self-pay

## 2024-11-02 ENCOUNTER — Other Ambulatory Visit: Payer: Self-pay

## 2024-11-03 ENCOUNTER — Other Ambulatory Visit: Payer: Self-pay

## 2024-11-05 ENCOUNTER — Other Ambulatory Visit: Payer: Self-pay

## 2024-11-05 ENCOUNTER — Other Ambulatory Visit (HOSPITAL_COMMUNITY): Payer: Self-pay

## 2024-11-05 ENCOUNTER — Telehealth: Payer: Self-pay

## 2024-11-05 NOTE — Telephone Encounter (Signed)

## 2024-11-06 ENCOUNTER — Encounter: Payer: Self-pay | Admitting: Family Medicine

## 2024-11-09 ENCOUNTER — Encounter: Payer: Self-pay | Admitting: Family Medicine

## 2024-11-09 ENCOUNTER — Ambulatory Visit: Admitting: Family Medicine

## 2024-11-09 VITALS — BP 112/66 | HR 70 | Temp 97.8°F | Ht 62.0 in | Wt 179.0 lb

## 2024-11-09 DIAGNOSIS — E7800 Pure hypercholesterolemia, unspecified: Secondary | ICD-10-CM | POA: Diagnosis not present

## 2024-11-09 DIAGNOSIS — Z6832 Body mass index (BMI) 32.0-32.9, adult: Secondary | ICD-10-CM | POA: Diagnosis not present

## 2024-11-09 DIAGNOSIS — E669 Obesity, unspecified: Secondary | ICD-10-CM | POA: Diagnosis not present

## 2024-11-09 NOTE — Progress Notes (Signed)
 Subjective:     Patient ID: Kim Mejia, female    DOB: 2001-06-20, 23 y.o.   MRN: [id number]  Chief Complaint  Patient presents with   Medical Management of Chronic Issues    3 month f/u    HPI  Discussed the use of AI scribe software for clinical note transcription with the patient, who gave verbal consent to proceed.  History of Present Illness Kim Mejia is a 23 year old female who presents for follow-up on chronic health conditions. She is accompanied by her child.  Weight management - Discontinued Wegovy  three weeks ago due to insurance issues after one year of use. - Weight decreased from 242 pounds to 179 pounds during Wegovy  therapy. - Experiences increased hunger since stopping Wegovy .   Lipid profile - Recent laboratory results show improved cholesterol levels.  Preventive care - Current with gynecological care and immunizations. - Has not received a flu shot and does not wish to receive one at this time.     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   Was evaluated by cardiology,  To have further testing, possible popliteal entrapment, consult with vascular surgery, or ortho if results neg    Closed fracture of distal fibula 03/10/2017   Right - broken when kicked during soccer game   Hypertriglyceridemia 04/26/2024   Medication management 04/26/2024   Obesity    Pregnancy induced hypertension     Past Surgical History:  Procedure Laterality Date   FRACTURE SURGERY  2018   ORIF FIBULA FRACTURE Right 03/14/2017   Procedure: RIGHT OPEN REDUCTION INTERNAL FIXATION LATERAL MALLEOLUS;  Surgeon: Oneil JAYSON Herald, MD;  Location: [facility] OR;  Service: Orthopedics;  Laterality:  Right;    Family History  Problem Relation Age of Onset   Hyperlipidemia Mother     Social History   Socioeconomic History   Marital status: Single    Spouse name: Not on file   Number of children: Not on file   Years of education: Not on file   Highest education level: Not on file  Occupational History   Occupation: unemployed  Tobacco Use   Smoking status: Never   Smokeless tobacco: Never   Tobacco comments:    father smokes, visits on weekends  Vaping Use   Vaping status: Never Used  Substance and Sexual Activity   Alcohol use: No   Drug use: No   Sexual activity: Yes    Partners: Male    Birth control/protection: I.U.D.  Other Topics Concern   Not on file  Social History Narrative   Not on file   Social Drivers of Health   Financial Resource Strain: Not on file  Food Insecurity: Not on file  Transportation Needs: Not on file  Physical Activity: Not on file  Stress: Not on file  Social Connections: Not on file  Intimate Partner Violence: Not on file    Outpatient Medications Prior to Visit  Medication Sig Dispense Refill   chlorhexidine  (PERIDEX ) 0.12 % solution SWISH AND SPIT 1 CAPFULL BY MOUTH TWICE A DAY FOR 10 DAYS 473 mL 0   ibuprofen  (ADVIL ) 800 MG tablet Take 1 tablet (800 mg total) by mouth  every 8 (eight) hours as needed. 20 tablet 0   amoxicillin  (AMOXIL ) 500 MG capsule Take 1 capsule (500 mg total) by mouth every 8 (eight) hours. 21 capsule 0   semaglutide -weight management (WEGOVY ) 1.7 MG/0.75ML SOAJ SQ injection Inject 1.7 mg into the skin once a week. 3 mL 2   No facility-administered medications prior to visit.    No Known Allergies  Review of Systems  Constitutional:  Negative for chills, fever, malaise/fatigue and weight loss.  Respiratory:  Negative for shortness of breath.   Cardiovascular:  Negative for chest pain, palpitations and leg swelling.  Gastrointestinal:  Negative for abdominal pain, constipation, diarrhea, nausea and  vomiting.  Genitourinary:  Negative for dysuria, frequency and urgency.  Neurological:  Negative for dizziness, focal weakness and headaches.  Psychiatric/Behavioral:  Negative for depression. The patient is not nervous/anxious.        Objective:    Physical Exam Constitutional:      General: She is not in acute distress.    Appearance: She is not ill-appearing.  Eyes:     Extraocular Movements: Extraocular movements intact.     Conjunctiva/sclera: Conjunctivae normal.  Cardiovascular:     Rate and Rhythm: Normal rate.  Pulmonary:     Effort: Pulmonary effort is normal.  Musculoskeletal:     Cervical back: Normal range of motion and neck supple.  Skin:    General: Skin is warm and dry.  Neurological:     General: No focal deficit present.     Mental Status: She is alert and oriented to person, place, and time.  Psychiatric:        Mood and Affect: Mood normal.        Behavior: Behavior normal.        Thought Content: Thought content normal.      BP 112/66   Pulse 70   Temp 97.8 F (36.6 C) (Temporal)   Ht 5' 2 (1.575 m)   Wt 179 lb (81.2 kg)   SpO2 98%   BMI 32.74 kg/m  Wt Readings from Last 3 Encounters:  11/09/24 179 lb (81.2 kg)  08/23/24 179 lb (81.2 kg)  04/26/24 193 lb (87.5 kg)       Assessment & Plan:   Problem List Items Addressed This Visit   None   Assessment and Plan Assessment & Plan Obesity Management with Wegovy  was effective, resulting in significant weight loss from 242 lbs to 179 lbs. Insurance coverage for Wegovy  has lapsed, and she has been off the medication for approximately three weeks. She reports increased hunger, a common occurrence when off the medication. Lifestyle modifications are crucial to maintain weight loss. - Encouraged lifestyle modifications including portion control, hydration, and mindful eating. - Advised against eating before bed and suggested waiting 10-15 minutes after meals before considering seconds. -  Discussed the option of referral to [facility] program, but she prefers to manage independently. - Wegovy  0.25 mg samples x 8 weeks provided to help with her transition.   Pure hypercholesterolemia Cholesterol levels have improved, likely due to weight loss and lifestyle changes. - Continue current lifestyle modifications to maintain cholesterol levels.     I have discontinued Kim Mejia's amoxicillin  and Wegovy . I am also having her maintain her chlorhexidine  and ibuprofen .  No orders of the defined types were placed in this encounter.

## 2024-11-09 NOTE — Patient Instructions (Signed)
 Remember to stay hydrated and avoid getting seconds unless you wait for at least 10-15 minutes. Avoid overeating and eating late in the evening.

## 2024-11-16 ENCOUNTER — Other Ambulatory Visit: Payer: Self-pay

## 2024-11-21 ENCOUNTER — Ambulatory Visit: Admitting: Family Medicine

## 2024-12-24 ENCOUNTER — Encounter: Payer: Self-pay | Admitting: Family Medicine

## 2025-02-14 ENCOUNTER — Encounter: Admitting: Family Medicine
